# Patient Record
Sex: Female | Born: 1935 | Race: White | Hispanic: No | Smoking: Former smoker
Health system: Southern US, Community
[De-identification: ages and names within clinical notes are randomized; demographics above are authoritative.]

## PROBLEM LIST (undated history)

## (undated) DIAGNOSIS — I509 Heart failure, unspecified: Secondary | ICD-10-CM

## (undated) DIAGNOSIS — J449 Chronic obstructive pulmonary disease, unspecified: Secondary | ICD-10-CM

## (undated) DIAGNOSIS — C801 Malignant (primary) neoplasm, unspecified: Secondary | ICD-10-CM

## (undated) DIAGNOSIS — E119 Type 2 diabetes mellitus without complications: Secondary | ICD-10-CM

## (undated) DIAGNOSIS — I499 Cardiac arrhythmia, unspecified: Secondary | ICD-10-CM

## (undated) DIAGNOSIS — I10 Essential (primary) hypertension: Secondary | ICD-10-CM

## (undated) HISTORY — PX: APPENDECTOMY: SHX54

## (undated) HISTORY — PX: CHOLECYSTECTOMY: SHX55

## (undated) HISTORY — DX: Cardiac arrhythmia, unspecified: I49.9

## (undated) HISTORY — PX: ABDOMINAL SURGERY: SHX537

## (undated) HISTORY — DX: Heart failure, unspecified: I50.9

## (undated) HISTORY — PX: BREAST SURGERY: SHX581

## (undated) HISTORY — PX: BACK SURGERY: SHX140

---

## 2002-12-27 ENCOUNTER — Encounter: Payer: Self-pay | Admitting: Neurosurgery

## 2002-12-27 ENCOUNTER — Encounter: Admission: RE | Admit: 2002-12-27 | Discharge: 2002-12-27 | Payer: Self-pay | Admitting: Neurosurgery

## 2003-01-31 ENCOUNTER — Encounter: Payer: Self-pay | Admitting: Neurosurgery

## 2003-02-03 ENCOUNTER — Ambulatory Visit (HOSPITAL_COMMUNITY): Admission: RE | Admit: 2003-02-03 | Discharge: 2003-02-04 | Payer: Self-pay | Admitting: Neurosurgery

## 2003-02-28 ENCOUNTER — Encounter: Admission: RE | Admit: 2003-02-28 | Discharge: 2003-02-28 | Payer: Self-pay | Admitting: Neurosurgery

## 2003-07-23 ENCOUNTER — Other Ambulatory Visit: Payer: Self-pay

## 2004-01-31 ENCOUNTER — Ambulatory Visit: Payer: Self-pay | Admitting: Anesthesiology

## 2004-02-07 ENCOUNTER — Ambulatory Visit: Payer: Self-pay | Admitting: Anesthesiology

## 2004-02-15 ENCOUNTER — Ambulatory Visit: Payer: Self-pay | Admitting: Cardiology

## 2004-02-27 ENCOUNTER — Ambulatory Visit: Payer: Self-pay | Admitting: Anesthesiology

## 2004-03-20 ENCOUNTER — Ambulatory Visit: Payer: Self-pay

## 2004-03-23 ENCOUNTER — Ambulatory Visit: Payer: Self-pay | Admitting: Gastroenterology

## 2004-03-26 ENCOUNTER — Ambulatory Visit: Payer: Self-pay | Admitting: Internal Medicine

## 2004-03-28 ENCOUNTER — Ambulatory Visit: Payer: Self-pay | Admitting: Anesthesiology

## 2004-04-29 ENCOUNTER — Emergency Department: Payer: Self-pay | Admitting: Emergency Medicine

## 2004-06-04 ENCOUNTER — Ambulatory Visit: Payer: Self-pay | Admitting: Gerontology

## 2004-07-24 ENCOUNTER — Ambulatory Visit: Payer: Self-pay | Admitting: Urology

## 2004-09-20 ENCOUNTER — Ambulatory Visit: Payer: Self-pay | Admitting: Unknown Physician Specialty

## 2004-09-25 ENCOUNTER — Ambulatory Visit: Payer: Self-pay | Admitting: Ophthalmology

## 2004-11-23 ENCOUNTER — Other Ambulatory Visit: Payer: Self-pay

## 2004-12-03 ENCOUNTER — Inpatient Hospital Stay: Payer: Self-pay | Admitting: Unknown Physician Specialty

## 2004-12-07 ENCOUNTER — Encounter: Payer: Self-pay | Admitting: Internal Medicine

## 2005-04-03 ENCOUNTER — Ambulatory Visit: Payer: Self-pay | Admitting: Internal Medicine

## 2005-07-12 ENCOUNTER — Emergency Department: Payer: Self-pay | Admitting: Emergency Medicine

## 2005-07-31 ENCOUNTER — Other Ambulatory Visit: Payer: Self-pay

## 2005-07-31 ENCOUNTER — Inpatient Hospital Stay: Payer: Self-pay | Admitting: Internal Medicine

## 2005-08-20 ENCOUNTER — Ambulatory Visit: Payer: Self-pay | Admitting: Internal Medicine

## 2005-10-28 ENCOUNTER — Inpatient Hospital Stay: Payer: Self-pay | Admitting: Unknown Physician Specialty

## 2006-01-24 ENCOUNTER — Inpatient Hospital Stay: Payer: Self-pay | Admitting: Internal Medicine

## 2006-01-24 ENCOUNTER — Other Ambulatory Visit: Payer: Self-pay

## 2006-01-30 ENCOUNTER — Other Ambulatory Visit: Payer: Self-pay

## 2006-02-06 ENCOUNTER — Inpatient Hospital Stay: Payer: Self-pay | Admitting: Unknown Physician Specialty

## 2006-02-11 ENCOUNTER — Encounter: Payer: Self-pay | Admitting: Internal Medicine

## 2006-02-14 ENCOUNTER — Ambulatory Visit: Payer: Self-pay | Admitting: Internal Medicine

## 2006-03-08 ENCOUNTER — Encounter: Payer: Self-pay | Admitting: Internal Medicine

## 2006-04-08 ENCOUNTER — Encounter: Payer: Self-pay | Admitting: Internal Medicine

## 2006-05-21 ENCOUNTER — Ambulatory Visit: Payer: Self-pay | Admitting: Internal Medicine

## 2006-10-06 ENCOUNTER — Ambulatory Visit: Payer: Self-pay | Admitting: Unknown Physician Specialty

## 2007-05-15 ENCOUNTER — Other Ambulatory Visit: Payer: Self-pay

## 2007-05-15 ENCOUNTER — Emergency Department: Payer: Self-pay | Admitting: Emergency Medicine

## 2007-06-01 ENCOUNTER — Ambulatory Visit: Payer: Self-pay | Admitting: Internal Medicine

## 2007-06-28 ENCOUNTER — Emergency Department: Payer: Self-pay | Admitting: Emergency Medicine

## 2007-07-22 ENCOUNTER — Ambulatory Visit: Payer: Self-pay | Admitting: Internal Medicine

## 2007-07-23 ENCOUNTER — Emergency Department: Payer: Self-pay | Admitting: Unknown Physician Specialty

## 2007-07-23 ENCOUNTER — Other Ambulatory Visit: Payer: Self-pay

## 2007-08-25 ENCOUNTER — Emergency Department: Payer: Self-pay | Admitting: Emergency Medicine

## 2007-09-04 ENCOUNTER — Ambulatory Visit: Payer: Self-pay | Admitting: Urology

## 2007-09-23 ENCOUNTER — Ambulatory Visit: Payer: Self-pay | Admitting: Cardiology

## 2007-11-17 ENCOUNTER — Emergency Department: Payer: Self-pay | Admitting: Emergency Medicine

## 2007-11-17 ENCOUNTER — Other Ambulatory Visit: Payer: Self-pay

## 2008-05-08 ENCOUNTER — Emergency Department: Payer: Self-pay | Admitting: Emergency Medicine

## 2008-06-22 ENCOUNTER — Ambulatory Visit: Payer: Self-pay | Admitting: Unknown Physician Specialty

## 2008-06-28 ENCOUNTER — Inpatient Hospital Stay: Payer: Self-pay | Admitting: Unknown Physician Specialty

## 2008-07-01 ENCOUNTER — Encounter: Payer: Self-pay | Admitting: Internal Medicine

## 2008-07-07 ENCOUNTER — Encounter: Payer: Self-pay | Admitting: Internal Medicine

## 2008-09-07 ENCOUNTER — Ambulatory Visit: Payer: Self-pay | Admitting: Internal Medicine

## 2008-11-23 ENCOUNTER — Ambulatory Visit: Payer: Self-pay | Admitting: Orthopedic Surgery

## 2008-12-21 ENCOUNTER — Ambulatory Visit: Payer: Self-pay | Admitting: Pain Medicine

## 2009-01-04 ENCOUNTER — Ambulatory Visit: Payer: Self-pay | Admitting: Physician Assistant

## 2009-01-17 ENCOUNTER — Ambulatory Visit: Payer: Self-pay | Admitting: Pain Medicine

## 2009-04-22 ENCOUNTER — Inpatient Hospital Stay: Payer: Self-pay | Admitting: Internal Medicine

## 2009-05-21 ENCOUNTER — Emergency Department: Payer: Self-pay | Admitting: Emergency Medicine

## 2009-07-05 ENCOUNTER — Emergency Department: Payer: Self-pay | Admitting: Emergency Medicine

## 2009-09-12 ENCOUNTER — Ambulatory Visit: Payer: Self-pay | Admitting: Internal Medicine

## 2009-11-22 ENCOUNTER — Inpatient Hospital Stay: Payer: Self-pay | Admitting: Family Medicine

## 2010-02-23 ENCOUNTER — Observation Stay: Payer: Self-pay | Admitting: Rheumatology

## 2010-04-22 ENCOUNTER — Emergency Department: Payer: Self-pay | Admitting: Emergency Medicine

## 2010-07-17 ENCOUNTER — Ambulatory Visit: Payer: Self-pay | Admitting: Internal Medicine

## 2010-07-31 ENCOUNTER — Emergency Department: Payer: Self-pay | Admitting: Emergency Medicine

## 2010-09-15 ENCOUNTER — Inpatient Hospital Stay: Payer: Self-pay | Admitting: Internal Medicine

## 2011-01-11 ENCOUNTER — Ambulatory Visit: Payer: Self-pay | Admitting: Family Medicine

## 2011-02-17 ENCOUNTER — Emergency Department: Payer: Self-pay | Admitting: Emergency Medicine

## 2011-06-28 ENCOUNTER — Observation Stay: Payer: Self-pay | Admitting: Family Medicine

## 2011-06-28 LAB — CBC
HCT: 37.6 % (ref 35.0–47.0)
HGB: 12.2 g/dL (ref 12.0–16.0)
MCH: 29.1 pg (ref 26.0–34.0)
MCHC: 32.5 g/dL (ref 32.0–36.0)
MCV: 90 fL (ref 80–100)
Platelet: 184 10*3/uL (ref 150–440)
RBC: 4.18 10*6/uL (ref 3.80–5.20)
RDW: 16.8 % — ABNORMAL HIGH (ref 11.5–14.5)
WBC: 9.6 10*3/uL (ref 3.6–11.0)

## 2011-06-28 LAB — COMPREHENSIVE METABOLIC PANEL
Albumin: 3.3 g/dL — ABNORMAL LOW (ref 3.4–5.0)
Alkaline Phosphatase: 85 U/L (ref 50–136)
Anion Gap: 11 (ref 7–16)
BUN: 17 mg/dL (ref 7–18)
Bilirubin,Total: 0.8 mg/dL (ref 0.2–1.0)
Calcium, Total: 9.7 mg/dL (ref 8.5–10.1)
Chloride: 106 mmol/L (ref 98–107)
Co2: 24 mmol/L (ref 21–32)
Creatinine: 1.12 mg/dL (ref 0.60–1.30)
Glucose: 130 mg/dL — ABNORMAL HIGH (ref 65–99)
Osmolality: 285 (ref 275–301)
Potassium: 3.9 mmol/L (ref 3.5–5.1)
SGOT(AST): 38 U/L — ABNORMAL HIGH (ref 15–37)
SGPT (ALT): 23 U/L
Sodium: 141 mmol/L (ref 136–145)
Total Protein: 6.9 g/dL (ref 6.4–8.2)

## 2011-06-28 LAB — CK TOTAL AND CKMB (NOT AT ARMC)
CK, Total: 34 U/L (ref 21–215)
CK-MB: 0.6 ng/mL (ref 0.5–3.6)

## 2011-06-28 LAB — TROPONIN I
Troponin-I: <0.02 ng/mL
Troponin-I: <0.02 ng/mL

## 2011-06-29 LAB — LIPID PANEL
Cholesterol: 133 mg/dL (ref 0–200)
HDL Cholesterol: 44 mg/dL (ref 40–60)
Ldl Cholesterol, Calc: 63 mg/dL (ref 0–100)
Triglycerides: 130 mg/dL (ref 0–200)
VLDL Cholesterol, Calc: 26 mg/dL (ref 5–40)

## 2011-06-29 LAB — TROPONIN I: Troponin-I: <0.02 ng/mL

## 2011-06-29 LAB — TSH: Thyroid Stimulating Horm: 2 u[IU]/mL

## 2011-07-01 LAB — CBC WITH DIFFERENTIAL/PLATELET
Basophil #: 0 10*3/uL (ref 0.0–0.1)
Basophil %: 0.5 %
Eosinophil #: 0.2 10*3/uL (ref 0.0–0.7)
Eosinophil %: 2.2 %
HCT: 41.1 % (ref 35.0–47.0)
HGB: 13.3 g/dL (ref 12.0–16.0)
Lymphocyte #: 3.4 10*3/uL (ref 1.0–3.6)
Lymphocyte %: 33.1 %
MCH: 28.9 pg (ref 26.0–34.0)
MCHC: 32.3 g/dL (ref 32.0–36.0)
MCV: 89 fL (ref 80–100)
Monocyte #: 0.8 10*3/uL — ABNORMAL HIGH (ref 0.0–0.7)
Monocyte %: 7.9 %
Neutrophil #: 5.8 10*3/uL (ref 1.4–6.5)
Neutrophil %: 56.3 %
Platelet: 188 10*3/uL (ref 150–440)
RBC: 4.6 10*6/uL (ref 3.80–5.20)
RDW: 16.7 % — ABNORMAL HIGH (ref 11.5–14.5)
WBC: 10.3 10*3/uL (ref 3.6–11.0)

## 2011-07-01 LAB — BASIC METABOLIC PANEL
Anion Gap: 11 (ref 7–16)
BUN: 31 mg/dL — ABNORMAL HIGH (ref 7–18)
Calcium, Total: 9.5 mg/dL (ref 8.5–10.1)
Chloride: 100 mmol/L (ref 98–107)
Co2: 28 mmol/L (ref 21–32)
Creatinine: 1.32 mg/dL — ABNORMAL HIGH (ref 0.60–1.30)
EGFR (African American): 50 — ABNORMAL LOW
EGFR (Non-African Amer.): 41 — ABNORMAL LOW
Glucose: 140 mg/dL — ABNORMAL HIGH (ref 65–99)
Osmolality: 286 (ref 275–301)
Potassium: 3.4 mmol/L — ABNORMAL LOW (ref 3.5–5.1)
Sodium: 139 mmol/L (ref 136–145)

## 2011-07-31 ENCOUNTER — Ambulatory Visit: Payer: Self-pay | Admitting: Family Medicine

## 2012-02-04 ENCOUNTER — Ambulatory Visit: Payer: Self-pay | Admitting: Family Medicine

## 2012-03-20 ENCOUNTER — Ambulatory Visit: Payer: Self-pay | Admitting: Family Medicine

## 2012-05-06 ENCOUNTER — Emergency Department: Payer: Self-pay | Admitting: Emergency Medicine

## 2012-05-06 LAB — COMPREHENSIVE METABOLIC PANEL
Albumin: 3.4 g/dL (ref 3.4–5.0)
Alkaline Phosphatase: 116 U/L (ref 50–136)
Anion Gap: 7 (ref 7–16)
BUN: 11 mg/dL (ref 7–18)
Bilirubin,Total: 0.8 mg/dL (ref 0.2–1.0)
Calcium, Total: 9.1 mg/dL (ref 8.5–10.1)
Chloride: 106 mmol/L (ref 98–107)
Co2: 27 mmol/L (ref 21–32)
Creatinine: 1.06 mg/dL (ref 0.60–1.30)
EGFR (African American): 58 — ABNORMAL LOW
EGFR (Non-African Amer.): 50 — ABNORMAL LOW
Glucose: 130 mg/dL — ABNORMAL HIGH (ref 65–99)
Osmolality: 281 (ref 275–301)
Potassium: 3.7 mmol/L (ref 3.5–5.1)
SGOT(AST): 21 U/L (ref 15–37)
SGPT (ALT): 18 U/L (ref 12–78)
Sodium: 140 mmol/L (ref 136–145)
Total Protein: 6.9 g/dL (ref 6.4–8.2)

## 2012-05-06 LAB — CBC
HCT: 41 % (ref 35.0–47.0)
HGB: 13.4 g/dL (ref 12.0–16.0)
MCH: 29.1 pg (ref 26.0–34.0)
MCHC: 32.8 g/dL (ref 32.0–36.0)
MCV: 89 fL (ref 80–100)
Platelet: 290 10*3/uL (ref 150–440)
RBC: 4.62 10*6/uL (ref 3.80–5.20)
RDW: 17.3 % — ABNORMAL HIGH (ref 11.5–14.5)
WBC: 9.6 10*3/uL (ref 3.6–11.0)

## 2012-05-06 LAB — PRO B NATRIURETIC PEPTIDE: B-Type Natriuretic Peptide: 148 pg/mL (ref 0–450)

## 2012-05-06 LAB — TROPONIN I: Troponin-I: <0.02 ng/mL

## 2012-05-06 LAB — CK TOTAL AND CKMB (NOT AT ARMC)
CK, Total: 27 U/L (ref 21–215)
CK-MB: <0.5 ng/mL — ABNORMAL LOW (ref 0.5–3.6)

## 2012-05-12 ENCOUNTER — Encounter: Payer: Self-pay | Admitting: Family Medicine

## 2012-12-08 ENCOUNTER — Emergency Department: Payer: Self-pay | Admitting: Emergency Medicine

## 2012-12-08 LAB — COMPREHENSIVE METABOLIC PANEL
Albumin: 3.2 g/dL — ABNORMAL LOW (ref 3.4–5.0)
Alkaline Phosphatase: 125 U/L (ref 50–136)
Anion Gap: 7 (ref 7–16)
BUN: 12 mg/dL (ref 7–18)
Bilirubin,Total: 0.5 mg/dL (ref 0.2–1.0)
Calcium, Total: 9.6 mg/dL (ref 8.5–10.1)
Chloride: 105 mmol/L (ref 98–107)
Co2: 28 mmol/L (ref 21–32)
Creatinine: 0.97 mg/dL (ref 0.60–1.30)
EGFR (African American): >60
EGFR (Non-African Amer.): 55 — ABNORMAL LOW
Glucose: 74 mg/dL (ref 65–99)
Osmolality: 278 (ref 275–301)
Potassium: 3.6 mmol/L (ref 3.5–5.1)
SGOT(AST): 19 U/L (ref 15–37)
SGPT (ALT): 17 U/L (ref 12–78)
Sodium: 140 mmol/L (ref 136–145)
Total Protein: 6.9 g/dL (ref 6.4–8.2)

## 2012-12-08 LAB — CBC WITH DIFFERENTIAL/PLATELET
Basophil #: 0.1 10*3/uL (ref 0.0–0.1)
Basophil %: 0.7 %
Eosinophil #: 0.2 10*3/uL (ref 0.0–0.7)
Eosinophil %: 1.8 %
HCT: 37.2 % (ref 35.0–47.0)
HGB: 12.6 g/dL (ref 12.0–16.0)
Lymphocyte #: 2.7 10*3/uL (ref 1.0–3.6)
Lymphocyte %: 26.4 %
MCH: 28.8 pg (ref 26.0–34.0)
MCHC: 33.7 g/dL (ref 32.0–36.0)
MCV: 86 fL (ref 80–100)
Monocyte #: 0.7 x10 3/mm (ref 0.2–0.9)
Monocyte %: 6.9 %
Neutrophil #: 6.7 10*3/uL — ABNORMAL HIGH (ref 1.4–6.5)
Neutrophil %: 64.2 %
Platelet: 222 10*3/uL (ref 150–440)
RBC: 4.35 10*6/uL (ref 3.80–5.20)
RDW: 17.6 % — ABNORMAL HIGH (ref 11.5–14.5)
WBC: 10.4 10*3/uL (ref 3.6–11.0)

## 2012-12-08 LAB — URINALYSIS, COMPLETE
Bilirubin,UR: NEGATIVE
Blood: NEGATIVE
Glucose,UR: NEGATIVE mg/dL (ref 0–75)
Hyaline Cast: 5
Ketone: NEGATIVE
Leukocyte Esterase: NEGATIVE
Nitrite: NEGATIVE
Ph: 5 (ref 4.5–8.0)
Protein: NEGATIVE
RBC,UR: NONE SEEN /HPF (ref 0–5)
Specific Gravity: 1.017 (ref 1.003–1.030)
Squamous Epithelial: 2
WBC UR: 2 /HPF (ref 0–5)

## 2013-01-11 ENCOUNTER — Emergency Department: Payer: Self-pay | Admitting: Emergency Medicine

## 2013-01-11 LAB — CBC
HCT: 38.8 % (ref 35.0–47.0)
HGB: 12.7 g/dL (ref 12.0–16.0)
MCH: 28.4 pg (ref 26.0–34.0)
MCHC: 32.8 g/dL (ref 32.0–36.0)
MCV: 87 fL (ref 80–100)
Platelet: 191 10*3/uL (ref 150–440)
RBC: 4.47 10*6/uL (ref 3.80–5.20)
RDW: 17.8 % — ABNORMAL HIGH (ref 11.5–14.5)
WBC: 11.5 10*3/uL — ABNORMAL HIGH (ref 3.6–11.0)

## 2013-01-11 LAB — COMPREHENSIVE METABOLIC PANEL
Albumin: 3.5 g/dL (ref 3.4–5.0)
Alkaline Phosphatase: 106 U/L (ref 50–136)
Anion Gap: 6 — ABNORMAL LOW (ref 7–16)
BUN: 17 mg/dL (ref 7–18)
Bilirubin,Total: 0.7 mg/dL (ref 0.2–1.0)
Calcium, Total: 9.8 mg/dL (ref 8.5–10.1)
Chloride: 108 mmol/L — ABNORMAL HIGH (ref 98–107)
Co2: 26 mmol/L (ref 21–32)
Creatinine: 1.29 mg/dL (ref 0.60–1.30)
EGFR (African American): 45 — ABNORMAL LOW
EGFR (Non-African Amer.): 39 — ABNORMAL LOW
Glucose: 108 mg/dL — ABNORMAL HIGH (ref 65–99)
Osmolality: 281 (ref 275–301)
Potassium: 4.7 mmol/L (ref 3.5–5.1)
SGOT(AST): 28 U/L (ref 15–37)
SGPT (ALT): 18 U/L (ref 12–78)
Sodium: 140 mmol/L (ref 136–145)
Total Protein: 6.7 g/dL (ref 6.4–8.2)

## 2013-01-11 LAB — PROTIME-INR
INR: 1
Prothrombin Time: 13.7 secs (ref 11.5–14.7)

## 2013-01-20 ENCOUNTER — Other Ambulatory Visit (HOSPITAL_COMMUNITY): Payer: Self-pay | Admitting: Internal Medicine

## 2013-01-20 DIAGNOSIS — IMO0002 Reserved for concepts with insufficient information to code with codable children: Secondary | ICD-10-CM

## 2013-01-20 DIAGNOSIS — M549 Dorsalgia, unspecified: Secondary | ICD-10-CM

## 2013-01-27 ENCOUNTER — Ambulatory Visit (HOSPITAL_COMMUNITY)
Admission: RE | Admit: 2013-01-27 | Discharge: 2013-01-27 | Disposition: A | Discharge status: Home / Self Care | Payer: Medicare Other | Source: Ambulatory Visit | Attending: Internal Medicine | Admitting: Internal Medicine

## 2013-01-27 DIAGNOSIS — IMO0002 Reserved for concepts with insufficient information to code with codable children: Secondary | ICD-10-CM

## 2013-01-27 DIAGNOSIS — M549 Dorsalgia, unspecified: Secondary | ICD-10-CM

## 2013-02-04 ENCOUNTER — Ambulatory Visit: Payer: Self-pay | Admitting: Family Medicine

## 2013-02-15 ENCOUNTER — Other Ambulatory Visit (HOSPITAL_COMMUNITY): Payer: Self-pay | Admitting: Interventional Radiology

## 2013-02-15 ENCOUNTER — Other Ambulatory Visit: Payer: Self-pay | Admitting: Radiology

## 2013-02-15 DIAGNOSIS — S22000A Wedge compression fracture of unspecified thoracic vertebra, initial encounter for closed fracture: Secondary | ICD-10-CM

## 2013-02-16 ENCOUNTER — Ambulatory Visit (HOSPITAL_COMMUNITY)
Admission: RE | Admit: 2013-02-16 | Discharge: 2013-02-16 | Disposition: A | Discharge status: Home / Self Care | Payer: Medicare Other | Source: Ambulatory Visit | Attending: Interventional Radiology | Admitting: Interventional Radiology

## 2013-02-16 ENCOUNTER — Inpatient Hospital Stay (HOSPITAL_COMMUNITY)
Admission: RE | Admit: 2013-02-16 | Discharge: 2013-02-16 | Disposition: A | Discharge status: Home / Self Care | Payer: Medicare Other | Source: Ambulatory Visit | Attending: Interventional Radiology | Admitting: Interventional Radiology

## 2013-02-16 DIAGNOSIS — M8448XA Pathological fracture, other site, initial encounter for fracture: Secondary | ICD-10-CM | POA: Insufficient documentation

## 2013-02-16 DIAGNOSIS — S22000A Wedge compression fracture of unspecified thoracic vertebra, initial encounter for closed fracture: Secondary | ICD-10-CM

## 2013-02-16 DIAGNOSIS — M5124 Other intervertebral disc displacement, thoracic region: Secondary | ICD-10-CM | POA: Insufficient documentation

## 2013-02-16 DIAGNOSIS — M4804 Spinal stenosis, thoracic region: Secondary | ICD-10-CM | POA: Insufficient documentation

## 2013-10-21 ENCOUNTER — Ambulatory Visit: Payer: Self-pay | Admitting: Cardiology

## 2014-03-01 ENCOUNTER — Ambulatory Visit: Payer: Self-pay | Admitting: Family Medicine

## 2014-04-02 ENCOUNTER — Emergency Department: Payer: Self-pay | Admitting: Emergency Medicine

## 2014-04-02 LAB — BASIC METABOLIC PANEL
Anion Gap: 5 — ABNORMAL LOW (ref 7–16)
BUN: 13 mg/dL (ref 7–18)
CHLORIDE: 102 mmol/L (ref 98–107)
CO2: 30 mmol/L (ref 21–32)
Calcium, Total: 9.4 mg/dL (ref 8.5–10.1)
Creatinine: 1.09 mg/dL (ref 0.60–1.30)
EGFR (African American): >60
EGFR (Non-African Amer.): 51 — ABNORMAL LOW
GLUCOSE: 143 mg/dL — AB (ref 65–99)
Osmolality: 276 (ref 275–301)
Potassium: 3.8 mmol/L (ref 3.5–5.1)
SODIUM: 137 mmol/L (ref 136–145)

## 2014-04-02 LAB — CBC
HCT: 37.3 % (ref 35.0–47.0)
HGB: 11.9 g/dL — ABNORMAL LOW (ref 12.0–16.0)
MCH: 28.3 pg (ref 26.0–34.0)
MCHC: 31.9 g/dL — ABNORMAL LOW (ref 32.0–36.0)
MCV: 89 fL (ref 80–100)
Platelet: 233 10*3/uL (ref 150–440)
RBC: 4.2 10*6/uL (ref 3.80–5.20)
RDW: 17.4 % — ABNORMAL HIGH (ref 11.5–14.5)
WBC: 8.3 10*3/uL (ref 3.6–11.0)

## 2014-04-02 LAB — TROPONIN I: Troponin-I: <0.02 ng/mL

## 2014-07-19 DIAGNOSIS — E780 Pure hypercholesterolemia: Secondary | ICD-10-CM | POA: Diagnosis not present

## 2014-07-19 DIAGNOSIS — I10 Essential (primary) hypertension: Secondary | ICD-10-CM | POA: Diagnosis not present

## 2014-07-19 DIAGNOSIS — E119 Type 2 diabetes mellitus without complications: Secondary | ICD-10-CM | POA: Diagnosis not present

## 2014-07-31 NOTE — H&P (Signed)
PATIENT NAME:  Alexa Lowe, MANTON MR#:  [id number] DATE OF BIRTH:  [birth year]  DATE OF ADMISSION:  06/28/2011  PRIMARY CARE PHYSICIAN: Dion Body, MD   REFERRING PHYSICIAN:   Alger Simons, MD    CHIEF COMPLAINT: Chest pain today.   HISTORY OF PRESENT ILLNESS: The patient is an 81 year old Caucasian female with a history of hypertension, diabetes, coronary artery disease, CVA, chronic obstructive pulmonary disease, osteoarthritis, gastroesophageal reflux disease, gout, depression and anxiety, who presented to the ED with chest pain on the left side since 1:00 p.m. today. The patient is alert, awake, oriented, in no acute distress. She stated that she suddenly had chest pain on the left side when she was watching TV, and the pain is a sharp 6 out of 10 with radiation to the left shoulder and arm with arm numbness. She also has mild shortness of breath but denies any palpitations, orthopnea, or nocturnal dyspnea. She has diffuse bilateral leg swelling on and off and tenderness. She denies any fever or chills. No cough, sputum, or hemoptysis She feels a little bit of nausea but no abdominal pain, vomiting, or diarrhea, no melena. She said she had a cardiac catheterization about five years ago. There was some stenosis, about 40%. She had a stress test about 2 to 3 years ago in her primary care physician's office. The patient is living alone, usually uses a  wheelchair.   PAST MEDICAL HISTORY: As mentioned above.   PAST SURGICAL HISTORY:  1. Bilateral knee replacement.  2. Cholecystectomy.  3. Appendectomy.  4. Tonsillectomy.  5. Hysterectomy.  6. Back surgery x6.  7. Neck surgery   SOCIAL HISTORY: She denies any smoking, drinking, or illicit drugs.   FAMILY HISTORY: Breast cancer. Father had a myocardial infarction. Mother had renal and colon cancer and lung cancer. Sister had breast and ovary cancer.   ALLERGIES: ACE inhibitor, codeine, Dimetapp, erythromycin, morphine, Sinequan.     MEDICATIONS: Please refer to the medication reconciliation list. .   REVIEW OF SYSTEMS: CONSTITUTIONAL: The patient denies any fever or chills. No headache or dizziness. No weight loss. EYES: No double vision, blurred vision or glaucoma. ENT: No postnasal drip, epistaxis, no discharge, no dysphagia. RESPIRATORY: No cough, sputum, shortness of breath, or hematemesis. GASTROINTESTINAL: No abdominal pain, nausea, vomiting, or diarrhea. CARDIOVASCULAR: Positive for chest pain, but no orthopnea, nocturnal dyspnea or edema. GENITOURINARY: No dysuria or hematuria. SKIN: No rash or jaundice. HEMATOLOGY: No bleeding or easy bruising. NEUROLOGICAL: No syncope, loss of consciousness or seizure. PSYCHIATRIC: No depression, no anxiety.   PHYSICAL EXAMINATION:  VITALS: Temperature 96.7, blood pressure 143/68, pulse 72, respirations 18, oxygen saturation 94% on room air.   GENERAL: The patient is alert, awake, oriented, in no acute distress.   HEENT: Pupils are round, equal, and reactive to light and accommodation. Moist oral mucosa. Clear oropharynx.   NECK: Supple, no JVD or carotid bruits. No lymphadenopathy. No thyromegaly.   CARDIOVASCULAR: S1, S2, regular rate and rhythm. No murmurs or gallops.   PULMONARY: Bilateral air entry. No wheezing or rales. No use of muscles to breathe.   ABDOMEN: Soft, no distention or tenderness. No organomegaly. Bowel sounds are present.   EXTREMITIES: No edema, clubbing, or cyanosis. No calf tenderness. Strong bilateral pedal pulses.   NEUROLOGICAL: Alert and oriented x3. No focal deficit. Power five out of five. Sensation intact. Deep tendon reflexes 2+.   LABORATORY, DIAGNOSTIC AND RADIOLOGICAL DATA:  Venous duplex showed no deep venous thrombosis of bilateral legs.  CK 34,  CK-MB 0.6. Troponin less than 0.02. CBC normal. Glucose 130, BUN 17, creatinine 1.12. Electrolytes are normal.  Chest x-ray is unremarkable.  EKG shows sinus rhythm with occasional premature  ventricular contractions, left axis deviation.    IMPRESSION:  1. Chest pain.  2. Coronary artery disease.  3. Hypertension. 4. Diabetes.  5. Chronic obstructive pulmonary disease. 6. Gastroesophageal reflux disease.  7. History of cerebrovascular accident.  8. Osteoarthritis. 9. Gout.   PLAN OF TREATMENT:  1. The patient will be placed for observation. We will continue telemetry monitor, O2 by nasal cannula. We will follow up troponin level x2. We will check lipid panel, TSH.  2. We will get a stress test and a Cardiology consult from [facility].  3. We will give aspirin 325 mg and continue Lipitor and also Lopressor 25 mg p.o. b.i.d. No ACE inhibitor due to allergy. Continue Lasix and continue home medication.  4. GI and deep vein thrombosis prophylaxis.   I discussed the patient's situation and the plan of treatment with the patient. She voiced understanding and agreed to the current treatment plan.   TIME SPENT: About 60 minutes.  ____________________________ Demetrios Loll, MD qc:cbb D: 06/28/2011 19:24:17 ET T: 06/29/2011 08:05:16 ET JOB#: [id number] cc: Demetrios Loll, MD, <Dictator> Dion Body, MD Demetrios Loll MD ELECTRONICALLY SIGNED 06/29/2011 14:21

## 2014-07-31 NOTE — Discharge Summary (Signed)
PATIENT NAME:  Alexa Lowe, Alexa Lowe MR#:  [id number] DATE OF BIRTH:  Nov 11, 1930  DATE OF ADMISSION:  06/28/2011 DATE OF DISCHARGE:  07/01/2011  DISCHARGE DIAGNOSES:  1. Chest discomfort, resolved.  2. Adult onset diabetes.  3. Hypertension.  4. Hyperlipidemia.  5. Osteoarthritis.  6. Anxiety/depression. 7. Mild chronic obstructive pulmonary disease.  8. History of gout.   DISCHARGE MEDICATIONS:  1. Metformin 500 mg p.o. b.i.d.  2. Nabumetone 500 mg p.o. b.i.d.  3. Allopurinol 300 mg p.o. daily.  4. Pantoprazole 40 mg p.o. daily.  5. Lyrica 50 mg p.o. t.i.d.  6. Centrum Silver 1 tab daily.  7. Zantac 150 mg p.o. daily.  8. Albuterol 90-mcg inhaler, 2 puffs every four hours p.r.n. for wheezing.  9. Citalopram 20 mg 1-1/2 tabs p.o. daily.  10. Lipitor 40 mg p.o. at bedtime.  11. Losartan/HCTZ 50/12.5 mg p.o. daily.  12. Aspirin 325 mg p.o. daily.   CONSULTS: Dr. Clayborn Bigness, cardiology.  PROCEDURES: Patient underwent a Myoview. Results are still pending.   PERTINENT LABS DURING HOSPITAL STAY: Cardiac enzymes were negative times three. On the day of discharge sodium 139, potassium 3.4, BUN 31, creatinine 1.32, glucose 140, TSH 2, white blood cell count 10.3, hemoglobin 13.3, platelets 188, calcium 9.5, HDL 44, total cholesterol 133, triglycerides 130, LDL 63.   BRIEF HOSPITAL COURSE:  1. Chest discomfort. The patient initially came in with complaints of chest discomfort radiating to the left arm. EKG did not show any significant changes. Cardiac enzymes were negative times three. Cardiology was consulted and did not recommend invasive procedure per Dr. Clayborn Bigness. A Myoview was initially ordered and completed but the results are still pending upon discharge and will need to be followed up as an outpatient. The patient was symptom-free upon discharge. She does have multiple risk factors and may need cardiology followup as an outpatient if symptoms persist.  2. Hypertension remained stable during  her hospital stay. We will start back on her home regimen of losartan/HCTZ 50/12.5 mg p.o. daily. No need for beta blocker at this time.  If she has any abnormalities on the Myoview may consider adding the beta blocker.  3. Other chronic medical issues remained stable. Continue home regimen.   DISPOSITION: She is in stable condition and will be discharged to home.   FOLLOWUP: Follow up with Dr. Netty Starring in 1 to 2 weeks.   ____________________________ Dion Body, MD kl:bjt D: 07/01/2011 16:42:48 ET T: 07/02/2011 13:34:07 ET JOB#: [id number]  cc: Dion Body, MD, <Dictator> Dion Body MD ELECTRONICALLY SIGNED 07/03/2011 16:47

## 2014-07-31 NOTE — Consult Note (Signed)
Chief Complaint:   Subjective/Chief Complaint Pt states improved symoms but still has some sob/cp. weakness.   VITAL SIGNS/ANCILLARY NOTES: **Vital Signs.:   24-Mar-13 13:59   Temperature Temperature (F) 95.7   Celsius 35.3   Temperature Source axillary   Pulse Pulse 66   Pulse source per Dinamap   Respirations Respirations 20   Systolic BP Systolic BP 008   Diastolic BP (mmHg) Diastolic BP (mmHg) 81   Mean BP 95   BP Source Dinamap   Pulse Ox % Pulse Ox % 95   Pulse Ox Activity Level  At rest   Oxygen Delivery Room Air/ 21 %  *Intake and Output.:   Daily 24-Mar-13 07:00   Grand Totals Intake:  120 Output:      Net:  120 24 Hr.:  120   Oral Intake      In:  120   Length of Stay Totals Intake:  720 Output:      Net:  720   Brief Assessment:   Cardiac Regular  -- LE edema  -- JVD    Respiratory normal resp effort  clear BS  rhonchi    Gastrointestinal Normal    Gastrointestinal details normal Soft  Nontender  Nondistended  No masses palpable   Blood Glucose:  24-Mar-13 07:23    POCT Blood Glucose 102    11:29    POCT Blood Glucose 184    16:37    POCT Blood Glucose 147    20:48    POCT Blood Glucose 254   Radiology Results: XRay:    22-Mar-13 14:56, Chest PA and Lateral   Chest PA and Lateral    REASON FOR EXAM:    Chest Pain  COMMENTS:       PROCEDURE: DXR - DXR CHEST PA (OR AP) AND LATERAL  - Jun 28 2011  2:56PM     RESULT: Comparison is made to the study of September 14, 2010.    The lungs are adequately inflated and clear. The cardiac silhouette is   normal in size. The pulmonary vascularity is not engorged. The   mediastinum is normal in width. There is mild tortuosity of the   descending thoracic aorta. The visualized portions of the thoracic spine   exhibit no compression fracture.    IMPRESSION:  I do not see evidence of acute cardiopulmonary abnormality.      Verified By: DAVID A. Martinique, M.D., MD  [place]:    22-Mar-13 17:13, [place] Color Flow Doppler  Low Extrem Bilat   [place] Color Flow Doppler Low Extrem Bilat    REASON FOR EXAM:    bilat pain swelling  COMMENTS:       PROCEDURE: [place]  - US DOPPLER LOW EXTR BILATERAL  - Jun 28 2011  5:13PM     RESULT: The right and left femoral and popliteal veins are normally   compressible. The waveform patterns are normal and the color flow images   are normal. The response to the augmentation and Valsalva maneuvers is   normal.    IMPRESSION:  I see no evidence of thrombus within the right or left   femoral or popliteal veins.          Verified By: DAVID A. Martinique, M.D., MD   Assessment/Plan:  Invasive Device Daily Assessment of Necessity:   Does the patient currently have any of the following indwelling devices? none   Assessment/Plan:   Assessment IMP CP Angina SOB HTN CAD DM COPD CVA Gout .  Plan PLAN Tele ROMI F/U ekgs ASA Agree with ECHO DM control Bp control Myoview in am I do not rec cath Statin therapy Increase activity Inhalers for sob and copd   Electronic Signatures: Lujean Amel D (MD)  (Signed 25-Mar-13 11:04)  Authored: Chief Complaint, VITAL SIGNS/ANCILLARY NOTES, Brief Assessment, Lab Results, Radiology Results, Assessment/Plan   Last Updated: 25-Mar-13 11:04 by Yolonda Kida (MD)

## 2014-07-31 NOTE — Consult Note (Signed)
PATIENT NAME:  Alexa Lowe, Alexa Lowe MR#:  [id number] DATE OF BIRTH:  [birth year]  DATE OF CONSULTATION:  06/29/2011  REFERRING PHYSICIAN:  Dr. Berdie Ogren Finnell/[facility]  CONSULTING PHYSICIAN:  Jenaye Rickert D. Shakai Dolley, MD  INDICATION: Chest pain.   HISTORY OF PRESENT ILLNESS: Alexa Lowe is a 81 year old with history of hypertension, diabetes, coronary disease, cerebrovascular accident, chronic obstructive pulmonary disease, osteoarthritis, reflux, gout, depression, and anxiety who presented to the Emergency Room with left-sided chest pain that awoke her from sleep. She had chest pain on the left side while watching TV, 6 out of 10, left shoulder numbness. She had mild shortness of breath. Denied palpitations or orthopnea. She had diffuse bilateral leg swelling on and off. Denies any fever or chills. No cough or hemoptysis. She had a cardiac catheterization about five years ago. There was some stenosis of about 40% but she was treated medically. She had a stress test about 2 to 3 years ago and preliminary results were okay. She lives alone. Occasionally uses a wheelchair. With the chest pain she came to the Emergency Room and was admitted.  REVIEW OF SYSTEMS: No blackout spells or syncope. No nausea or vomiting. No fever, chills, or sweats. Denies weight loss, weight gain. No hemoptysis, hematemesis. Denies bright red blood per rectum. No vision change or hearing change. Denies sputum production or cough.   PAST SURGICAL HISTORY:  1. Bilateral knee surgery. 2. Cholecystectomy. 3. Appendectomy.  4. Tonsillectomy.  5. Hysterectomy.  6. Back surgery x6.   PAST MEDICAL HISTORY:  1. Chest pain. 2. Angina. 3. Hypertension. 4. Cerebrovascular accident. 5. Chronic obstructive pulmonary disease. 6. Osteoarthritis. 7. Coronary disease.  8. Diabetes.   FAMILY HISTORY: Breast cancer, myocardial infarction, renal and colon cancer, breast cancer, ovarian cancer.   SOCIAL HISTORY: No smoking or  alcohol consumption. Lives alone.   ALLERGIES: ACE inhibitors, codeine, Dimetapp, erythromycin, morphine, Sinequan.   MEDICATIONS: The patient did not provide a list.   PHYSICAL EXAMINATION:   VITAL SIGNS: Blood pressure 143/68, pulse 72, respiratory rate 18, afebrile.   HEENT: Normocephalic, atraumatic. Pupils equal and reactive to light.   NECK: Supple. No significant JVD, bruits, or adenopathy.   LUNGS: Clear to auscultation and percussion. No significant wheeze, rhonchi, or rales.   HEART: Regular rate and rhythm.   ABDOMEN: Positive bowel sounds. No rebound, guarding, or tenderness.   EXTREMITIES: No cyanosis, clubbing, or edema.   NEUROLOGIC: Intact.   SKIN: Normal.   LABORATORY, DIAGNOSTIC, AND RADIOLOGICAL DATA: Venous Doppler's of the legs were negative. CK 34. MB 0.6. Troponin less than 0.02. CBC normal. Glucose 130, BUN 17, creatinine 1.12. Electrolytes were normal. Chest x-ray negative. EKG normal sinus rhythm, occasional PVCs, left deviation, otherwise normal.   ASSESSMENT:  1. Chest pain. 2. Coronary disease. 3. Hypertension. 4. Diabetes. 5. Chronic obstructive pulmonary disease. 6. Gastroesophageal reflux disease. 7. Cerebrovascular accident. 8. Osteoarthritis. 9. Gout.   PLAN:  1. Agree with admit.  2. Rule out for myocardial infarction.  3. Place on telemetry.  4. Follow-up EKGs.  5. Follow-up cardiac enzymes.  6. Consider functional study.  7. Continue aspirin.  8. Continue statin therapy.  9. Continue blood pressure control.  10. Continue diabetes management.  11. No ACE inhibitor therapy because of allergy. 12. Lasix as needed.  13. Continue reflux medications.  14. Symptoms are fairly atypical and she has had mild to moderate coronary artery disease in the past. Hopefully further work-up will show that things are no different and we can  treat her medically and hopefully have her discharged shortly.   ____________________________ Loran Senters.  Clayborn Bigness, MD ddc:drc D: 07/01/2011 16:30:41 ET T: 07/01/2011 17:19:02 ET JOB#: [id number]  cc: Merry Pond D. Clayborn Bigness, MD, <Dictator> Yolonda Kida MD ELECTRONICALLY SIGNED 07/17/2011 11:41

## 2014-08-05 DIAGNOSIS — E119 Type 2 diabetes mellitus without complications: Secondary | ICD-10-CM | POA: Diagnosis not present

## 2014-08-05 DIAGNOSIS — M545 Low back pain: Secondary | ICD-10-CM | POA: Diagnosis not present

## 2014-08-05 DIAGNOSIS — I10 Essential (primary) hypertension: Secondary | ICD-10-CM | POA: Diagnosis not present

## 2014-08-05 DIAGNOSIS — E780 Pure hypercholesterolemia: Secondary | ICD-10-CM | POA: Diagnosis not present

## 2014-09-09 DIAGNOSIS — C50112 Malignant neoplasm of central portion of left female breast: Secondary | ICD-10-CM | POA: Diagnosis not present

## 2014-09-09 DIAGNOSIS — Z4432 Encounter for fitting and adjustment of external left breast prosthesis: Secondary | ICD-10-CM | POA: Diagnosis not present

## 2014-10-28 ENCOUNTER — Emergency Department
Admission: EM | Admit: 2014-10-28 | Discharge: 2014-10-29 | Disposition: A | Discharge status: Home / Self Care | Payer: Commercial Managed Care - HMO | Attending: Emergency Medicine | Admitting: Emergency Medicine

## 2014-10-28 ENCOUNTER — Emergency Department: Payer: Commercial Managed Care - HMO

## 2014-10-28 ENCOUNTER — Encounter: Payer: Self-pay | Admitting: Emergency Medicine

## 2014-10-28 DIAGNOSIS — R51 Headache: Secondary | ICD-10-CM | POA: Diagnosis not present

## 2014-10-28 DIAGNOSIS — E119 Type 2 diabetes mellitus without complications: Secondary | ICD-10-CM | POA: Insufficient documentation

## 2014-10-28 DIAGNOSIS — Z87891 Personal history of nicotine dependence: Secondary | ICD-10-CM | POA: Insufficient documentation

## 2014-10-28 DIAGNOSIS — I10 Essential (primary) hypertension: Secondary | ICD-10-CM | POA: Insufficient documentation

## 2014-10-28 DIAGNOSIS — Z79899 Other long term (current) drug therapy: Secondary | ICD-10-CM | POA: Diagnosis not present

## 2014-10-28 DIAGNOSIS — G311 Senile degeneration of brain, not elsewhere classified: Secondary | ICD-10-CM | POA: Diagnosis not present

## 2014-10-28 DIAGNOSIS — Z7982 Long term (current) use of aspirin: Secondary | ICD-10-CM | POA: Diagnosis not present

## 2014-10-28 DIAGNOSIS — M47812 Spondylosis without myelopathy or radiculopathy, cervical region: Secondary | ICD-10-CM | POA: Diagnosis not present

## 2014-10-28 DIAGNOSIS — R519 Headache, unspecified: Secondary | ICD-10-CM

## 2014-10-28 HISTORY — DX: Type 2 diabetes mellitus without complications: E11.9

## 2014-10-28 HISTORY — DX: Malignant (primary) neoplasm, unspecified: C80.1

## 2014-10-28 HISTORY — DX: Chronic obstructive pulmonary disease, unspecified: J44.9

## 2014-10-28 HISTORY — DX: Essential (primary) hypertension: I10

## 2014-10-28 LAB — BASIC METABOLIC PANEL
Anion gap: 8 (ref 5–15)
BUN: 19 mg/dL (ref 6–20)
CALCIUM: 9 mg/dL (ref 8.9–10.3)
CHLORIDE: 103 mmol/L (ref 101–111)
CO2: 28 mmol/L (ref 22–32)
CREATININE: 1.06 mg/dL — AB (ref 0.44–1.00)
GFR calc Af Amer: 56 mL/min — ABNORMAL LOW (ref >60)
GFR calc non Af Amer: 48 mL/min — ABNORMAL LOW (ref >60)
Glucose, Bld: 103 mg/dL — ABNORMAL HIGH (ref 65–99)
Potassium: 4.1 mmol/L (ref 3.5–5.1)
SODIUM: 139 mmol/L (ref 135–145)

## 2014-10-28 LAB — CBC WITH DIFFERENTIAL/PLATELET
BASOS PCT: 1 %
Basophils Absolute: 0.1 10*3/uL (ref 0–0.1)
EOS ABS: 0.2 10*3/uL (ref 0–0.7)
Eosinophils Relative: 2 %
HEMATOCRIT: 35 % (ref 35.0–47.0)
Hemoglobin: 11.1 g/dL — ABNORMAL LOW (ref 12.0–16.0)
LYMPHS PCT: 34 %
Lymphs Abs: 2.7 10*3/uL (ref 1.0–3.6)
MCH: 26 pg (ref 26.0–34.0)
MCHC: 31.8 g/dL — ABNORMAL LOW (ref 32.0–36.0)
MCV: 81.8 fL (ref 80.0–100.0)
MONOS PCT: 7 %
Monocytes Absolute: 0.6 10*3/uL (ref 0.2–0.9)
NEUTROS ABS: 4.6 10*3/uL (ref 1.4–6.5)
NEUTROS PCT: 56 %
Platelets: 213 10*3/uL (ref 150–440)
RBC: 4.28 MIL/uL (ref 3.80–5.20)
RDW: 18.3 % — ABNORMAL HIGH (ref 11.5–14.5)
WBC: 8.2 10*3/uL (ref 3.6–11.0)

## 2014-10-28 LAB — SEDIMENTATION RATE: Sed Rate: 41 mm/hr — ABNORMAL HIGH (ref 0–30)

## 2014-10-28 MED ORDER — MAGNESIUM SULFATE 2 GM/50ML IV SOLN: 2.0000 g | Freq: Once | INTRAVENOUS | Status: DC

## 2014-10-28 NOTE — ED Notes (Signed)
Phone given for loved one called

## 2014-10-28 NOTE — ED Provider Notes (Signed)
[facility] Emergency Department Provider Note  ____________________________________________  Time seen: Approximately 9:47 PM  I have reviewed the triage vital signs and the nursing notes.   HISTORY  Chief Complaint Headache    HPI Alexa Lowe is a 81 y.o. female who has been having occasional shooting headaches over the right scalp which last anywhere from a few seconds to 10 minutes when described as a burning sharp pain. There is no pain in her eye. She's had previous headaches which are somewhat similar but never lasted this long. She denies any change in vision or eye pain. The pain is located about the area of her right ear where she had fallen and hit a wheelchair 6 weeks ago without loss of consciousness or obvious injury except for some swelling which is slowly going down, and shoots up over the right scalp.  She does have occasional nausea when the pain is severe. Presently she has no pain, but states it will sometimes, and just flareup for a few minutes and then goes away.  No fever. No pain with eating or chewing with the jaw. No fevers no neck pain or stiffness except for her chronic arthritis like pain since having a previous spine surgery.  No numbness or tingling. No weakness in her arms or legs. No difficulty with walking or moving. No trouble with speech and no slurring, and no weakness in her face or drooping.    Past Medical History  Diagnosis Date  . COPD (chronic obstructive pulmonary disease)   . Cancer     Right breast  . Diabetes mellitus without complication   . Hypertension     There are no active problems to display for this patient.   Past Surgical History  Procedure Laterality Date  . Abdominal surgery    . Appendectomy    . Breast surgery    . Cholecystectomy    . Back surgery      Current Outpatient Rx  Name  Route  Sig  Dispense  Refill  . allopurinol (ZYLOPRIM) 300 MG tablet   Oral   Take 300 mg by mouth  daily.         Marland Kitchen aspirin EC 325 MG tablet   Oral   Take 325 mg by mouth daily.         . benzonatate (TESSALON) 100 MG capsule   Oral   Take 100 mg by mouth 3 (three) times daily as needed for cough.         . citalopram (CELEXA) 20 MG tablet   Oral   Take 20 mg by mouth daily.         . hydrOXYzine (ATARAX/VISTARIL) 25 MG tablet   Oral   Take 25 mg by mouth 3 (three) times daily as needed for anxiety.         Marland Kitchen losartan-hydrochlorothiazide (HYZAAR) 50-12.5 MG per tablet   Oral   Take 0.5 tablets by mouth daily.         . meclizine (ANTIVERT) 25 MG tablet   Oral   Take 25 mg by mouth 3 (three) times daily as needed for dizziness.         . metFORMIN (GLUCOPHAGE) 500 MG tablet   Oral   Take 500 mg by mouth 2 (two) times daily.          . pantoprazole (PROTONIX) 40 MG tablet   Oral   Take 40 mg by mouth daily.         Marland Kitchen  pregabalin (LYRICA) 50 MG capsule   Oral   Take 50 mg by mouth 2 (two) times daily.         . ranitidine (ZANTAC) 150 MG tablet   Oral   Take 150 mg by mouth daily.         . traMADol (ULTRAM) 50 MG tablet   Oral   Take 100 mg by mouth at bedtime. Pt also uses every four hours if needed for pain.           Allergies Morphine and related and Codeine  History reviewed. No pertinent family history.  Social History History  Substance Use Topics  . Smoking status: Former Research scientist (life sciences)  . Smokeless tobacco: Not on file  . Alcohol Use: Not on file    Review of Systems Constitutional: No fever/chills Eyes: No visual changes, though she did feel like she has some fuzzy vision after falling 6 weeks ago. No eye pain. Uses glasses and seeing normally now. ENT: No sore throat. Cardiovascular: Denies chest pain. Respiratory: Denies shortness of breath. Gastrointestinal: No abdominal pain.  No vomiting.  No diarrhea.  No constipation. Genitourinary: Negative for dysuria. Musculoskeletal: Negative for back pain. Skin: Negative for  rash. Neurological: Negative for  focal weakness or numbness.  10-point ROS otherwise negative.  ____________________________________________   PHYSICAL EXAM:  VITAL SIGNS: ED Triage Vitals  Enc Vitals Group     BP 10/28/14 1856 124/59 mmHg     Pulse Rate 10/28/14 1856 68     Resp 10/28/14 1856 20     Temp 10/28/14 1856 97.9 F (36.6 C)     Temp Source 10/28/14 1856 Oral     SpO2 10/28/14 1856 98 %     Weight 10/28/14 1856 220 lb (99.791 kg)     Height 10/28/14 1856 $RemoveBefor'5\' 3"'[id number]$  (1.6 m)     Head Cir --      Peak Flow --      Pain Score 10/28/14 1859 6     Pain Loc --      Pain Edu? --      Excl. in [place]? --     Constitutional: Alert and oriented. Well appearing and in no acute distress. Eyes: Conjunctivae are normal. PERRL. EOMI. Head: Atraumatic except for some very mild swelling over the right temporal scalp just anterior to the right ear. There is some focal tenderness in the area of the origin of the right temporal artery, though there is also some very slight edema over this region with the patient states she fell and hit her head. She has normal temporal artery pulse to palpation. Nose: No congestion/rhinnorhea. Patient reads fingers normally with glasses on with each eye. She has a previous cataract repair on the right. There is no conjunctival injection or erythema. There is no tenderness to palpation across the eyeball. The patient denies any pain in her eyeball. Normal ocular exam with exception of postsurgical changes.  Mouth/Throat: Mucous membranes are moist.  Oropharynx non-erythematous. Neck: No stridor.  No cervical tenderness. Does have some mild tenderness at the base of the right occiput which radiates pain up. No meningismus. Cardiovascular: Normal rate, regular rhythm. Grossly normal heart sounds.  Good peripheral circulation. Respiratory: Normal respiratory effort.  No retractions. Lungs CTAB. Gastrointestinal: Soft and nontender. No distention. No abdominal  bruits. No CVA tenderness. Musculoskeletal: No lower extremity tenderness nor edema.  No joint effusions. Neurologic:  Normal speech and language. No gross focal neurologic deficits are appreciated. No gait instability. Normal cranial  nerve exam. Skin:  Skin is warm, dry and intact. No rash noted. Psychiatric: Mood and affect are normal. Speech and behavior are normal.  ____________________________________________   LABS (all labs ordered are listed, but only abnormal results are displayed)  Labs Reviewed  CBC WITH DIFFERENTIAL/PLATELET - Abnormal; Notable for the following:    Hemoglobin 11.1 (*)    MCHC 31.8 (*)    RDW 18.3 (*)    All other components within normal limits  BASIC METABOLIC PANEL - Abnormal; Notable for the following:    Glucose, Bld 103 (*)    Creatinine, Ser 1.06 (*)    GFR calc non Af Amer 48 (*)    GFR calc Af Amer 56 (*)    All other components within normal limits  SEDIMENTATION RATE   ____________________________________________  EKG   ____________________________________________  RADIOLOGY  CT Head Wo Contrast (Final result) Result time: 10/28/14 19:29:48   Final result by Rad Results In Interface (10/28/14 19:29:48)   Narrative:   CLINICAL DATA: Headache for 6 weeks since a fall, worsening. Acute worsening of headache 10/27/2014 with nausea. Initial encounter.  EXAM: CT HEAD WITHOUT CONTRAST  CT CERVICAL SPINE WITHOUT CONTRAST  TECHNIQUE: Multidetector CT imaging of the head and cervical spine was performed following the standard protocol without intravenous contrast. Multiplanar CT image reconstructions of the cervical spine were also generated.  COMPARISON: CT cervical spine 09/15/2010. Head CT scan 12/08/2012.  FINDINGS: CT HEAD FINDINGS  There is some cortical atrophy and chronic microvascular ischemic change. No evidence of acute abnormality including infarct, hemorrhage, mass lesion, mass effect, midline shift or  abnormal extra-axial fluid collection is identified. No hydrocephalus or pneumocephalus. The calvarium is intact. Imaged paranasal sinuses and mastoid air cells are clear.  CT CERVICAL SPINE FINDINGS  The patient is status post C5-7 anterior discectomy and fusion. The levels are solidly fused and hardware is intact. There is no fracture or malalignment. Scattered facet degenerative change is noted. Degenerative changes also seen at the C1-2 articulation. The lung apices are clear.  IMPRESSION: No acute abnormality head or cervical spine.  Mild appearing atrophy and chronic microvascular ischemic change.  Status post C5-7 fusion without evidence of complication.   Electronically Signed By: Inge Rise M.D. On: 10/28/2014 19:29          CT Cervical Spine Wo Contrast (Final result) Result time: 10/28/14 19:29:48   Final result by Rad Results In Interface (10/28/14 19:29:48)   Narrative:   CLINICAL DATA: Headache for 6 weeks since a fall, worsening. Acute worsening of headache 10/27/2014 with nausea. Initial encounter.  EXAM: CT HEAD WITHOUT CONTRAST  CT CERVICAL SPINE WITHOUT CONTRAST  TECHNIQUE: Multidetector CT imaging of the head and cervical spine was performed following the standard protocol without intravenous contrast. Multiplanar CT image reconstructions of the cervical spine were also generated.  COMPARISON: CT cervical spine 09/15/2010. Head CT scan 12/08/2012.  FINDINGS: CT HEAD FINDINGS  There is some cortical atrophy and chronic microvascular ischemic change. No evidence of acute abnormality including infarct, hemorrhage, mass lesion, mass effect, midline shift or abnormal extra-axial fluid collection is identified. No hydrocephalus or pneumocephalus. The calvarium is intact. Imaged paranasal sinuses and mastoid air cells are clear.  CT CERVICAL SPINE FINDINGS  The patient is status post C5-7 anterior discectomy and fusion.  The levels are solidly fused and hardware is intact. There is no fracture or malalignment. Scattered facet degenerative change is noted. Degenerative changes also seen at the C1-2 articulation. The lung apices are clear.  IMPRESSION: No acute abnormality head or cervical spine.  Mild appearing atrophy and chronic microvascular ischemic change.  Status post C5-7 fusion without evidence of complication.      ____________________________________________   PROCEDURES  Procedure(s) performed: None  Critical Care performed: No  ____________________________________________   INITIAL IMPRESSION / ASSESSMENT AND PLAN / ED COURSE  Pertinent labs & imaging results that were available during my care of the patient were reviewed by me and considered in my medical decision making (see chart for details).  Patient presents with intermittent sharp headaches radiating up from the region of the right ear where she fell about 6 weeks ago. She does not have any fever. She has no symptomatology to suggest giant cell arteritis except for some mild tenderness which is more likely due to previous fall as to my assessment. Alternative considerations would be something like a trigeminal type of pain, however she doesn't very well fit the description but given the trauma to the area is possible.  I discussed with Dr. Irish Elders, should the patient have an elevated ESR greater than 40 he recommends treating with steroid burst (initiate at 38m Qday) follow-up with neurology and rheumatology for potential though still somewhat unlikely giant cell arteritis. Dr. ZIrish Eldersand I were unable to find any other acute emergent condition which would precipitate this headache at this time. I agree and think this is a reasonable plan.  There is no evidence of stroke, aneurysm, meningitis, tumor. No risk factors to precipitate something such as sinus thrombosis. No neurologic  abnormalities.  ----------------------------------------- 10:52 PM on 10/28/2014 -----------------------------------------  Patient offered magnesium infusion for headache, but refusing pain medicine at present. She is already on prescription tramadol for pain at home which she has been using. In addition she is already on Lyrica.  ----------------------------------------- 10:55 PM on 10/28/2014 -----------------------------------------  Discussed with Dr. SDineen Kidplan of care is to follow up on ESR. ESR elevated at 40 or greater would recommend treating and referring for possible giant cell arteritis, otherwise recommend discharge to follow up with neurology to continue her home pain medications including tramadol and Lyrica which she is already on.  I also discussed this plan with the patient prior to my departure and she agrees to follow-up as recommended. Return precautions advised. ____________________________________________   FINAL CLINICAL IMPRESSION(S) / ED DIAGNOSES  Final diagnoses:  Headache, unspecified headache type      MDelman Kitten MD 10/28/14 2256

## 2014-10-28 NOTE — ED Notes (Signed)
Pt reports she doesn't want the IV or meds

## 2014-10-28 NOTE — Discharge Instructions (Signed)
General Headache Without Cause  Please follow-up with neurology as soon as possible.  You have been seen in the Emergency Department (ED) for a headache.    As we have discussed, please follow up with your primary care doctor as soon as possible regarding todays Emergency Department (ED) visit and your headache symptoms.    Call your doctor or return to the ED if you have a worsening headache, sudden and severe headache, confusion, slurred speech, facial droop, weakness or numbness in any arm or leg, extreme fatigue, vision problems, or other symptoms that concern you.   A headache is pain or discomfort felt around the head or neck area. The specific cause of a headache may not be found. There are many causes and types of headaches. A few common ones are:  Tension headaches.  Migraine headaches.  Cluster headaches.  Chronic daily headaches. HOME CARE INSTRUCTIONS   Keep all follow-up appointments with your caregiver or any specialist referral.  Only take over-the-counter or prescription medicines for pain or discomfort as directed by your caregiver.  Lie down in a dark, quiet room when you have a headache.  Keep a headache journal to find out what may trigger your migraine headaches. For example, write down:  What you eat and drink.  How much sleep you get.  Any change to your diet or medicines.  Try massage or other relaxation techniques.  Put ice packs or heat on the head and neck. Use these 3 to 4 times per day for 15 to 20 minutes each time, or as needed.  Limit stress.  Sit up straight, and do not tense your muscles.  Quit smoking if you smoke.  Limit alcohol use.  Decrease the amount of caffeine you drink, or stop drinking caffeine.  Eat and sleep on a regular schedule.  Get 7 to 9 hours of sleep, or as recommended by your caregiver.  Keep lights dim if bright lights bother you and make your headaches worse. SEEK MEDICAL CARE IF:   You have problems  with the medicines you were prescribed.  Your medicines are not working.  You have a change from the usual headache.  You have nausea or vomiting. SEEK IMMEDIATE MEDICAL CARE IF:   Your headache becomes severe.  You have a fever.  You have a stiff neck.  You have loss of vision.  You have muscular weakness or loss of muscle control.  You start losing your balance or have trouble walking.  You feel faint or pass out.  You have severe symptoms that are different from your first symptoms. MAKE SURE YOU:   Understand these instructions.  Will watch your condition.  Will get help right away if you are not doing well or get worse. Document Released: 03/25/2005 Document Revised: 06/17/2011 Document Reviewed: 04/10/2011 [facility] Patient Information 2015 [place], [place]. This information is not intended to replace advice given to you by your health care provider. Make sure you discuss any questions you have with your health care provider.

## 2014-10-28 NOTE — ED Notes (Signed)
Patient presents to the ED with a headache x 6 weeks.  Patient reports falling prior to the headaches becoming worse.  Patient reports last night she had a very sharp pain to the right side of her head and felt nauseous afterward.  Patient states she had headaches before she fell but this seems worse.  Patient states headache is worse when she moves her head to the right.  Patient is alert and oriented x 4.  Patient denies dizziness.  Reports increased lethargy.

## 2014-10-29 MED ORDER — PREDNISONE 20 MG PO TABS: 40.0000 mg | ORAL_TABLET | Freq: Every day | ORAL | Status: DC

## 2014-10-29 NOTE — ED Notes (Signed)
Patient with no complaints at this time. Respirations even and unlabored. Skin warm/dry. Discharge instructions reviewed with patient at this time. Patient given opportunity to voice concerns/ask questions. Patient discharged at this time and left Emergency Department, via wheelchair.   

## 2014-10-29 NOTE — ED Provider Notes (Signed)
-----------------------------------------   12:32 AM on 10/29/2014 -----------------------------------------  The patient's ESR has returned at 41. We will refer the patient to [facility] rheumatology for further workup and evaluation. We will also place the patient on a 5 day steroid burst. I discussed results with the patient, and she is agreeable to this plan. Patient is asking to be discharged home at this time.  Harvest Dark, MD 10/29/14 [phone]

## 2015-02-20 DIAGNOSIS — I10 Essential (primary) hypertension: Secondary | ICD-10-CM | POA: Diagnosis not present

## 2015-02-20 DIAGNOSIS — M545 Low back pain: Secondary | ICD-10-CM | POA: Diagnosis not present

## 2015-02-20 DIAGNOSIS — G8929 Other chronic pain: Secondary | ICD-10-CM | POA: Diagnosis not present

## 2015-02-20 DIAGNOSIS — E119 Type 2 diabetes mellitus without complications: Secondary | ICD-10-CM | POA: Diagnosis not present

## 2015-02-20 DIAGNOSIS — E7800 Pure hypercholesterolemia, unspecified: Secondary | ICD-10-CM | POA: Diagnosis not present

## 2015-02-24 DIAGNOSIS — E7800 Pure hypercholesterolemia, unspecified: Secondary | ICD-10-CM | POA: Diagnosis not present

## 2015-02-24 DIAGNOSIS — E119 Type 2 diabetes mellitus without complications: Secondary | ICD-10-CM | POA: Diagnosis not present

## 2015-02-24 DIAGNOSIS — Z0000 Encounter for general adult medical examination without abnormal findings: Secondary | ICD-10-CM | POA: Diagnosis not present

## 2015-02-24 DIAGNOSIS — I10 Essential (primary) hypertension: Secondary | ICD-10-CM | POA: Diagnosis not present

## 2015-02-24 DIAGNOSIS — M109 Gout, unspecified: Secondary | ICD-10-CM | POA: Diagnosis not present

## 2015-07-04 DIAGNOSIS — J40 Bronchitis, not specified as acute or chronic: Secondary | ICD-10-CM | POA: Diagnosis not present

## 2015-07-04 DIAGNOSIS — J208 Acute bronchitis due to other specified organisms: Secondary | ICD-10-CM | POA: Diagnosis not present

## 2015-07-04 DIAGNOSIS — B9689 Other specified bacterial agents as the cause of diseases classified elsewhere: Secondary | ICD-10-CM | POA: Diagnosis not present

## 2015-08-21 DIAGNOSIS — E119 Type 2 diabetes mellitus without complications: Secondary | ICD-10-CM | POA: Diagnosis not present

## 2015-08-21 DIAGNOSIS — E7800 Pure hypercholesterolemia, unspecified: Secondary | ICD-10-CM | POA: Diagnosis not present

## 2015-08-21 DIAGNOSIS — M109 Gout, unspecified: Secondary | ICD-10-CM | POA: Diagnosis not present

## 2015-08-21 DIAGNOSIS — I10 Essential (primary) hypertension: Secondary | ICD-10-CM | POA: Diagnosis not present

## 2015-08-24 DIAGNOSIS — M109 Gout, unspecified: Secondary | ICD-10-CM | POA: Diagnosis not present

## 2015-08-24 DIAGNOSIS — E7800 Pure hypercholesterolemia, unspecified: Secondary | ICD-10-CM | POA: Diagnosis not present

## 2015-08-24 DIAGNOSIS — E119 Type 2 diabetes mellitus without complications: Secondary | ICD-10-CM | POA: Diagnosis not present

## 2015-08-24 DIAGNOSIS — M150 Primary generalized (osteo)arthritis: Secondary | ICD-10-CM | POA: Diagnosis not present

## 2015-08-24 DIAGNOSIS — I10 Essential (primary) hypertension: Secondary | ICD-10-CM | POA: Diagnosis not present

## 2015-10-23 DIAGNOSIS — F329 Major depressive disorder, single episode, unspecified: Secondary | ICD-10-CM | POA: Diagnosis not present

## 2015-10-23 DIAGNOSIS — R1033 Periumbilical pain: Secondary | ICD-10-CM | POA: Diagnosis not present

## 2015-10-23 DIAGNOSIS — F419 Anxiety disorder, unspecified: Secondary | ICD-10-CM | POA: Diagnosis not present

## 2016-01-30 DIAGNOSIS — E119 Type 2 diabetes mellitus without complications: Secondary | ICD-10-CM | POA: Diagnosis not present

## 2016-02-19 DIAGNOSIS — M109 Gout, unspecified: Secondary | ICD-10-CM | POA: Diagnosis not present

## 2016-02-19 DIAGNOSIS — I10 Essential (primary) hypertension: Secondary | ICD-10-CM | POA: Diagnosis not present

## 2016-02-19 DIAGNOSIS — E119 Type 2 diabetes mellitus without complications: Secondary | ICD-10-CM | POA: Diagnosis not present

## 2016-02-19 DIAGNOSIS — E7800 Pure hypercholesterolemia, unspecified: Secondary | ICD-10-CM | POA: Diagnosis not present

## 2016-02-26 DIAGNOSIS — I10 Essential (primary) hypertension: Secondary | ICD-10-CM | POA: Diagnosis not present

## 2016-02-26 DIAGNOSIS — E7800 Pure hypercholesterolemia, unspecified: Secondary | ICD-10-CM | POA: Diagnosis not present

## 2016-02-26 DIAGNOSIS — E119 Type 2 diabetes mellitus without complications: Secondary | ICD-10-CM | POA: Diagnosis not present

## 2016-02-26 DIAGNOSIS — Z23 Encounter for immunization: Secondary | ICD-10-CM | POA: Diagnosis not present

## 2016-02-26 DIAGNOSIS — G250 Essential tremor: Secondary | ICD-10-CM | POA: Diagnosis not present

## 2016-02-26 DIAGNOSIS — J449 Chronic obstructive pulmonary disease, unspecified: Secondary | ICD-10-CM | POA: Diagnosis not present

## 2016-02-26 DIAGNOSIS — Z6841 Body Mass Index (BMI) 40.0 and over, adult: Secondary | ICD-10-CM | POA: Diagnosis not present

## 2016-03-08 DIAGNOSIS — E1142 Type 2 diabetes mellitus with diabetic polyneuropathy: Secondary | ICD-10-CM | POA: Diagnosis not present

## 2016-03-08 DIAGNOSIS — Z6841 Body Mass Index (BMI) 40.0 and over, adult: Secondary | ICD-10-CM | POA: Diagnosis not present

## 2016-03-08 DIAGNOSIS — G250 Essential tremor: Secondary | ICD-10-CM | POA: Diagnosis not present

## 2016-04-09 DIAGNOSIS — M542 Cervicalgia: Secondary | ICD-10-CM | POA: Diagnosis not present

## 2016-04-09 DIAGNOSIS — M25511 Pain in right shoulder: Secondary | ICD-10-CM | POA: Diagnosis not present

## 2016-04-09 DIAGNOSIS — M19011 Primary osteoarthritis, right shoulder: Secondary | ICD-10-CM | POA: Diagnosis not present

## 2016-04-09 DIAGNOSIS — M47812 Spondylosis without myelopathy or radiculopathy, cervical region: Secondary | ICD-10-CM | POA: Diagnosis not present

## 2016-08-20 DIAGNOSIS — E7800 Pure hypercholesterolemia, unspecified: Secondary | ICD-10-CM | POA: Diagnosis not present

## 2016-08-20 DIAGNOSIS — E119 Type 2 diabetes mellitus without complications: Secondary | ICD-10-CM | POA: Diagnosis not present

## 2016-08-20 DIAGNOSIS — I10 Essential (primary) hypertension: Secondary | ICD-10-CM | POA: Diagnosis not present

## 2016-08-27 DIAGNOSIS — E119 Type 2 diabetes mellitus without complications: Secondary | ICD-10-CM | POA: Diagnosis not present

## 2016-08-27 DIAGNOSIS — E7800 Pure hypercholesterolemia, unspecified: Secondary | ICD-10-CM | POA: Diagnosis not present

## 2016-08-27 DIAGNOSIS — I10 Essential (primary) hypertension: Secondary | ICD-10-CM | POA: Diagnosis not present

## 2016-10-14 DIAGNOSIS — H2513 Age-related nuclear cataract, bilateral: Secondary | ICD-10-CM | POA: Diagnosis not present

## 2017-02-06 ENCOUNTER — Emergency Department: Payer: Medicare HMO

## 2017-02-06 ENCOUNTER — Encounter: Payer: Self-pay | Admitting: Emergency Medicine

## 2017-02-06 ENCOUNTER — Emergency Department
Admission: EM | Admit: 2017-02-06 | Discharge: 2017-02-06 | Disposition: A | Discharge status: Home / Self Care | Payer: Medicare HMO | Attending: Emergency Medicine | Admitting: Emergency Medicine

## 2017-02-06 DIAGNOSIS — Z9049 Acquired absence of other specified parts of digestive tract: Secondary | ICD-10-CM | POA: Insufficient documentation

## 2017-02-06 DIAGNOSIS — Z853 Personal history of malignant neoplasm of breast: Secondary | ICD-10-CM | POA: Diagnosis not present

## 2017-02-06 DIAGNOSIS — R05 Cough: Secondary | ICD-10-CM | POA: Diagnosis not present

## 2017-02-06 DIAGNOSIS — Z7982 Long term (current) use of aspirin: Secondary | ICD-10-CM | POA: Insufficient documentation

## 2017-02-06 DIAGNOSIS — Z87891 Personal history of nicotine dependence: Secondary | ICD-10-CM | POA: Insufficient documentation

## 2017-02-06 DIAGNOSIS — I10 Essential (primary) hypertension: Secondary | ICD-10-CM | POA: Diagnosis not present

## 2017-02-06 DIAGNOSIS — Z79899 Other long term (current) drug therapy: Secondary | ICD-10-CM | POA: Insufficient documentation

## 2017-02-06 DIAGNOSIS — E119 Type 2 diabetes mellitus without complications: Secondary | ICD-10-CM | POA: Insufficient documentation

## 2017-02-06 DIAGNOSIS — R0789 Other chest pain: Secondary | ICD-10-CM | POA: Diagnosis not present

## 2017-02-06 DIAGNOSIS — R079 Chest pain, unspecified: Secondary | ICD-10-CM | POA: Diagnosis not present

## 2017-02-06 DIAGNOSIS — J449 Chronic obstructive pulmonary disease, unspecified: Secondary | ICD-10-CM | POA: Diagnosis not present

## 2017-02-06 DIAGNOSIS — Z7984 Long term (current) use of oral hypoglycemic drugs: Secondary | ICD-10-CM | POA: Diagnosis not present

## 2017-02-06 LAB — CBC
HEMATOCRIT: 31.6 % — AB (ref 35.0–47.0)
HEMOGLOBIN: 10 g/dL — AB (ref 12.0–16.0)
MCH: 25.9 pg — AB (ref 26.0–34.0)
MCHC: 31.6 g/dL — AB (ref 32.0–36.0)
MCV: 82 fL (ref 80.0–100.0)
Platelets: 211 10*3/uL (ref 150–440)
RBC: 3.86 MIL/uL (ref 3.80–5.20)
RDW: 18.9 % — AB (ref 11.5–14.5)
WBC: 7.4 10*3/uL (ref 3.6–11.0)

## 2017-02-06 LAB — BASIC METABOLIC PANEL
ANION GAP: 5 (ref 5–15)
BUN: 23 mg/dL — AB (ref 6–20)
CO2: 25 mmol/L (ref 22–32)
Calcium: 8.8 mg/dL — ABNORMAL LOW (ref 8.9–10.3)
Chloride: 107 mmol/L (ref 101–111)
Creatinine, Ser: 1.05 mg/dL — ABNORMAL HIGH (ref 0.44–1.00)
GFR calc Af Amer: 55 mL/min — ABNORMAL LOW (ref >60)
GFR calc non Af Amer: 47 mL/min — ABNORMAL LOW (ref >60)
GLUCOSE: 206 mg/dL — AB (ref 65–99)
POTASSIUM: 3.8 mmol/L (ref 3.5–5.1)
Sodium: 137 mmol/L (ref 135–145)

## 2017-02-06 LAB — TROPONIN I: Troponin I: <0.03 ng/mL (ref <0.03)

## 2017-02-06 NOTE — ED Triage Notes (Signed)
Pt to ED via EMS from [place] living with c/o CP x3wks. Per EMS pt took Asprin this am. PT VS stable, NAD noted . NSR

## 2017-02-06 NOTE — ED Provider Notes (Signed)
[facility] Emergency Department Provider Note  Time seen: 4:24 PM  I have reviewed the triage vital signs and the nursing notes.   HISTORY  Chief Complaint Chest Pain    HPI Alexa Lowe is a 83 y.o. female with a past medical history of COPD, diabetes, hypertension presents to the emergency department for chest pain.  According to the patient over the past 3 weeks she has had a fairly constant central chest discomfort which she describes as a pressure feeling.  Over the past 2-3 days she is also been experiencing a dull pain in her right arm.  Patient denies any shortness of breath but states occasional nausea but denies any diaphoresis.  Patient states that chest pain is somewhat worse when she is sitting upright and somewhat relieved if she lies down in bed.  Denies any leg pain or swelling.  Past Medical History:  Diagnosis Date  . Cancer [facility])    Right breast  . COPD (chronic obstructive pulmonary disease) ([place])   . Diabetes mellitus without complication ([place])   . Hypertension     There are no active problems to display for this patient.   Past Surgical History:  Procedure Laterality Date  . ABDOMINAL SURGERY    . APPENDECTOMY    . BACK SURGERY    . BREAST SURGERY    . CHOLECYSTECTOMY      Prior to Admission medications   Medication Sig Start Date End Date Taking? Authorizing Provider  allopurinol (ZYLOPRIM) 300 MG tablet Take 300 mg by mouth daily.    [provider]  aspirin EC 325 MG tablet Take 325 mg by mouth daily.    [provider]  benzonatate (TESSALON) 100 MG capsule Take 100 mg by mouth 3 (three) times daily as needed for cough.    [provider]  citalopram (CELEXA) 20 MG tablet Take 20 mg by mouth daily.    [provider]  hydrOXYzine (ATARAX/VISTARIL) 25 MG tablet Take 25 mg by mouth 3 (three) times daily as needed for anxiety.    [provider]  losartan-hydrochlorothiazide  (HYZAAR) 50-12.5 MG per tablet Take 0.5 tablets by mouth daily.    [provider]  meclizine (ANTIVERT) 25 MG tablet Take 25 mg by mouth 3 (three) times daily as needed for dizziness.    [provider]  metFORMIN (GLUCOPHAGE) 500 MG tablet Take 500 mg by mouth 2 (two) times daily.     [provider]  pantoprazole (PROTONIX) 40 MG tablet Take 40 mg by mouth daily.    [provider]  predniSONE (DELTASONE) 20 MG tablet Take 2 tablets (40 mg total) by mouth daily. 10/29/14   Harvest Dark, MD  pregabalin (LYRICA) 50 MG capsule Take 50 mg by mouth 2 (two) times daily.    [provider]  ranitidine (ZANTAC) 150 MG tablet Take 150 mg by mouth daily.    [provider]  traMADol (ULTRAM) 50 MG tablet Take 100 mg by mouth at bedtime. Pt also uses every four hours if needed for pain.    [provider]    Allergies  Allergen Reactions  . Morphine And Related Other (See Comments)    Reaction:  Hallucinations   . Codeine Nausea And Vomiting    No family history on file.  Social History Social History  Substance Use Topics  . Smoking status: Former Research scientist (life sciences)  . Smokeless tobacco: Never Used  . Alcohol use No    Review  of Systems Constitutional: Negative for fever Cardiovascular: Positive for chest pain times 3 weeks Respiratory: Negative for shortness of breath. Gastrointestinal: Negative for abdominal pain Musculoskeletal: Negative for leg pain or swelling. Neurological: Negative for headache All other ROS negative  ____________________________________________   PHYSICAL EXAM:  VITAL SIGNS: ED Triage Vitals  Enc Vitals Group     BP 02/06/17 1604 130/69     Pulse Rate 02/06/17 1604 69     Resp 02/06/17 1604 16     Temp 02/06/17 1608 97.8 F (36.6 C)     Temp Source 02/06/17 1608 Oral     SpO2 02/06/17 1604 97 %     Weight 02/06/17 1605 220 lb (99.8 kg)     Height 02/06/17 1605 5\' 4"  (1.626 m)     Head  Circumference --      Peak Flow --      Pain Score 02/06/17 1604 8     Pain Loc --      Pain Edu? --      Excl. in [place]? --     Constitutional: Alert and oriented. Well appearing and in no distress. Eyes: Normal exam ENT   Head: Normocephalic and atraumatic   Mouth/Throat: Mucous membranes are moist. Cardiovascular: Normal rate, regular rhythm. No murmur Respiratory: Normal respiratory effort without tachypnea nor retractions. Breath sounds are clear.  Moderate central chest tenderness to palpation Gastrointestinal: Soft and nontender. No distention.  Musculoskeletal: Nontender with normal range of motion in all extremities. No lower extremity tenderness or edema. Neurologic:  Normal speech and language. No gross focal neurologic deficits.  Equal grip strength bilaterally.  5/5 motor. Skin:  Skin is warm, dry and intact.  Psychiatric: Mood and affect are normal.   ____________________________________________    EKG  EKG reviewed and interpreted by myself shows normal sinus rhythm at 66 bpm with a narrow QRS, left axis deviation, largely normal intervals with nonspecific ST changes but no ST elevation.  ____________________________________________    RADIOLOGY  Chest x-ray negative  ____________________________________________   INITIAL IMPRESSION / ASSESSMENT AND PLAN / ED COURSE  Pertinent labs & imaging results that were available during my care of the patient were reviewed by me and considered in my medical decision making (see chart for details).  Patient presents to the emergency department for chest pain.  Differential would include ACS, musculoskeletal pain, chest wall pain, pleurisy, costochondritis.  On exam the patient does have moderate chest wall tenderness to palpation.  Patient is EKG shows nonspecific ST changes.  We will obtain labs including troponin and a chest x-ray to further evaluate.  Patient is agreeable to this plan of care.  Overall the patient  appears well, no distress, lying in bed comfortably answering all questions appropriately and joking at times.  I have reviewed the patient's records including her telephone encounter today discussing with Dr. Netty Starring.   Chest x-ray is negative.  Patient's labs are largely at her baseline.  Troponin is negative.  I discussed with the patient given her age and comorbidities of the option to be admitted to the hospital versus going home.  Patient strongly wishes to go home, states she is feeling well currently.  We will discharge the patient home with cardiology follow-up.  Patient follows up with Dr. Saralyn Pilar ____________________________________________   FINAL CLINICAL IMPRESSION(S) / ED DIAGNOSES  Chest pain    Harvest Dark, MD 02/06/17 1939

## 2017-02-06 NOTE — Discharge Instructions (Signed)
You have been seen in the emergency department today for chest pain. Your workup has shown normal results. As we discussed please follow-up with your primary care physician in the next 1-2 days for recheck. Return to the emergency department for any further chest pain, trouble breathing, or any other symptom personally concerning to yourself. °

## 2017-02-06 NOTE — ED Notes (Signed)
Pt in NAD at time of d/c, pt Vs stable, pt verbalizes d/c teaching and follow up. Pt wheeled to lobby with son

## 2017-02-09 ENCOUNTER — Emergency Department: Payer: Medicare HMO

## 2017-02-09 ENCOUNTER — Encounter: Payer: Self-pay | Admitting: Emergency Medicine

## 2017-02-09 ENCOUNTER — Emergency Department
Admission: EM | Admit: 2017-02-09 | Discharge: 2017-02-09 | Disposition: A | Discharge status: Home / Self Care | Payer: Medicare HMO | Attending: Emergency Medicine | Admitting: Emergency Medicine

## 2017-02-09 DIAGNOSIS — R609 Edema, unspecified: Secondary | ICD-10-CM

## 2017-02-09 DIAGNOSIS — Z87891 Personal history of nicotine dependence: Secondary | ICD-10-CM | POA: Insufficient documentation

## 2017-02-09 DIAGNOSIS — Z79899 Other long term (current) drug therapy: Secondary | ICD-10-CM | POA: Insufficient documentation

## 2017-02-09 DIAGNOSIS — E119 Type 2 diabetes mellitus without complications: Secondary | ICD-10-CM | POA: Insufficient documentation

## 2017-02-09 DIAGNOSIS — M7532 Calcific tendinitis of left shoulder: Secondary | ICD-10-CM | POA: Insufficient documentation

## 2017-02-09 DIAGNOSIS — I10 Essential (primary) hypertension: Secondary | ICD-10-CM | POA: Diagnosis not present

## 2017-02-09 DIAGNOSIS — Z853 Personal history of malignant neoplasm of breast: Secondary | ICD-10-CM | POA: Insufficient documentation

## 2017-02-09 DIAGNOSIS — R52 Pain, unspecified: Secondary | ICD-10-CM

## 2017-02-09 DIAGNOSIS — J449 Chronic obstructive pulmonary disease, unspecified: Secondary | ICD-10-CM | POA: Diagnosis not present

## 2017-02-09 DIAGNOSIS — M79622 Pain in left upper arm: Secondary | ICD-10-CM

## 2017-02-09 DIAGNOSIS — R600 Localized edema: Secondary | ICD-10-CM | POA: Diagnosis not present

## 2017-02-09 DIAGNOSIS — Z7982 Long term (current) use of aspirin: Secondary | ICD-10-CM | POA: Insufficient documentation

## 2017-02-09 DIAGNOSIS — M7989 Other specified soft tissue disorders: Secondary | ICD-10-CM | POA: Diagnosis not present

## 2017-02-09 DIAGNOSIS — M19019 Primary osteoarthritis, unspecified shoulder: Secondary | ICD-10-CM | POA: Diagnosis not present

## 2017-02-09 LAB — COMPREHENSIVE METABOLIC PANEL
ALBUMIN: 3.4 g/dL — AB (ref 3.5–5.0)
ALK PHOS: 100 U/L (ref 38–126)
ALT: 11 U/L — ABNORMAL LOW (ref 14–54)
ANION GAP: 7 (ref 5–15)
AST: 23 U/L (ref 15–41)
BILIRUBIN TOTAL: 0.6 mg/dL (ref 0.3–1.2)
BUN: 18 mg/dL (ref 6–20)
CALCIUM: 9 mg/dL (ref 8.9–10.3)
CO2: 25 mmol/L (ref 22–32)
Chloride: 105 mmol/L (ref 101–111)
Creatinine, Ser: 1.14 mg/dL — ABNORMAL HIGH (ref 0.44–1.00)
GFR calc non Af Amer: 43 mL/min — ABNORMAL LOW (ref >60)
GFR, EST AFRICAN AMERICAN: 50 mL/min — AB (ref >60)
GLUCOSE: 82 mg/dL (ref 65–99)
POTASSIUM: 4.6 mmol/L (ref 3.5–5.1)
Sodium: 137 mmol/L (ref 135–145)
TOTAL PROTEIN: 6.4 g/dL — AB (ref 6.5–8.1)

## 2017-02-09 LAB — CBC WITH DIFFERENTIAL/PLATELET
BASOS PCT: 0 %
Basophils Absolute: 0 10*3/uL (ref 0–0.1)
EOS ABS: 0.2 10*3/uL (ref 0–0.7)
Eosinophils Relative: 2 %
HEMATOCRIT: 33.7 % — AB (ref 35.0–47.0)
HEMOGLOBIN: 10.3 g/dL — AB (ref 12.0–16.0)
LYMPHS ABS: 2 10*3/uL (ref 1.0–3.6)
Lymphocytes Relative: 18 %
MCH: 25.2 pg — AB (ref 26.0–34.0)
MCHC: 30.5 g/dL — AB (ref 32.0–36.0)
MCV: 82.8 fL (ref 80.0–100.0)
MONO ABS: 0.8 10*3/uL (ref 0.2–0.9)
MONOS PCT: 7 %
NEUTROS ABS: 8.3 10*3/uL — AB (ref 1.4–6.5)
Neutrophils Relative %: 73 %
Platelets: 229 10*3/uL (ref 150–440)
RBC: 4.08 MIL/uL (ref 3.80–5.20)
RDW: 18.9 % — AB (ref 11.5–14.5)
WBC: 11.4 10*3/uL — ABNORMAL HIGH (ref 3.6–11.0)

## 2017-02-09 LAB — TROPONIN I: Troponin I: <0.03 ng/mL (ref <0.03)

## 2017-02-09 MED ORDER — FENTANYL CITRATE (PF) 100 MCG/2ML IJ SOLN
25.0000 ug | Freq: Once | INTRAMUSCULAR | Status: AC
  Administered 2017-02-09: 25 ug via INTRAVENOUS
  Filled 2017-02-09: qty 2

## 2017-02-09 MED ORDER — DEXAMETHASONE SODIUM PHOSPHATE 4 MG/ML IJ SOLN
4.0000 mg | Freq: Once | INTRAMUSCULAR | Status: AC
  Administered 2017-02-09: 4 mg via INTRAVENOUS
  Filled 2017-02-09: qty 1

## 2017-02-09 MED ORDER — HYDROCODONE-ACETAMINOPHEN 5-325 MG PO TABS
1.0000 | ORAL_TABLET | Freq: Once | ORAL | Status: AC
  Administered 2017-02-09: 1 via ORAL
  Filled 2017-02-09: qty 1

## 2017-02-09 MED ORDER — HYDROCODONE-ACETAMINOPHEN 5-325 MG PO TABS: ORAL_TABLET | ORAL | 0 refills | Status: DC

## 2017-02-09 MED ORDER — PREDNISONE 10 MG PO TABS: ORAL_TABLET | ORAL | 0 refills | Status: DC

## 2017-02-09 NOTE — ED Notes (Signed)
Took message from daughter Holley Raring for pt to call.

## 2017-02-09 NOTE — ED Notes (Signed)
Patient transported to Ultrasound 

## 2017-02-09 NOTE — ED Provider Notes (Signed)
[facility] Emergency Department Provider Note  ___________________________________________   First MD Initiated Contact with Patient 02/09/17 1325     (approximate)  I have reviewed the triage vital signs and the nursing notes.   HISTORY  Chief Complaint Arm Pain   HPI Alexa Lowe is a 83 y.o. female comes today with complaint of left upper arm pain. Patient states that she was seen in the ED on Thursday and since having her blood pressure taken in her left arm she has been unable to use her arm without a lot of pain. She states that she had breast surgery on the left side in 1994 but has not experienced any difficulties in her arm. She denies any chest pain or shortness of breath. She states the pain is worse today than yesterday. Patient was able to drive herself to the ED but has called family members to come pick her up. Currently she rates her pain as 3 out of 10.   Past Medical History:  Diagnosis Date  . Cancer [facility])    Right breast  . COPD (chronic obstructive pulmonary disease) ([place])   . Diabetes mellitus without complication ([place])   . Hypertension     There are no active problems to display for this patient.   Past Surgical History:  Procedure Laterality Date  . ABDOMINAL SURGERY    . APPENDECTOMY    . BACK SURGERY    . BREAST SURGERY    . CHOLECYSTECTOMY      Prior to Admission medications   Medication Sig Start Date End Date Taking? Authorizing Provider  allopurinol (ZYLOPRIM) 300 MG tablet Take 300 mg by mouth daily.    [provider]  aspirin EC 325 MG tablet Take 325 mg by mouth daily.    [provider]  atorvastatin (LIPITOR) 40 MG tablet Take 40 mg by mouth daily.    [provider]  celecoxib (CELEBREX) 200 MG capsule Take 1 capsule by mouth 2 (two) times daily. 12/05/16   [provider]  citalopram (CELEXA) 20 MG tablet Take 30 mg by mouth daily.     [provider]    gabapentin (NEURONTIN) 100 MG capsule Take 200 mg by mouth 4 (four) times daily.     [provider]  HYDROcodone-acetaminophen (NORCO/VICODIN) 5-325 MG tablet Take 1 tablet every 6-8 hours prn pain 02/09/17   Johnn Hai, PA-C  losartan-hydrochlorothiazide (HYZAAR) 50-12.5 MG per tablet Take 0.5 tablets by mouth daily.    [provider]  metFORMIN (GLUCOPHAGE) 500 MG tablet Take 500 mg by mouth 2 (two) times daily.     [provider]  pantoprazole (PROTONIX) 40 MG tablet Take 40 mg by mouth daily.    [provider]  predniSONE (DELTASONE) 10 MG tablet Take 2 tablets once a day for 3 days starting Monday 02/09/17   Johnn Hai, PA-C  ranitidine (ZANTAC) 150 MG tablet Take 150 mg by mouth daily.    [provider]    Allergies Morphine and related and Codeine  No family history on file.  Social History Social History   Tobacco Use  . Smoking status: Former Research scientist (life sciences)  . Smokeless tobacco: Never Used  Substance Use Topics  . Alcohol use: No  . Drug use: No    Review of Systems Constitutional: No fever/chills Cardiovascular: Denies chest pain. Respiratory: Denies shortness of breath. Gastrointestinal: No abdominal pain.  No nausea, no vomiting.   Musculoskeletal: positive for left upper  extremity pain Skin: negative for rash, ecchymosis or abrasions. Neurological: Negative for headaches, focal weakness or numbness. ____________________________________________   PHYSICAL EXAM:  VITAL SIGNS: ED Triage Vitals  Enc Vitals Group     BP 02/09/17 1259 127/74     Pulse Rate 02/09/17 1259 80     Resp 02/09/17 1259 16     Temp 02/09/17 1259 (!) 97.5 F (36.4 C)     Temp Source 02/09/17 1259 Oral     SpO2 02/09/17 1259 95 %     Weight 02/09/17 1256 220 lb (99.8 kg)     Height 02/09/17 1256 5\' 4"  (1.626 m)     Head Circumference --      Peak Flow --      Pain Score 02/09/17 1256 3     Pain Loc --      Pain Edu? --       Excl. in [place]? --    Constitutional: Alert and oriented. Well appearing and in no acute distress. Eyes: Conjunctivae are normal.  Head: Atraumatic. Neck: No stridor.   Cardiovascular: Normal rate, regular rhythm. Grossly normal heart sounds.  Good peripheral circulation. Respiratory: Normal respiratory effort.  No retractions. Lungs CTAB. Gastrointestinal: Soft and nontender. No distention. Musculoskeletal: examination of the left upper arm there is some point tenderness SOFT tissue mid humerus medial aspect without erythema or ecchymosis. There is no warmth appreciated. There is no appreciated soft tissue swelling. Pulses present and skin is intact. Pain is increased with range of motion. Neurologic:  Normal speech and language. No gross focal neurologic deficits are appreciated.  Skin:  Skin is warm, dry and intact. No ecchymosis or abrasions noted. No erythema present. Psychiatric: Mood and affect are normal. Speech and behavior are normal.  ____________________________________________   LABS (all labs ordered are listed, but only abnormal results are displayed)  Labs Reviewed  CBC WITH DIFFERENTIAL/PLATELET - Abnormal; Notable for the following components:      Result Value   WBC 11.4 (*)    Hemoglobin 10.3 (*)    HCT 33.7 (*)    MCH 25.2 (*)    MCHC 30.5 (*)    RDW 18.9 (*)    Neutro Abs 8.3 (*)    All other components within normal limits  COMPREHENSIVE METABOLIC PANEL - Abnormal; Notable for the following components:   Creatinine, Ser 1.14 (*)    Total Protein 6.4 (*)    Albumin 3.4 (*)    ALT 11 (*)    GFR calc non Af Amer 43 (*)    GFR calc Af Amer 50 (*)    All other components within normal limits  TROPONIN I   ____________________________________________  EKG Sinus bradycardia with sinus arrhythmia with ventricular rate of 58. No STEMI per Dr. Karma Greaser.   ____________________________________________  RADIOLOGY  US Venous Img Upper Uni Left  Result Date:  02/09/2017 CLINICAL DATA:  Left upper extremity pain and edema for 3 days. EXAM: LEFT UPPER EXTREMITY VENOUS DOPPLER ULTRASOUND TECHNIQUE: Gray-scale sonography with graded compression, as well as color Doppler and duplex ultrasound were performed to evaluate the upper extremity deep venous system from the level of the subclavian vein and including the jugular, axillary, basilic, radial, ulnar and upper cephalic vein. Spectral Doppler was utilized to evaluate flow at rest and with distal augmentation maneuvers. COMPARISON:  None. FINDINGS: Contralateral Subclavian Vein: Respiratory phasicity is normal and symmetric with the symptomatic side. No evidence of thrombus. Normal compressibility. Internal Jugular Vein: No evidence of thrombus. Normal  compressibility, respiratory phasicity and response to augmentation. Subclavian Vein: No evidence of thrombus. Normal compressibility, respiratory phasicity and response to augmentation. Axillary Vein: No evidence of thrombus. Normal compressibility, respiratory phasicity and response to augmentation. Cephalic Vein: No evidence of thrombus. Normal compressibility, respiratory phasicity and response to augmentation. Basilic Vein: No evidence of thrombus. Normal compressibility, respiratory phasicity and response to augmentation. Brachial Veins: No evidence of thrombus. Normal compressibility, respiratory phasicity and response to augmentation. Radial Veins: No evidence of thrombus. Normal compressibility, respiratory phasicity and response to augmentation. Ulnar Veins: No evidence of thrombus. Normal compressibility, respiratory phasicity and response to augmentation. Venous Reflux:  None visualized. Other Findings:  None visualized. IMPRESSION: No evidence of DVT within the left upper extremity. Electronically Signed   By: Lajean Manes M.D.   On: 02/09/2017 16:16   Dg Humerus Left  Result Date: 02/09/2017 CLINICAL DATA:  Painful left arm EXAM: LEFT HUMERUS - 2+ VIEW  COMPARISON:  None. FINDINGS: Moderate AC joint degenerative change. Mild glenohumeral degenerative change. Calcific tendinitis at the humeral head. No fracture or dislocation. No periostitis. IMPRESSION: 1. No acute osseous abnormality 2. Calcific tendinitis 3. AC joint degenerative change Electronically Signed   By: Donavan Foil M.D.   On: 02/09/2017 18:59    ____________________________________________   PROCEDURES  Procedure(s) performed: None  Procedures  Critical Care performed: No  ____________________________________________   INITIAL IMPRESSION / ASSESSMENT AND PLAN / ED COURSE ----------------------------------------- 6:21 PM on 02/09/2017 ----------------------------------------- Patient had relief of her left arm pain with fentanyl 25 mcg.  Patient is requesting that her left arm be x-rayed. ----------------------------------------- 7:21 PM on 02/09/2017 ----------------------------------------- X-rays were discussed with patient showing AC joint degenerative changes  as well as a calcific tendinitis. Patient no longer takes prednisone as indicated on her medical record. She states that her sugars are under control and that even while taking prednisone blood sugars never reached above 200. Prior to discharge patient was given  fentanyl 25 mcg and Decadron 4 mg IV.  Patient was discharged with a prescription for Norco as she tolerated it well in the emergency department one every 6-8 hours as needed for pain. She is also given a prescription for prednisone 20 mg once a day for the next 3 days. Patient is taking prednisone in the past without any difficulties. Patient also checks her glucose at home.   ____________________________________________   FINAL CLINICAL IMPRESSION(S) / ED DIAGNOSES  Final diagnoses:  Swelling  Pain  Left upper arm pain  Calcific tendonitis of left shoulder      NEW MEDICATIONS STARTED DURING THIS VISIT:  This SmartLink is deprecated.  Use AVSMEDLIST instead to display the medication list for a patient.   Note:  This document was prepared using Dragon voice recognition software and may include unintentional dictation errors.    Johnn Hai, PA-C 02/09/17 1936    Lisa Roca, MD 02/12/17 1120

## 2017-02-09 NOTE — ED Notes (Signed)
Per [facility], pt is on a med hold until family arrives.

## 2017-02-09 NOTE — ED Provider Notes (Signed)
ED ECG REPORT I, Dahlton Hinde, the attending physician, personally viewed and interpreted this ECG.  Date: 02/09/2017 EKG Time: 16:46 Rate: 58 Rhythm: borderline sinus bradycardia QRS Axis: LAD Intervals: normal (low voltage QRS) ST/T Wave abnormalities: Non-specific ST segment / T-wave changes, but no evidence of acute ischemia. Narrative Interpretation: no evidence of acute ischemia     Hinda Kehr, MD 02/09/17 1656

## 2017-02-09 NOTE — ED Triage Notes (Signed)
Pt to ED via POV, pt states that she was seen in the ED on Thursday night and they took her blood pressure in her left arm. Pt states that since then she has been unable to use her arm because it is so painful. Pt states that she had breast surgery on that side in 1994. Patient states that the pain is mostly in the upper arm and is worse when she moves her arm.

## 2017-02-09 NOTE — ED Notes (Signed)
Took message from [place] for pt to call,

## 2017-02-09 NOTE — Discharge Instructions (Signed)
Call and make an appointment with your primary care provider for this week as follow-up. You may use heat or ice to your shoulder as needed for discomfort. Take pain medication 1 tablet every 6-8 hours as needed for pain. Also begin taking prednisone 20 mg once a day for the next 3 days. Start this medication on Monday.

## 2017-02-09 NOTE — ED Notes (Signed)
Pt given phone to use 

## 2017-02-12 DIAGNOSIS — M7582 Other shoulder lesions, left shoulder: Secondary | ICD-10-CM | POA: Diagnosis not present

## 2017-02-12 DIAGNOSIS — Z87898 Personal history of other specified conditions: Secondary | ICD-10-CM | POA: Diagnosis not present

## 2017-02-19 DIAGNOSIS — M25512 Pain in left shoulder: Secondary | ICD-10-CM | POA: Diagnosis not present

## 2017-02-19 DIAGNOSIS — G8929 Other chronic pain: Secondary | ICD-10-CM | POA: Diagnosis not present

## 2017-02-19 DIAGNOSIS — E7800 Pure hypercholesterolemia, unspecified: Secondary | ICD-10-CM | POA: Diagnosis not present

## 2017-02-19 DIAGNOSIS — I10 Essential (primary) hypertension: Secondary | ICD-10-CM | POA: Diagnosis not present

## 2017-02-19 DIAGNOSIS — E119 Type 2 diabetes mellitus without complications: Secondary | ICD-10-CM | POA: Diagnosis not present

## 2017-02-24 DIAGNOSIS — I10 Essential (primary) hypertension: Secondary | ICD-10-CM | POA: Diagnosis not present

## 2017-02-24 DIAGNOSIS — J449 Chronic obstructive pulmonary disease, unspecified: Secondary | ICD-10-CM | POA: Diagnosis not present

## 2017-02-24 DIAGNOSIS — R0602 Shortness of breath: Secondary | ICD-10-CM | POA: Diagnosis not present

## 2017-02-24 DIAGNOSIS — E119 Type 2 diabetes mellitus without complications: Secondary | ICD-10-CM | POA: Diagnosis not present

## 2017-02-24 DIAGNOSIS — Z9889 Other specified postprocedural states: Secondary | ICD-10-CM | POA: Diagnosis not present

## 2017-02-24 DIAGNOSIS — R079 Chest pain, unspecified: Secondary | ICD-10-CM | POA: Diagnosis not present

## 2017-02-26 DIAGNOSIS — G8929 Other chronic pain: Secondary | ICD-10-CM | POA: Diagnosis not present

## 2017-02-26 DIAGNOSIS — I10 Essential (primary) hypertension: Secondary | ICD-10-CM | POA: Diagnosis not present

## 2017-02-26 DIAGNOSIS — M25512 Pain in left shoulder: Secondary | ICD-10-CM | POA: Diagnosis not present

## 2017-02-26 DIAGNOSIS — E119 Type 2 diabetes mellitus without complications: Secondary | ICD-10-CM | POA: Diagnosis not present

## 2017-02-26 DIAGNOSIS — Z23 Encounter for immunization: Secondary | ICD-10-CM | POA: Diagnosis not present

## 2017-02-26 DIAGNOSIS — Z0000 Encounter for general adult medical examination without abnormal findings: Secondary | ICD-10-CM | POA: Diagnosis not present

## 2017-02-26 DIAGNOSIS — E7800 Pure hypercholesterolemia, unspecified: Secondary | ICD-10-CM | POA: Diagnosis not present

## 2017-06-17 DIAGNOSIS — Z8739 Personal history of other diseases of the musculoskeletal system and connective tissue: Secondary | ICD-10-CM | POA: Diagnosis not present

## 2017-06-17 DIAGNOSIS — I10 Essential (primary) hypertension: Secondary | ICD-10-CM | POA: Diagnosis not present

## 2017-06-17 DIAGNOSIS — E119 Type 2 diabetes mellitus without complications: Secondary | ICD-10-CM | POA: Diagnosis not present

## 2017-06-17 DIAGNOSIS — R5383 Other fatigue: Secondary | ICD-10-CM | POA: Diagnosis not present

## 2017-06-17 DIAGNOSIS — E7800 Pure hypercholesterolemia, unspecified: Secondary | ICD-10-CM | POA: Diagnosis not present

## 2017-06-17 DIAGNOSIS — Z205 Contact with and (suspected) exposure to viral hepatitis: Secondary | ICD-10-CM | POA: Diagnosis not present

## 2017-06-17 DIAGNOSIS — Z0000 Encounter for general adult medical examination without abnormal findings: Secondary | ICD-10-CM | POA: Diagnosis not present

## 2017-06-26 DIAGNOSIS — N183 Chronic kidney disease, stage 3 (moderate): Secondary | ICD-10-CM | POA: Diagnosis not present

## 2017-06-26 DIAGNOSIS — K219 Gastro-esophageal reflux disease without esophagitis: Secondary | ICD-10-CM | POA: Diagnosis not present

## 2017-06-26 DIAGNOSIS — E119 Type 2 diabetes mellitus without complications: Secondary | ICD-10-CM | POA: Diagnosis not present

## 2017-06-26 DIAGNOSIS — E7800 Pure hypercholesterolemia, unspecified: Secondary | ICD-10-CM | POA: Diagnosis not present

## 2017-06-26 DIAGNOSIS — I10 Essential (primary) hypertension: Secondary | ICD-10-CM | POA: Diagnosis not present

## 2017-06-26 DIAGNOSIS — M25511 Pain in right shoulder: Secondary | ICD-10-CM | POA: Diagnosis not present

## 2017-06-26 DIAGNOSIS — M109 Gout, unspecified: Secondary | ICD-10-CM | POA: Diagnosis not present

## 2017-06-26 DIAGNOSIS — D638 Anemia in other chronic diseases classified elsewhere: Secondary | ICD-10-CM | POA: Diagnosis not present

## 2017-06-30 DIAGNOSIS — M25511 Pain in right shoulder: Secondary | ICD-10-CM | POA: Diagnosis not present

## 2017-07-18 ENCOUNTER — Emergency Department
Admission: EM | Admit: 2017-07-18 | Discharge: 2017-07-18 | Disposition: A | Discharge status: Home / Self Care | Payer: Medicare HMO | Attending: Emergency Medicine | Admitting: Emergency Medicine

## 2017-07-18 ENCOUNTER — Encounter: Payer: Self-pay | Admitting: Emergency Medicine

## 2017-07-18 ENCOUNTER — Emergency Department: Payer: Medicare HMO

## 2017-07-18 DIAGNOSIS — Z7984 Long term (current) use of oral hypoglycemic drugs: Secondary | ICD-10-CM | POA: Diagnosis not present

## 2017-07-18 DIAGNOSIS — K219 Gastro-esophageal reflux disease without esophagitis: Secondary | ICD-10-CM | POA: Insufficient documentation

## 2017-07-18 DIAGNOSIS — R110 Nausea: Secondary | ICD-10-CM | POA: Diagnosis not present

## 2017-07-18 DIAGNOSIS — I10 Essential (primary) hypertension: Secondary | ICD-10-CM | POA: Diagnosis not present

## 2017-07-18 DIAGNOSIS — J449 Chronic obstructive pulmonary disease, unspecified: Secondary | ICD-10-CM | POA: Insufficient documentation

## 2017-07-18 DIAGNOSIS — E119 Type 2 diabetes mellitus without complications: Secondary | ICD-10-CM | POA: Insufficient documentation

## 2017-07-18 DIAGNOSIS — R0789 Other chest pain: Secondary | ICD-10-CM | POA: Diagnosis not present

## 2017-07-18 DIAGNOSIS — R079 Chest pain, unspecified: Secondary | ICD-10-CM | POA: Diagnosis not present

## 2017-07-18 DIAGNOSIS — Z87891 Personal history of nicotine dependence: Secondary | ICD-10-CM | POA: Diagnosis not present

## 2017-07-18 LAB — CBC
HEMATOCRIT: 30.2 % — AB (ref 35.0–47.0)
Hemoglobin: 9.4 g/dL — ABNORMAL LOW (ref 12.0–16.0)
MCH: 25.1 pg — AB (ref 26.0–34.0)
MCHC: 31.2 g/dL — ABNORMAL LOW (ref 32.0–36.0)
MCV: 80.6 fL (ref 80.0–100.0)
Platelets: 231 10*3/uL (ref 150–440)
RBC: 3.75 MIL/uL — AB (ref 3.80–5.20)
RDW: 21.4 % — ABNORMAL HIGH (ref 11.5–14.5)
WBC: 8.8 10*3/uL (ref 3.6–11.0)

## 2017-07-18 LAB — BASIC METABOLIC PANEL
Anion gap: 5 (ref 5–15)
BUN: 22 mg/dL — ABNORMAL HIGH (ref 6–20)
CO2: 25 mmol/L (ref 22–32)
Calcium: 9.2 mg/dL (ref 8.9–10.3)
Chloride: 107 mmol/L (ref 101–111)
Creatinine, Ser: 1.02 mg/dL — ABNORMAL HIGH (ref 0.44–1.00)
GFR calc non Af Amer: 49 mL/min — ABNORMAL LOW (ref >60)
GFR, EST AFRICAN AMERICAN: 57 mL/min — AB (ref >60)
GLUCOSE: 118 mg/dL — AB (ref 65–99)
POTASSIUM: 4.5 mmol/L (ref 3.5–5.1)
Sodium: 137 mmol/L (ref 135–145)

## 2017-07-18 LAB — TROPONIN I: Troponin I: <0.03 ng/mL (ref <0.03)

## 2017-07-18 MED ORDER — GI COCKTAIL ~~LOC~~
30.0000 mL | Freq: Once | ORAL | Status: AC
  Administered 2017-07-18: 30 mL via ORAL
  Filled 2017-07-18: qty 30

## 2017-07-18 MED ORDER — ALUM & MAG HYDROXIDE-SIMETH 200-200-20 MG/5ML PO SUSP: 30.0000 mL | ORAL | 0 refills | Status: DC | PRN

## 2017-07-18 NOTE — ED Triage Notes (Signed)
Patient presents to the ED via [facility] for chest pain x 3 weeks, worse the past two days.  Patient took 1 sl nitro prior to arrival and EMS gave 1 nitro spray as well as 4mg  of IV zofran for nausea.  Per EMS, EKG showed Afib and patient does not have history of Afib.  Patient has a DNR.  Patient reports "clutching" chest pain.  No obvious distress at this time.

## 2017-07-18 NOTE — ED Notes (Signed)
Dr. Paduchowski at bedside.  

## 2017-07-18 NOTE — Discharge Instructions (Addendum)
As we discussed please use your prescribed Maalox after each meal as needed for any discomfort, and approximately 1 hour before going to sleep.  Please use multiple pillows to maintain a somewhat upright position while sleeping to help with the reflux disease.  Return to the emergency department for any worsening chest pain, trouble breathing, nausea, or any other symptom personally concerning to yourself.

## 2017-07-18 NOTE — ED Provider Notes (Signed)
[facility] Emergency Department Provider Note  Time seen: 2:34 PM  I have reviewed the triage vital signs and the nursing notes.   HISTORY  Chief Complaint Pleurisy    HPI Alexa Lowe is a 84 y.o. female with a past medical history of COPD, diabetes, hypertension, presents to the emergency department for chest discomfort.  According to the patient over the past 2-3 weeks she has been experiencing near daily chest discomfort which she describes as a burning sensation in the top of her abdomen that goes into her chest.  Patient states a significant history of gastric reflux in the past which this feels similar.  States she wakes up often with a sour/bitter taste in her mouth.  States the pain is always worse in the morning when she first gets up and slowly resolves throughout the day.  Is taking her gastric reflux medication without relief.  Denies any shortness of breath, cough, diaphoresis.  Does state nausea but denies any vomiting.  Largely negative review of systems otherwise.   Past Medical History:  Diagnosis Date  . Cancer [facility])    Right breast  . COPD (chronic obstructive pulmonary disease) ([place])   . Diabetes mellitus without complication ([place])   . Hypertension     There are no active problems to display for this patient.   Past Surgical History:  Procedure Laterality Date  . ABDOMINAL SURGERY    . APPENDECTOMY    . BACK SURGERY    . BREAST SURGERY    . CHOLECYSTECTOMY      Prior to Admission medications   Medication Sig Start Date End Date Taking? Authorizing Provider  allopurinol (ZYLOPRIM) 300 MG tablet Take 300 mg by mouth daily.   Yes [provider]  aspirin EC 325 MG tablet Take 325 mg by mouth daily.   Yes [provider]  atorvastatin (LIPITOR) 40 MG tablet Take 40 mg by mouth daily.   Yes [provider]  celecoxib (CELEBREX) 200 MG capsule Take 1 capsule by mouth 2 (two) times daily. 12/05/16  Yes  [provider]  citalopram (CELEXA) 20 MG tablet Take 30 mg by mouth daily.    Yes [provider]  gabapentin (NEURONTIN) 100 MG capsule Take 200 mg by mouth 4 (four) times daily.    Yes [provider]  losartan-hydrochlorothiazide (HYZAAR) 50-12.5 MG per tablet Take 0.5 tablets by mouth daily.   Yes [provider]  metFORMIN (GLUCOPHAGE) 500 MG tablet Take 500 mg by mouth 2 (two) times daily.    Yes [provider]  pantoprazole (PROTONIX) 40 MG tablet Take 40 mg by mouth daily.   Yes [provider]  ranitidine (ZANTAC) 150 MG tablet Take 150 mg by mouth daily.   Yes [provider]  HYDROcodone-acetaminophen (NORCO/VICODIN) 5-325 MG tablet Take 1 tablet every 6-8 hours prn pain Patient not taking: Reported on 07/18/2017 02/09/17   Johnn Hai, PA-C  predniSONE (DELTASONE) 10 MG tablet Take 2 tablets once a day for 3 days starting Monday Patient not taking: Reported on 07/18/2017 02/09/17   Johnn Hai, PA-C    Allergies  Allergen Reactions  . Morphine And Related Other (See Comments)    Reaction:  Hallucinations   . Codeine Nausea And Vomiting    No family history on file.  Social History Social History   Tobacco Use  . Smoking status: Former Research scientist (life sciences)  . Smokeless tobacco: Never Used  Substance Use Topics  . Alcohol  use: No  . Drug use: No    Review of Systems Constitutional: Negative for fever. Eyes: Negative for visual complaints ENT: Negative for recent illness/congestion Cardiovascular: Positive for central chest burning Respiratory: Negative for shortness of breath. Gastrointestinal: Moderate epigastric burning.  Positive for nausea.  Negative for vomiting or diarrhea Genitourinary: Negative for urinary compaints Musculoskeletal: Negative for musculoskeletal complaints Skin: Negative for skin complaints  Neurological: Negative for headache All other ROS  negative  ____________________________________________   PHYSICAL EXAM:  VITAL SIGNS: ED Triage Vitals  Enc Vitals Group     BP 07/18/17 1309 (!) 137/52     Pulse Rate 07/18/17 1309 (!) 57     Resp 07/18/17 1309 16     Temp 07/18/17 1309 98.5 F (36.9 C)     Temp Source 07/18/17 1309 Oral     SpO2 07/18/17 1309 97 %     Weight 07/18/17 1304 220 lb (99.8 kg)     Height 07/18/17 1304 5\' 3"  (1.6 m)     Head Circumference --      Peak Flow --      Pain Score 07/18/17 1304 6     Pain Loc --      Pain Edu? --      Excl. in [place]? --    Constitutional: Alert and oriented. Well appearing and in no distress. Eyes: Normal exam ENT   Head: Normocephalic and atraumatic   Mouth/Throat: Mucous membranes are moist. Cardiovascular: Normal rate, regular rhythm.  Respiratory: Normal respiratory effort without tachypnea nor retractions. Breath sounds are clear  Gastrointestinal: Soft and nontender. No distention.   Musculoskeletal: Nontender with normal range of motion in all extremities. No lower extremity tenderness  Neurologic:  Normal speech and language. No gross focal neurologic deficits Skin:  Skin is warm, dry and intact.  Psychiatric: Mood and affect are normal.   ____________________________________________    EKG  EKG reviewed and interpreted by myself shows sinus bradycardia 59 bpm with a narrow QRS, left axis deviation, largely normal intervals with no concerning ST changes.  ____________________________________________    RADIOLOGY  Chest x-ray normal  ____________________________________________   INITIAL IMPRESSION / ASSESSMENT AND PLAN / ED COURSE  Pertinent labs & imaging results that were available during my care of the patient were reviewed by me and considered in my medical decision making (see chart for details).  Patient presents to the emergency department for chest discomfort which she describes as a burning sensation from her epigastrium up into  her throat.  Differential would include gastric reflux, ACS, pancreatitis, gastric/peptic ulcer disease or gastritis.  We will treat with a GI cocktail monitor for symptom improvement.  Patient's labs have resulted largely within normal limits/baseline for the patient.  Negative troponin.  Chest x-ray is normal.  EKG is reassuring. H/H stable.   Patient states improvement after GI cocktail.  Labs are normal reassuring.  Daughter is here.  I have updated the daughter and the patient the findings.  We will discharge home with Maalox to be used as needed, especially just before bedtime.  Patient is already on Protonix.  ____________________________________________   FINAL CLINICAL IMPRESSION(S) / ED DIAGNOSES  Chest discomfort Gastric reflux   Harvest Dark, MD 07/18/17 [phone]

## 2017-07-18 NOTE — ED Notes (Signed)
Pt states for 3 weeks she has had central CP that sometimes goes into L arm. States nausea as well. Pt states "they tell me I've had mild heart attacks and I've had angina for years." pt alert, oriented, no distress noted at this time.

## 2017-07-18 NOTE — ED Notes (Signed)
Pt given phone to call family.

## 2017-07-23 DIAGNOSIS — J209 Acute bronchitis, unspecified: Secondary | ICD-10-CM | POA: Diagnosis not present

## 2017-07-24 DIAGNOSIS — R197 Diarrhea, unspecified: Secondary | ICD-10-CM | POA: Diagnosis not present

## 2017-07-24 DIAGNOSIS — K921 Melena: Secondary | ICD-10-CM | POA: Diagnosis not present

## 2017-07-24 DIAGNOSIS — D638 Anemia in other chronic diseases classified elsewhere: Secondary | ICD-10-CM | POA: Diagnosis not present

## 2017-07-24 DIAGNOSIS — R42 Dizziness and giddiness: Secondary | ICD-10-CM | POA: Diagnosis not present

## 2017-08-01 DIAGNOSIS — R197 Diarrhea, unspecified: Secondary | ICD-10-CM | POA: Diagnosis not present

## 2017-08-01 DIAGNOSIS — K921 Melena: Secondary | ICD-10-CM | POA: Diagnosis not present

## 2017-08-01 DIAGNOSIS — D638 Anemia in other chronic diseases classified elsewhere: Secondary | ICD-10-CM | POA: Diagnosis not present

## 2017-08-01 DIAGNOSIS — Z1211 Encounter for screening for malignant neoplasm of colon: Secondary | ICD-10-CM | POA: Diagnosis not present

## 2017-08-15 DIAGNOSIS — K219 Gastro-esophageal reflux disease without esophagitis: Secondary | ICD-10-CM | POA: Diagnosis not present

## 2017-08-15 DIAGNOSIS — G250 Essential tremor: Secondary | ICD-10-CM | POA: Diagnosis not present

## 2017-09-04 DIAGNOSIS — Z4432 Encounter for fitting and adjustment of external left breast prosthesis: Secondary | ICD-10-CM | POA: Diagnosis not present

## 2017-09-04 DIAGNOSIS — C50112 Malignant neoplasm of central portion of left female breast: Secondary | ICD-10-CM | POA: Diagnosis not present

## 2017-10-06 DIAGNOSIS — Z9889 Other specified postprocedural states: Secondary | ICD-10-CM | POA: Diagnosis not present

## 2017-10-06 DIAGNOSIS — J449 Chronic obstructive pulmonary disease, unspecified: Secondary | ICD-10-CM | POA: Diagnosis not present

## 2017-10-06 DIAGNOSIS — I10 Essential (primary) hypertension: Secondary | ICD-10-CM | POA: Diagnosis not present

## 2017-10-06 DIAGNOSIS — N183 Chronic kidney disease, stage 3 (moderate): Secondary | ICD-10-CM | POA: Diagnosis not present

## 2017-10-06 DIAGNOSIS — R079 Chest pain, unspecified: Secondary | ICD-10-CM | POA: Diagnosis not present

## 2017-10-06 DIAGNOSIS — E119 Type 2 diabetes mellitus without complications: Secondary | ICD-10-CM | POA: Diagnosis not present

## 2017-10-06 DIAGNOSIS — R0602 Shortness of breath: Secondary | ICD-10-CM | POA: Diagnosis not present

## 2017-10-20 DIAGNOSIS — E119 Type 2 diabetes mellitus without complications: Secondary | ICD-10-CM | POA: Diagnosis not present

## 2017-10-20 DIAGNOSIS — D638 Anemia in other chronic diseases classified elsewhere: Secondary | ICD-10-CM | POA: Diagnosis not present

## 2017-10-20 DIAGNOSIS — E7800 Pure hypercholesterolemia, unspecified: Secondary | ICD-10-CM | POA: Diagnosis not present

## 2017-10-27 DIAGNOSIS — E119 Type 2 diabetes mellitus without complications: Secondary | ICD-10-CM | POA: Diagnosis not present

## 2017-10-27 DIAGNOSIS — Z23 Encounter for immunization: Secondary | ICD-10-CM | POA: Diagnosis not present

## 2017-10-27 DIAGNOSIS — N183 Chronic kidney disease, stage 3 (moderate): Secondary | ICD-10-CM | POA: Diagnosis not present

## 2017-10-27 DIAGNOSIS — M150 Primary generalized (osteo)arthritis: Secondary | ICD-10-CM | POA: Diagnosis not present

## 2017-10-27 DIAGNOSIS — I10 Essential (primary) hypertension: Secondary | ICD-10-CM | POA: Diagnosis not present

## 2017-10-27 DIAGNOSIS — E7800 Pure hypercholesterolemia, unspecified: Secondary | ICD-10-CM | POA: Diagnosis not present

## 2017-11-03 DIAGNOSIS — M19011 Primary osteoarthritis, right shoulder: Secondary | ICD-10-CM | POA: Diagnosis not present

## 2017-11-03 DIAGNOSIS — M150 Primary generalized (osteo)arthritis: Secondary | ICD-10-CM | POA: Diagnosis not present

## 2017-11-04 DIAGNOSIS — Z9889 Other specified postprocedural states: Secondary | ICD-10-CM | POA: Diagnosis not present

## 2017-11-04 DIAGNOSIS — R0602 Shortness of breath: Secondary | ICD-10-CM | POA: Diagnosis not present

## 2017-11-04 DIAGNOSIS — R0789 Other chest pain: Secondary | ICD-10-CM | POA: Diagnosis not present

## 2017-11-10 DIAGNOSIS — I10 Essential (primary) hypertension: Secondary | ICD-10-CM | POA: Diagnosis not present

## 2017-11-10 DIAGNOSIS — R079 Chest pain, unspecified: Secondary | ICD-10-CM | POA: Diagnosis not present

## 2017-11-10 DIAGNOSIS — E119 Type 2 diabetes mellitus without complications: Secondary | ICD-10-CM | POA: Diagnosis not present

## 2017-11-10 DIAGNOSIS — Z9889 Other specified postprocedural states: Secondary | ICD-10-CM | POA: Diagnosis not present

## 2017-11-10 DIAGNOSIS — R0602 Shortness of breath: Secondary | ICD-10-CM | POA: Diagnosis not present

## 2017-11-10 DIAGNOSIS — E7800 Pure hypercholesterolemia, unspecified: Secondary | ICD-10-CM | POA: Diagnosis not present

## 2017-11-24 ENCOUNTER — Other Ambulatory Visit: Payer: Self-pay | Admitting: Gastroenterology

## 2017-11-24 DIAGNOSIS — R110 Nausea: Secondary | ICD-10-CM

## 2017-11-24 DIAGNOSIS — R1013 Epigastric pain: Secondary | ICD-10-CM | POA: Diagnosis not present

## 2017-12-10 ENCOUNTER — Ambulatory Visit
Admission: RE | Admit: 2017-12-10 | Discharge: 2017-12-10 | Disposition: A | Discharge status: Home / Self Care | Payer: Medicare HMO | Source: Ambulatory Visit | Attending: Gastroenterology | Admitting: Gastroenterology

## 2017-12-10 DIAGNOSIS — R1013 Epigastric pain: Secondary | ICD-10-CM | POA: Insufficient documentation

## 2017-12-10 DIAGNOSIS — R110 Nausea: Secondary | ICD-10-CM | POA: Diagnosis not present

## 2017-12-10 LAB — POCT I-STAT CREATININE: CREATININE: 1.3 mg/dL — AB (ref 0.44–1.00)

## 2017-12-10 MED ORDER — IOPAMIDOL (ISOVUE-300) INJECTION 61%
100.0000 mL | Freq: Once | INTRAVENOUS | Status: AC | PRN
  Administered 2017-12-10: 80 mL via INTRAVENOUS

## 2017-12-29 DIAGNOSIS — G250 Essential tremor: Secondary | ICD-10-CM | POA: Diagnosis not present

## 2017-12-30 DIAGNOSIS — G8929 Other chronic pain: Secondary | ICD-10-CM | POA: Diagnosis not present

## 2017-12-30 DIAGNOSIS — M545 Low back pain: Secondary | ICD-10-CM | POA: Diagnosis not present

## 2017-12-30 DIAGNOSIS — M150 Primary generalized (osteo)arthritis: Secondary | ICD-10-CM | POA: Diagnosis not present

## 2018-01-07 DIAGNOSIS — R1013 Epigastric pain: Secondary | ICD-10-CM | POA: Diagnosis not present

## 2018-01-07 DIAGNOSIS — K219 Gastro-esophageal reflux disease without esophagitis: Secondary | ICD-10-CM | POA: Diagnosis not present

## 2018-02-16 ENCOUNTER — Encounter: Payer: Self-pay | Admitting: Physical Therapy

## 2018-02-16 ENCOUNTER — Ambulatory Visit: Payer: Medicare HMO | Attending: Family Medicine | Admitting: Physical Therapy

## 2018-02-16 ENCOUNTER — Other Ambulatory Visit: Payer: Self-pay

## 2018-02-16 DIAGNOSIS — M545 Low back pain: Secondary | ICD-10-CM | POA: Diagnosis not present

## 2018-02-16 DIAGNOSIS — R262 Difficulty in walking, not elsewhere classified: Secondary | ICD-10-CM | POA: Insufficient documentation

## 2018-02-16 DIAGNOSIS — G8929 Other chronic pain: Secondary | ICD-10-CM | POA: Insufficient documentation

## 2018-02-16 NOTE — Therapy (Addendum)
[place] MAIN [facility] SERVICES [address] [place], [place], [address] Phone: [phone]   Fax:  [phone]  Physical Therapy Wheelchair Evaluation  Patient Details  Name: Alexa Lowe MRN: [id number] Date of Birth: 1930-08-11 No data recorded  Encounter Date: 02/16/2018  PT End of Session - 02/16/18 1521    Visit Number  1    Number of Visits  1    Date for PT Re-Evaluation  02/16/18    PT Start Time  6237    PT Stop Time  1645    PT Time Calculation (min)  90 min    Activity Tolerance  Patient tolerated treatment well    Behavior During Therapy  [facility] for tasks assessed/performed       Past Medical History:  Diagnosis Date  . Cancer [facility])    Right breast  . COPD (chronic obstructive pulmonary disease) ([place])   . Diabetes mellitus without complication ([place])   . Hypertension     Past Surgical History:  Procedure Laterality Date  . ABDOMINAL SURGERY    . APPENDECTOMY    . BACK SURGERY    . BREAST SURGERY    . CHOLECYSTECTOMY      There were no vitals filed for this visit.     PATIENT INFORMATION: This Evaluation form will serve as the LMN for the following suppliers:  Supplier: [employer] [facility] [employer] Contact Person: Felton Clinton Phone: [phone]   Reason for Referral: Obtain a new powerchair  Patient/caregiver Goals: Obtain a new powerchair  Patient was seen for face-to-face evaluation for replacement power wheelchair. Further paperwork was completed and sent to vendor.  Patient appears to qualify for power mobility device at this time per objective findings.   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 Patient does have COPD but is not on home oxygen. She does use an inhaler. She also has intermittent swelling in BLE. Patient has neuropathy secondary to Diabetes with numbness from feet to knees.       HOME ENVIRONMENT: _1 House  _2 Condo/town home  _3 Apartment  _4 Assisted Living    _5 Lives Alone _6  Lives with Others                                                    Hours with caregiver: Friends bringing in foot; family discussing getting her caregiver assistance  _7 Home is accessible to patient            Stairs  _8 Yes _9  No     Ramp _10 Yes _11 No Comments:  No stairs to get into the house or inside the apartment   COMMUNITY ADL: TRANSPORTATION: _12 Car    _13 Van    <YWVPXTGGYIRSWNIO>_2<\/VOJJKKXFGHWEXHBZ>_16 Public Transportation    _15 Adapted w/c Lift   _16 Ambulance   _17 Other:       _18 Sits in wheelchair during transport  Employment/School:  N/A  Specific requirements pertaining to mobility                                                     Other:                                       FUNCTIONAL/SENSORY PROCESSING SKILLS:  Handedness:   _0 Right     _1 Left    _2 NA  Comments:                                 Functional Processing Skills for Wheeled Mobility _3 Processing Skills are adequate for safe wheelchair operation  Areas of concern than may interfere with safe operation of wheelchair Description of problem   _4  Attention to environment     _5 Judgment     _6  Hearing  _7  Vision or visual processing    _8 Motor Planning  _9  Fluctuations in Behavior                                                   VERBAL COMMUNICATION: _10 WFL receptive _11  WFL expressive _12 Understandable  _13 Difficult to understand  _14 non-communicative _15  Uses an augmented communication device    CURRENT SEATING / MOBILITY: Current Mobility  Base:   _16 None  _17 Dependent  _18 Manual  _19 Scooter  _20 Power   Type of Control: Joystick                      Manufacturer: [employer]                        Size:                         Age: 15 years                           Current Condition of Mobility Base:   Has normal wear and tear for 5 years with multiple scrapes and scuffs.                                                                                                                  Current Wheelchair components: Joystick control twists off the armrest with difficulty using joystick, will not always charge, trouble turning on/off  Describe posture in present seating system: N/A                                                                           SENSATION and SKIN ISSUES: Sensation _0 Intact _1 Impaired _2 Absent   Level of sensation:     Limited light toe from knee to feet; intact deep pressure                        Pressure Relief: Able to perform effective pressure relief :   _3 Yes  _4  No Method:                                                                         Able to lean side to side; can stand intermittently but has trouble with dizziness when standing      If not, Why?:                                                                          Skin Issues/Skin Integrity Current Skin Issues   _5 Yes _6 No  _7 Intact _8  Red area _9  Open Area  _10 Scar Tissue _11 At risk from prolonged sitting  Where                              History of Skin Issues   _12 Yes _13 No  Where                                         When                                               Hx of skin flap surgeries _14 Yes _15 No  Where                  When                                                  Limited sitting tolerance _16 Yes _17 No Hours spent sitting in wheelchair daily: 4-5 hours  Complaint of Pain:  Please describe: Low back pain so must stand and readjust secondary to pain                                                                                                             Swelling/Edema:  BLE swelling intermittent, relieved with elevation                                                                                                                                               ADL STATUS (in reference to wheelchair use):  Indep Assist Unable Indep with Equip Not assessed Comments  Dressing                  X                                  Difficulty hooking bra with R shoulder OA                     Eating     X                                                                                                                         Toileting     X  Bathing      X                                                                                                                                Grooming/ Hygiene      X                                                                                                                        Meal Prep                           X                                                                                               IADLS    X                                                                                                              Bowel Management: _0 Continent  _1 Incontinent  _2 Accidents Comments:                                                  Bladder Management: _3 Continent  _4 Incontinent  _5 Accidents Comments: Wear a pad sometimes at night  WHEELCHAIR SKILLS: Manual w/c Propulsion: _0 UE or LE strength and endurance sufficient to participate in ADLs using manual wheelchair Arm :  _1 left _2 right  _3 Both                                    Foot:   _4 left _5 right  _6 Both  Distance:   Operate Scooter: _7  Strength, hand grip, balance and transfer appropriate for use _8 Living environment is accessible for use of scooter  Operate Power w/c:  _9  Std. Joystick   _10  Alternative Controls Indep _11  Assist _12  Dependent/ Unable _13  N/A _14  _15 Safe          _16  Functional      Distance:                Bed confined without wheelchair _17  Yes _18  No   STRENGTH/RANGE OF MOTION:  Range of Motion Strength  Shoulder    Limited secondary to pain, R > L limitations                                               Weakness greater in R > L; 2-/5 in R, 2/5 in L                                                          Elbow   WFL                                                3/5 grossly BUE                                                             Wrist/Hand   WFL                                                                L 20 lbs, R 5 lbs                                                                      Hip   Decreased hip flexion B  2-/5 on L, 2+/5 on R                                                             Knee   Can reach full ext but painful                                                            Cannot tolerate any resistance on R, 3/5 on L                                                               Ankle Some DF and PF present but not full range   R cannot tolerate any resistance, L tolerate mild resistance                                                                    MOBILITY/BALANCE:  _0  Patient is totally dependent for mobility                                                                                               Balance Transfers Ambulation  Sitting Balance: Standing Balance: _1  Independent _2  Independent/Modified Independent  _3  WFL     _4  WFL _5  Supervision _6  Supervision  _7  Uses UE for balance  _8  Supervision _9  Min  Assist _10  Ambulates with Assist                           _11  Min Assist _12  Min assist _13  Mod Assist _14  Ambulates with Device:  _15  RW   _16  StW   _17  Cane   _18  Rollator               _19  Mod Assist _20  Mod assist _21  Max assist   _22  Max Assist _23  Max assist _24  Dependent _25  Indep. Short Distance Only  _26  Unable _27  Unable _28  Lift / Sling Required Distance (in feet)           35 feet (10 meter walk 0.23 m/s; homebound, high fall risk, limited home ambulator)                   _29  Sliding board _30  Unable to Ambulate: (Explain:  Cardio Status:  _31 Intact  [  x] Impaired   _0  NA                              Respiratory Status:  _1 Intact   _2 Impaired   _3 NA                                     Orthotics/Prosthetics: N/A                                                                        Comments (Address manual vs power w/c vs scooter): Lacks UE strength and mobility for manual chair; due to chronic LBP and trunk control, not appropriate for scooter. With recent falls, not safe for scooter transfers.  A power wheelchair would be necessary for safe functional mobility in her home.                                                           Anterior / Posterior Obliquity Rotation-Pelvis  PELVIS    _4 Neutral  _5  Posterior  _6  Anterior     _7 WFL  _8 Right Elevated  _9 Left Elevated   _10 WFL  _11 Right Anterior _12   Left Anterior    _13  Fixed _14  Partly Flexible _15  Flexible  _16  Other  _17  Fixed  _18  Partly Flexible  _19  Flexible _20  Other  _21  Fixed  _22  Partly Flexible  _23  Flexible _24  Other  TRUNK _25 WFL _26 Thoracic Kyphosis _27 Lumbar Lordosis   _28  WFL _29 Convex Right _30 Convex Left   _31 c-curve _32 s-curve _33 multiple  _34  Neutral _35  Left-anterior _36  Right-anterior    _37  Fixed _38  Flexible _39  Partly Flexible       Other  _40  Fixed _41  Flexible _42  Partly Flexible _43  Other  _44  Fixed           _45  Flexible _46  Partly Flexible _47  Other   Position Windswept   HIPS  _48   Neutral _49  Abduct _50  ADduct _51  Neutral _52  Right _53  Left       _54  Fixed  _55  Partly Flexible             _56  Dislocated _57  Flexible _58  Subluxed    _59  Fixed _60  Partly Flexible  _61  Flexible _62  Other              Foot Positioning Knee Positioning   Knees and  Feet  _63  WFL _64 Left _65 Right _66  [facility] _67 Left _68 Right   KNEES ROM concerns: ROM concerns:   & Dorsi-Flexed                    _69 Lt _70 Rt                                  FEET Plantar Flexed                  _71 Lt _72 Rt     Inversion                    _73   Lt _0 Rt     Eversion                    _1 Lt _2 Rt    HEAD _3  Functional _4  Good Head Control   & _5  Flexed         _6  Extended _7  Adequate Head Control   NECK _8  Rotated  Lt  _9  Lat Flexed Lt _10  Rotated  Rt _11  Lat Flexed Rt _12  Limited Head Control    _13  Cervical Hyperextension _14  Absent  Head Control    SHOULDERS ELBOWS WRIST& HAND         Left     Right    Left     Right  U/E _15 Functional  Left            _16 Functional  Right                                 _17 Fisting             _18 Fisting     _19 elevated Left _20 depressed  Left _21 elevated Right _22 depressed  Right      _23 protracted Left _24 retracted Left _25 protracted Right _26 retracted Right _27 subluxed  Left              _28 subluxed  Right         Goals for Wheelchair Mobility  _29  Independence with mobility in the home with motor related ADLs (MRADLs)  _30  Independence with MRADLs in the community _31  Provide dependent mobility  _32  Provide recline     _33 Provide tilt   Goals for Seating system _34  Optimize pressure distribution _35  Provide support needed to facilitate function or safety _36  Provide corrective forces to assist with maintaining or improving posture _37  Accommodate client's posture: current seated postures and positions are not flexible or will not tolerate corrective forces _38  Client to be independent with relieving pressure in the wheelchair _39 Enhance physiological function such as breathing,  swallowing, digestion  Simulation ideas/Equipment trials: Did not try manual chair due to not able and not safe for scooter attempt                                                                                              State why other equipment was unsuccessful:                                                                                 MOBILITY BASE RECOMMENDATIONS and JUSTIFICATION: MOBILITY COMPONENT JUSTIFICATION  Manufacturer: Building services engineer:  [employer]  Size: Width           Seat Depth             _40 provide transport from point A  to B _0 promote Indep mobility  _1 is not a safe, functional ambulator _2 walker or cane inadequate _3 non-standard width/depth necessary to accommodate anatomical measurement _4                             _5 Manual Mobility Base _6 non-functional ambulator    _7 Scooter/POV  _8 can safely operate  _9 can safely transfer   _10 has adequate trunk stability  _11 cannot functionally propel manual w/c  _12 Power Mobility Base  _13 non-functional ambulatory  _14 cannot functionally propel manual wheelchair  _15  cannot functionally and safely operate scooter/POV _16 can safely operate and willing to  _17 Stroller Base _18 infant/child  _19 unable to propel manual wheelchair _20 allows for growth _21 non-functional ambulator _22 non-functional UE _23 Indep mobility is not a goal at this time  _24 Tilt  _25 Forward                   _26 Backward                  _27 Powered tilt              _28 Manual tilt  _29 change position against gravitational force on head and shoulders  _30 change position for pressure relief/cannot weight shift _31 transfers  _32 management of tone _33 rest periods _34 control edema _35 facilitate postural control  _36                                       _37 Recline  _38 Power recline on power base _39 Manual recline on manual base  _40 accommodate femur to back angle  _41 bring to full recline for ADL care  _42 change position for pressure relief/cannot weight shift  _43 rest periods _44 repositioning for transfers or clothing/diaper /catheter changes _45 head positioning  _46 Lighter weight required _47 self- propulsion  _48 lifting _49                                                 _50 Heavy Duty required _51 user weight greater than 250# _52 extreme tone/ over active movement _53 broken frame on previous chair _54                                     _55  Back  _56  Angle Adjustable _57  Custom molded                           _58 postural control _59 control of tone/spasticity _60 accommodation of range of motion _61 UE functional control _62 accommodation for seating system _63                                          _64 provide lateral trunk support _65 accommodate deformity _66 provide posterior trunk support _67 provide lumbar/sacral support _68 support trunk in midline _69 Pressure relief over spinal processes  _70  Seat Cushion                       _71 impaired sensation  _72 decubitus ulcers present _73 history of pressure ulceration _74 prevent pelvic extension _75 low maintenance  _76 stabilize pelvis  _77 accommodate obliquity _78 accommodate multiple deformity _79 neutralize lower extremity position _80 increase pressure distribution _81                                           _82   Pelvic/thigh support  _0  Lateral thigh guide _1  Distal medial pad  _2  Distal lateral pad _3  pelvis in neutral _4 accommodate pelvis _5  position upper legs _6  alignment _7  accommodate ROM _8  decrease adduction _9 accommodate tone _10 removable for transfers _11 decrease abduction  _12  Lateral trunk Supports _13  Lt     _14  Rt _15 decrease lateral trunk leaning _16 control tone _17 contour for increased contact _18 safety  _19 accommodate asymmetry _20                                                 _21  Mounting hardware  _22 lateral trunk supports  _23 back   _24 seat _25 headrest      _26  thigh support _27 fixed   _28 swing away _29 attach seat platform/cushion to w/c frame _30 attach back cushion to w/c frame _31 mount postural  supports _32 mount headrest  _33 swing medial thigh support away _34 swing lateral supports away for transfers  _35                                                     Armrests  _36 fixed _37 adjustable height _38 removable   _39 swing away  _40 flip back   _41 reclining _42 full length pads _43 desk    _44 pads tubular  _45 provide support with elbow at 90   _46 provide support for w/c tray _47 change of height/angles for variable activities _48 remove for transfers _49 allow to come closer to table top _50 remove for access to tables _51                                               Hangers/ Leg rests  _52 60 _53 70 _54 90 _55 elevating _56 heavy duty  _57 articulating _58 fixed _59 lift off _60 swing away     _61 power _62 provide LE support  _63 accommodate to hamstring tightness _64 elevate legs during recline   _65 provide change in position for Legs _66 Maintain placement of feet on footplate _67 durability _68 enable transfers _69 decrease edema _70 Accommodate lower leg length _71                                         Foot support Footplate    <ZOXWRUEAVWUJWJXB>_1<\/YNWGNFAOZHYQMVHQ>_46 Lt  _73  Rt  _74  Center mount _75 flip up                            _76 depth/angle adjustable _77 Amputee adapter    _78  Lt     _79  Rt _80 provide foot support _81 accommodate to ankle ROM _82 transfers _83 Provide support for residual extremity _84  allow foot to go under wheelchair base _85  decrease tone  _86                                                 _87  Ankle strap/heel loops _88 support foot on foot support _89 decrease extraneous movement _90 provide input to heel  _91 protect foot  Tires: _92 pneumatic  _93 flat free inserts  _94 solid  _95 decrease maintenance  _96 prevent frequent flats _97 increase shock absorbency _98 decrease pain from  road shock _0 decrease spasms from road shock _1                                              _2  Headrest  _3 provide posterior head support _4 provide posterior neck support _5 provide lateral head support _6 provide anterior head support _7 support during tilt and  recline _8 improve feeding   _9 improve respiration _10 placement of switches _11 safety  _12 accommodate ROM  _13 accommodate tone _14 improve visual orientation  _15  Anterior chest strap _16  Vest _17  Shoulder retractors  _18 decrease forward movement of shoulder _19 accommodation of TLSO _20 decrease forward movement of trunk _21 decrease shoulder elevation _22 added abdominal support _23 alignment _24 assistance with shoulder control  _25                                               Pelvic Positioner _26 Belt _27 SubASIS bar _28 Dual Pull _29 stabilize tone _30 decrease falling out of chair/ **will not Decrease potential for sliding due to pelvic tilting _31 prevent excessive rotation _32 pad for protection over boney prominence _33 prominence comfort _34 special pull angle to control rotation _35                                                  Upper ExtremitySupport  _36 L   _37  R _38 Arm trough   _39 hand support _40  tray       _41 full tray _42 swivel mount _43 decrease edema      _44 decrease subluxation   _45 control tone   _46 placement for AAC/Computer/EADL _47 decrease gravitational pull on shoulders _48 provide midline positioning _49 provide support to increase UE function _50 provide hand support in natural position _51 provide work surface   POWER WHEELCHAIR CONTROLS  _52 Proportional  _53 Non-Proportional Type                                      _54 Left  _55 Right _56 provides access for controlling wheelchair   _57 lacks motor control to operate proportional drive control <ZRAQTMAUQJFHLKTG>_2<\/BWLSLHTDSKAJGOTL>_57 unable to understand proportional controls  Actuator Control Module  _59 Single  _60 Multiple   _61 Allow the client to operate the power seat function(s) through the joystick control   _62 Safety Reset Switches _63 Used to change modes and stop the wheelchair when driving in latch mode    _64 [employer]   _65 programming for accurate control _66 progressive Disease/changing condition _67 non-proportional drive control needed _68 Needed in order to operate power seat  functions through joystick control   _69 Display box _70 Allows user to see in which mode and drive the wheelchair is set  _71 necessary for alternate controls    _72 Digital interface electronics _73 Allows w/c to operate when using alternative drive controls  <WIOMBTDHRCBULAGT>_3<\/MIWOEHOZYYQMGNOI>_37 ASL Head Array _75 Allows client to operate wheelchair  through switches placed in tri-panel headrest  _76 Sip and puff with tubing kit _77 needed to operate sip and puff drive controls  <CWUGQBVQXIHWTUUE>_2<\/CMKLKJZPHXTAVWPV>_94 Upgraded tracking electronics _79 increase safety when driving <IAXKPVVZSMOLMBEM>_7<\/JQGBEEFEOFHQRFXJ>_88 correct tracking when on uneven surfaces  _81 [facility] for switches or joystick _82 Attaches switches to w/c  _83 Swing away for access or transfers _84 midline for optimal placement _85 provides for consistent access  _86 Attendant controlled joystick plus mount _87 safety _88 long distance driving <TGPQDIYMEBRAXENM>_0<\/HWKGSUPJSRPRXYVO>_59 operation of seat functions _90 compliance with transportation  regulations _0                                             Rear wheel placement/Axle adjustability _1 None _2 semi adjustable _3 fully adjustable  _4 improved UE access to wheels _5 improved stability _6 changing angle in space for improvement of postural stability _7 1-arm drive access <ZOXWRUEAVWUJWJXB>_1<\/YNWGNFAOZHYQMVHQ>_4 amputee pad placement _9                                Wheel rims/ hand rims  _10 metal   _11 plastic coated _12 oblique projections           _13 vertical projections _14 Provide ability to propel manual wheelchair  _15  Increase self-propulsion with hand weakness/decreased grasp  Push handles _16 extended   _17 angle adjustable              _18 standard _19 caregiver access _20 caregiver assist _21 allows "hooking" to enable increased ability to perform ADLs or maintain balance  One armed device   _22 Lt   _23 Rt _24 enable propulsion of manual wheelchair with one arm   _25                                            Brake/wheel lock extension _26  Lt   _27  Rt _28 increase indep in applying wheel locks   _29 Side guards _30 prevent clothing getting caught in wheel or becoming soiled _31  prevent skin  tears/abrasions  Battery: U1 AGM Battery                                            _32 to power wheelchair                                                         Other:                                                                                                                        The above equipment has a life- long use expectancy. Growth and changes in medical and/or functional conditions would be the exceptions. This is to certify that the therapist has no financial relationship with durable medical provider or manufacturer. The therapist will not receive remuneration of any kind for the equipment recommended in this evaluation.   Patient has mobility limitation that significantly impairs safe, timely participation in one or more mobility related ADL's. (bathing, toileting, feeding, dressing, grooming, moving from room to room)  _33  Yes _34  No  Will mobility device sufficiently  improve ability to participate and/or be aided in participation of MRADL's?      _0  Yes _1  No  Can limitation be compensated for with use of a cane or walker?                                    _2  Yes _3  No  Does patient or caregiver demonstrate ability/potential ability & willingness to safely use the mobility device?    _4  Yes _5  No  Does patient's home environment support use of recommended mobility device?            _6  Yes _7  No  Does patient have sufficient upper extremity function necessary to functionally propel a manual wheelchair?     _8  Yes _9  No  Does patient have sufficient strength and trunk stability to safely operate a POV (scooter)?                                  _10  Yes _11  No  Does patient need additional features/benefits provided by a power wheelchair for MRADL's in the home?        _12  Yes _13  No  Does the patient demonstrate the ability to safely use a power wheelchair?                   _14  Yes _15  No     Physician's Name Printed:                                                         Physician's Signature:  Date:     This is to certify that I, the above signed therapist have the following affiliations: _16  This DME provider _17  Manufacturer of recommended equipment _18  Patient's long term care facility _19  None of the above  Therapist Name/Signature:                                            Date:        Objective measurements completed on examination: See above findings.                   PT Long Term Goals - 02/16/18 1624      PT LONG TERM GOAL #1   Title  Patient able to understand recommendations for power wheelchair.     Time  1    Period  Days    Status  Achieved    Target Date  02/16/18             Plan - 02/16/18 1622    Clinical Impression Statement  Patient presents for wheelchair evaluation. She seems to qualify for a new powerchair.     History and Personal Factors relevant to plan of care:  (+) family nearby (-) lives alone, chronicity of problem, fall history    Clinical Presentation  Evolving    Clinical Presentation due to:  recent fall history, chronicity of problem, comorbidities    Clinical Decision Making  Moderate    Rehab Potential  Good    Clinical  Impairments Affecting Rehab Potential  (+) family nearby (-) lives alone, chronicity of problem, fall history    PT Frequency  One time visit    Consulted and Agree with Plan of Care  Patient       Patient will benefit from skilled therapeutic intervention in order to improve the following deficits and impairments:  Abnormal gait, Decreased activity tolerance, Decreased mobility, Pain, Postural dysfunction  Visit Diagnosis: Chronic low back pain, unspecified back pain laterality, unspecified whether sciatica present  Difficulty in walking, not elsewhere classified     Problem List There are no active problems to display for this patient.  Harriet Masson, SPT This entire session was performed under direct supervision and direction of a licensed  therapist/therapist assistant . I have personally read, edited and approve of the note as written.  Trotter,Margaret PT, DPT 02/17/2018, 8:33 AM  [place] [facility] [address] [place], [place], [address] Phone: [phone]   Fax:  [phone]  Name: Alexa Lowe MRN: [id number] Date of Birth: 12/08/1930

## 2018-02-19 DIAGNOSIS — Z9889 Other specified postprocedural states: Secondary | ICD-10-CM | POA: Diagnosis not present

## 2018-02-19 DIAGNOSIS — J449 Chronic obstructive pulmonary disease, unspecified: Secondary | ICD-10-CM | POA: Diagnosis not present

## 2018-02-19 DIAGNOSIS — E119 Type 2 diabetes mellitus without complications: Secondary | ICD-10-CM | POA: Diagnosis not present

## 2018-02-19 DIAGNOSIS — N183 Chronic kidney disease, stage 3 (moderate): Secondary | ICD-10-CM | POA: Diagnosis not present

## 2018-02-19 DIAGNOSIS — E7800 Pure hypercholesterolemia, unspecified: Secondary | ICD-10-CM | POA: Diagnosis not present

## 2018-02-19 DIAGNOSIS — R0602 Shortness of breath: Secondary | ICD-10-CM | POA: Diagnosis not present

## 2018-02-19 DIAGNOSIS — I10 Essential (primary) hypertension: Secondary | ICD-10-CM | POA: Diagnosis not present

## 2018-03-03 DIAGNOSIS — E7800 Pure hypercholesterolemia, unspecified: Secondary | ICD-10-CM | POA: Diagnosis not present

## 2018-03-03 DIAGNOSIS — E119 Type 2 diabetes mellitus without complications: Secondary | ICD-10-CM | POA: Diagnosis not present

## 2018-03-09 DIAGNOSIS — E7800 Pure hypercholesterolemia, unspecified: Secondary | ICD-10-CM | POA: Diagnosis not present

## 2018-03-09 DIAGNOSIS — Z6841 Body Mass Index (BMI) 40.0 and over, adult: Secondary | ICD-10-CM | POA: Diagnosis not present

## 2018-03-09 DIAGNOSIS — E1169 Type 2 diabetes mellitus with other specified complication: Secondary | ICD-10-CM | POA: Diagnosis not present

## 2018-03-09 DIAGNOSIS — E785 Hyperlipidemia, unspecified: Secondary | ICD-10-CM | POA: Diagnosis not present

## 2018-03-09 DIAGNOSIS — N183 Chronic kidney disease, stage 3 (moderate): Secondary | ICD-10-CM | POA: Diagnosis not present

## 2018-03-09 DIAGNOSIS — M109 Gout, unspecified: Secondary | ICD-10-CM | POA: Diagnosis not present

## 2018-03-09 DIAGNOSIS — I10 Essential (primary) hypertension: Secondary | ICD-10-CM | POA: Diagnosis not present

## 2018-04-21 DIAGNOSIS — M150 Primary generalized (osteo)arthritis: Secondary | ICD-10-CM | POA: Diagnosis not present

## 2018-04-21 DIAGNOSIS — M545 Low back pain: Secondary | ICD-10-CM | POA: Diagnosis not present

## 2018-05-19 ENCOUNTER — Other Ambulatory Visit: Payer: Self-pay

## 2018-05-19 ENCOUNTER — Encounter: Payer: Self-pay | Admitting: Emergency Medicine

## 2018-05-19 ENCOUNTER — Inpatient Hospital Stay
Admission: EM | Admit: 2018-05-19 | Discharge: 2018-05-21 | DRG: 384 | Disposition: A | Discharge status: Home / Self Care | Payer: Medicare HMO | Attending: Internal Medicine | Admitting: Internal Medicine

## 2018-05-19 DIAGNOSIS — Z79899 Other long term (current) drug therapy: Secondary | ICD-10-CM | POA: Diagnosis not present

## 2018-05-19 DIAGNOSIS — K219 Gastro-esophageal reflux disease without esophagitis: Secondary | ICD-10-CM | POA: Diagnosis not present

## 2018-05-19 DIAGNOSIS — Z885 Allergy status to narcotic agent status: Secondary | ICD-10-CM

## 2018-05-19 DIAGNOSIS — Z853 Personal history of malignant neoplasm of breast: Secondary | ICD-10-CM

## 2018-05-19 DIAGNOSIS — Z66 Do not resuscitate: Secondary | ICD-10-CM | POA: Diagnosis present

## 2018-05-19 DIAGNOSIS — K3189 Other diseases of stomach and duodenum: Secondary | ICD-10-CM | POA: Diagnosis not present

## 2018-05-19 DIAGNOSIS — I152 Hypertension secondary to endocrine disorders: Secondary | ICD-10-CM | POA: Diagnosis present

## 2018-05-19 DIAGNOSIS — D649 Anemia, unspecified: Secondary | ICD-10-CM | POA: Diagnosis not present

## 2018-05-19 DIAGNOSIS — J449 Chronic obstructive pulmonary disease, unspecified: Secondary | ICD-10-CM | POA: Diagnosis not present

## 2018-05-19 DIAGNOSIS — Z7982 Long term (current) use of aspirin: Secondary | ICD-10-CM

## 2018-05-19 DIAGNOSIS — E1159 Type 2 diabetes mellitus with other circulatory complications: Secondary | ICD-10-CM | POA: Diagnosis present

## 2018-05-19 DIAGNOSIS — E119 Type 2 diabetes mellitus without complications: Secondary | ICD-10-CM

## 2018-05-19 DIAGNOSIS — Z7984 Long term (current) use of oral hypoglycemic drugs: Secondary | ICD-10-CM | POA: Diagnosis not present

## 2018-05-19 DIAGNOSIS — K259 Gastric ulcer, unspecified as acute or chronic, without hemorrhage or perforation: Principal | ICD-10-CM | POA: Diagnosis present

## 2018-05-19 DIAGNOSIS — R1013 Epigastric pain: Secondary | ICD-10-CM | POA: Diagnosis not present

## 2018-05-19 DIAGNOSIS — K921 Melena: Secondary | ICD-10-CM | POA: Diagnosis not present

## 2018-05-19 DIAGNOSIS — R079 Chest pain, unspecified: Secondary | ICD-10-CM | POA: Diagnosis not present

## 2018-05-19 DIAGNOSIS — Z87891 Personal history of nicotine dependence: Secondary | ICD-10-CM | POA: Diagnosis not present

## 2018-05-19 DIAGNOSIS — I10 Essential (primary) hypertension: Secondary | ICD-10-CM | POA: Diagnosis present

## 2018-05-19 LAB — CBC WITH DIFFERENTIAL/PLATELET
ABS IMMATURE GRANULOCYTES: 0.04 10*3/uL (ref 0.00–0.07)
BASOS ABS: 0 10*3/uL (ref 0.0–0.1)
BASOS PCT: 0 %
EOS ABS: 0.1 10*3/uL (ref 0.0–0.5)
Eosinophils Relative: 1 %
HEMATOCRIT: 25.1 % — AB (ref 36.0–46.0)
Hemoglobin: 6.7 g/dL — ABNORMAL LOW (ref 12.0–15.0)
IMMATURE GRANULOCYTES: 0 %
LYMPHS ABS: 1.7 10*3/uL (ref 0.7–4.0)
Lymphocytes Relative: 19 %
MCH: 19.7 pg — ABNORMAL LOW (ref 26.0–34.0)
MCHC: 26.7 g/dL — ABNORMAL LOW (ref 30.0–36.0)
MCV: 73.8 fL — ABNORMAL LOW (ref 80.0–100.0)
Monocytes Absolute: 0.8 10*3/uL (ref 0.1–1.0)
Monocytes Relative: 9 %
NEUTROS ABS: 6.4 10*3/uL (ref 1.7–7.7)
NEUTROS PCT: 71 %
NRBC: 0 % (ref 0.0–0.2)
PLATELETS: 265 10*3/uL (ref 150–400)
RBC: 3.4 MIL/uL — ABNORMAL LOW (ref 3.87–5.11)
RDW: 19.9 % — AB (ref 11.5–15.5)
WBC: 9 10*3/uL (ref 4.0–10.5)

## 2018-05-19 LAB — COMPREHENSIVE METABOLIC PANEL
ALK PHOS: 61 U/L (ref 38–126)
ALT: 9 U/L (ref 0–44)
ANION GAP: 8 (ref 5–15)
AST: 21 U/L (ref 15–41)
Albumin: 3.8 g/dL (ref 3.5–5.0)
BUN: 13 mg/dL (ref 8–23)
CALCIUM: 9 mg/dL (ref 8.9–10.3)
CO2: 25 mmol/L (ref 22–32)
Chloride: 106 mmol/L (ref 98–111)
Creatinine, Ser: 1.07 mg/dL — ABNORMAL HIGH (ref 0.44–1.00)
GFR calc non Af Amer: 47 mL/min — ABNORMAL LOW (ref >60)
GFR, EST AFRICAN AMERICAN: 55 mL/min — AB (ref >60)
Glucose, Bld: 111 mg/dL — ABNORMAL HIGH (ref 70–99)
POTASSIUM: 3.5 mmol/L (ref 3.5–5.1)
SODIUM: 139 mmol/L (ref 135–145)
Total Bilirubin: 1.2 mg/dL (ref 0.3–1.2)
Total Protein: 6.4 g/dL — ABNORMAL LOW (ref 6.5–8.1)

## 2018-05-19 LAB — URINALYSIS, COMPLETE (UACMP) WITH MICROSCOPIC
Bilirubin Urine: NEGATIVE
Glucose, UA: NEGATIVE mg/dL
Hgb urine dipstick: NEGATIVE
Ketones, ur: NEGATIVE mg/dL
Nitrite: NEGATIVE
PROTEIN: 30 mg/dL — AB
Specific Gravity, Urine: 1.025 (ref 1.005–1.030)
pH: 5 (ref 5.0–8.0)

## 2018-05-19 LAB — ABO/RH: ABO/RH(D): O POS

## 2018-05-19 LAB — PROTIME-INR
INR: 1.18
Prothrombin Time: 14.9 s (ref 11.4–15.2)

## 2018-05-19 LAB — PREPARE RBC (CROSSMATCH)

## 2018-05-19 LAB — TROPONIN I: Troponin I: <0.03 ng/mL (ref <0.03)

## 2018-05-19 MED ORDER — SODIUM CHLORIDE 0.9 % IV SOLN
80.0000 mg | Freq: Once | INTRAVENOUS | Status: AC
  Administered 2018-05-19: 80 mg via INTRAVENOUS
  Filled 2018-05-19: qty 80

## 2018-05-19 MED ORDER — SODIUM CHLORIDE 0.9 % IV SOLN
8.0000 mg/h | INTRAVENOUS | Status: DC
  Administered 2018-05-19 – 2018-05-21 (×5): 8 mg/h via INTRAVENOUS
  Filled 2018-05-19 (×4): qty 80

## 2018-05-19 MED ORDER — SODIUM CHLORIDE 0.9% IV SOLUTION
Freq: Once | INTRAVENOUS | Status: AC
  Administered 2018-05-19: 22:00:00 via INTRAVENOUS
  Filled 2018-05-19: qty 250

## 2018-05-19 NOTE — ED Provider Notes (Signed)
[facility] Emergency Department Provider Note ____________________________________________   First MD Initiated Contact with Patient 05/19/18 1940     (approximate)  I have reviewed the triage vital signs and the nursing notes.   HISTORY  Chief Complaint No chief complaint on file.    HPI [name] is a 85 y.o. female with PMH as noted below who presents with anemia, referred by her gastroenterologist Dr. Gustavo Lah.  The patient reports that she has been feeling weak for at least a few weeks, gradual onset, and worsening, she also reports epigastric and lower chest pain which has been chronic for several months.  The patient is scheduled for an EGD next month.  She had blood drawn today at the GI doctors office and was instructed to come to the emergency department because of a low hemoglobin.  The patient does report dark stools.  Past Medical History:  Diagnosis Date  . Cancer [facility])    Right breast  . COPD (chronic obstructive pulmonary disease) ([place])   . Diabetes mellitus without complication ([place])   . Hypertension     Patient Active Problem List   Diagnosis Date Noted  . Symptomatic anemia 05/19/2018  . HTN (hypertension) 05/19/2018  . Diabetes ([place]) 05/19/2018  . COPD (chronic obstructive pulmonary disease) ([place]) 05/19/2018    Past Surgical History:  Procedure Laterality Date  . ABDOMINAL SURGERY    . APPENDECTOMY    . BACK SURGERY    . BREAST SURGERY    . CHOLECYSTECTOMY      Prior to Admission medications   Medication Sig Start Date End Date Taking? Authorizing Provider  albuterol (PROVENTIL HFA;VENTOLIN HFA) 108 (90 Base) MCG/ACT inhaler Inhale 2 puffs into the lungs every 4 (four) hours as needed for wheezing or shortness of breath. 02/26/17  Yes [provider]  allopurinol (ZYLOPRIM) 300 MG tablet Take 300 mg by mouth daily.   Yes [provider]  aspirin EC 325 MG tablet Take 325 mg by mouth daily.   Yes  [provider]  atorvastatin (LIPITOR) 40 MG tablet Take 40 mg by mouth every evening.    Yes [provider]  celecoxib (CELEBREX) 200 MG capsule Take 200 mg by mouth 2 (two) times daily.    Yes [provider]  citalopram (CELEXA) 20 MG tablet Take 30 mg by mouth daily.    Yes [provider]  clotrimazole-betamethasone (LOTRISONE) cream Apply 1 application topically 2 (two) times daily. 05/06/18 05/20/18 Yes [provider]  gabapentin (NEURONTIN) 100 MG capsule Take 100 mg by mouth at bedtime as needed for tremors. 04/13/18  Yes [provider]  gabapentin (NEURONTIN) 300 MG capsule Take 300 mg by mouth 3 (three) times daily.    Yes [provider]  losartan (COZAAR) 50 MG tablet Take 50 mg by mouth daily. 03/09/18  Yes [provider]  metFORMIN (GLUCOPHAGE) 500 MG tablet Take 500 mg by mouth 2 (two) times daily.    Yes [provider]  pantoprazole (PROTONIX) 40 MG tablet Take 40 mg by mouth 2 (two) times daily.    Yes [provider]  alum & mag hydroxide-simeth (MAALOX REGULAR STRENGTH) 200-200-20 MG/5ML suspension Take 30 mLs by mouth every 4 (four) hours as needed for indigestion or heartburn. 07/18/17   Harvest Dark, MD  HYDROcodone-acetaminophen (NORCO/VICODIN) 5-325 MG tablet Take 1 tablet every 6-8 hours prn pain Patient not taking: Reported on 07/18/2017 02/09/17   Johnn Hai, PA-C  predniSONE (DELTASONE)  10 MG tablet Take 2 tablets once a day for 3 days starting Monday Patient not taking: Reported on 07/18/2017 02/09/17   Johnn Hai, PA-C    Allergies Morphine and related and Codeine  History reviewed. No pertinent family history.  Social History Social History   Tobacco Use  . Smoking status: Former Research scientist (life sciences)  . Smokeless tobacco: Never Used  Substance Use Topics  . Alcohol use: No  . Drug use: No    Review of Systems  Constitutional: No fever.  Positive for  weakness. Eyes: No redness. ENT: No sore throat. Cardiovascular: Denies chest pain. Respiratory: Denies shortness of breath. Gastrointestinal: Positive for epigastric pain.  Genitourinary: Positive for urinary frequency. Musculoskeletal: Negative for back pain. Skin: Negative for rash. Neurological: Negative for headache.   ____________________________________________   PHYSICAL EXAM:  VITAL SIGNS: ED Triage Vitals  Enc Vitals Group     BP 05/19/18 1908 (!) 166/55     Pulse Rate 05/19/18 1908 68     Resp 05/19/18 1908 14     Temp 05/19/18 1908 97.9 F (36.6 C)     Temp Source 05/19/18 1908 Oral     SpO2 05/19/18 1908 95 %     Weight 05/19/18 1902 212 lb (96.2 kg)     Height 05/19/18 1902 5\' 2"  (1.575 m)     Head Circumference --      Peak Flow --      Pain Score 05/19/18 1902 6     Pain Loc --      Pain Edu? --      Excl. in [place]? --     Constitutional: Alert and oriented. Well appearing for age and in no acute distress. Eyes: Conjunctivae are normal.  Head: Atraumatic. Nose: No congestion/rhinnorhea. Mouth/Throat: Mucous membranes are slightly dry.   Neck: Normal range of motion.  Cardiovascular: Normal rate, regular rhythm. Grossly normal heart sounds.  Good peripheral circulation. Respiratory: Normal respiratory effort.  No retractions. Lungs CTAB. Gastrointestinal: Soft with mild epigastric tenderness.  No distention.  Genitourinary: No flank tenderness. Musculoskeletal: Extremities warm and well perfused.  Neurologic:  Normal speech and language. No gross focal neurologic deficits are appreciated.  Skin:  Skin is warm and dry. No rash noted. Psychiatric: Mood and affect are normal. Speech and behavior are normal.  ____________________________________________   LABS (all labs ordered are listed, but only abnormal results are displayed)  Labs Reviewed  COMPREHENSIVE METABOLIC PANEL - Abnormal; Notable for the following components:      Result Value    Glucose, Bld 111 (*)    Creatinine, Ser 1.07 (*)    Total Protein 6.4 (*)    GFR calc non Af Amer 47 (*)    GFR calc Af Amer 55 (*)    All other components within normal limits  CBC WITH DIFFERENTIAL/PLATELET - Abnormal; Notable for the following components:   RBC 3.40 (*)    Hemoglobin 6.7 (*)    HCT 25.1 (*)    MCV 73.8 (*)    MCH 19.7 (*)    MCHC 26.7 (*)    RDW 19.9 (*)    All other components within normal limits  PROTIME-INR  TROPONIN I  URINALYSIS, COMPLETE (UACMP) WITH MICROSCOPIC  TYPE AND SCREEN  PREPARE RBC (CROSSMATCH)  ABO/RH   ____________________________________________  EKG  ED ECG REPORT I, Arta Silence, the attending physician, personally viewed and interpreted this ECG.  Date: 05/19/2018 EKG Time: 1909 Rate: 75 Rhythm: normal sinus rhythm QRS Axis: Left axis  Intervals: Prolonged QTc ST/T Wave abnormalities: normal Narrative Interpretation: no evidence of acute ischemia  ____________________________________________  RADIOLOGY    ____________________________________________   PROCEDURES  Procedure(s) performed: No  Procedures  Critical Care performed: Yes  CRITICAL CARE Performed by: Arta Silence   Total critical care time: 30 minutes  Critical care time was exclusive of separately billable procedures and treating other patients.  Critical care was necessary to treat or prevent imminent or life-threatening deterioration.  Critical care was time spent personally by me on the following activities: development of treatment plan with patient and/or surrogate as well as nursing, discussions with consultants, evaluation of patient's response to treatment, examination of patient, obtaining history from patient or surrogate, ordering and performing treatments and interventions, ordering and review of laboratory studies, ordering and review of radiographic studies, pulse oximetry and re-evaluation of patient's  condition. ____________________________________________   INITIAL IMPRESSION / ASSESSMENT AND PLAN / ED COURSE  Pertinent labs & imaging results that were available during my care of the patient were reviewed by me and considered in my medical decision making (see chart for details).  85 year old with PMH as noted above presents with generalized weakness and was referred and to the ED because of low hemoglobin on a CBC checked by her gastroenterologist today.  I reviewed the past medical records in [place].  The patient was evaluated by Dr. Gustavo Lah today.  She has been worked up for epigastric pain and is scheduled for an EGD next month.  On exam the patient is pale but otherwise relatively well-appearing and her vital signs are normal except for hypertension.  Her abdomen is soft with some epigastric discomfort but no focal tenderness.  The remainder of the exam is as described above.  CBC here confirms hemoglobin of 6.7.  The patient will require transfusion.  I suspect most likely GI source.  I will place the patient on a Protonix drip, consult gastroenterology, order blood, and admit the patient.  ----------------------------------------- 9:26 PM on 05/19/2018 -----------------------------------------  I consulted Dr. Bonna Gains from gastroenterology.  She agreed with the management so far and advised that the GI service would likely perform endoscopy tomorrow.  She asked that we keep the patient n.p.o. after midnight.  At this time the patient is hemodynamically stable.  I advised her on the results of the work-up and the plan of care.  I signed the patient out to the hospitalist Dr. Jannifer Franklin at approximately 9:25 PM. ____________________________________________   FINAL CLINICAL IMPRESSION(S) / ED DIAGNOSES  Final diagnoses:  Anemia, unspecified type  Epigastric pain      NEW MEDICATIONS STARTED DURING THIS VISIT:  New Prescriptions   No medications on file     Note:  This  document was prepared using Dragon voice recognition software and may include unintentional dictation errors.    Arta Silence, MD 05/19/18 2127

## 2018-05-19 NOTE — ED Notes (Signed)
Transfusion started at 2150

## 2018-05-19 NOTE — ED Triage Notes (Signed)
Pt presents to ED via [facility] from home c/o low hemoglobin. Pt states she is not sure exactly what hgb level was but her PCP told her to come to ED immediately. C/o mid-sternal chest pain and SOB.

## 2018-05-19 NOTE — H&P (Signed)
[place] [facility] [place] NAME: Alexa Lowe    MR#:  [id number]  DATE OF BIRTH:  1930-12-08  DATE OF ADMISSION:  05/19/2018  PRIMARY CARE PHYSICIAN: Alexa Body, MD   REQUESTING/REFERRING PHYSICIAN: Cherylann Banas, MD  CHIEF COMPLAINT:  Abnormal labs  HISTORY OF PRESENT ILLNESS:  Alexa Lowe  is a 85 y.o. female who presents with chief complaint as above.  Patient presents to the ED for evaluation after being found to have hemoglobin of 6.7 during routine lab work at outpatient GI clinic today.  She has been having's progressive fatigue with some increasing shortness of breath and was being worked up for possibility of a GI bleed.  She was sent to the ED for an patient evaluation given her significant anemia.  3 units PRBC transfusion were ordered in the ED and hospitalist were called for admission  PAST MEDICAL HISTORY:   Past Medical History:  Diagnosis Date  . Cancer [facility])    Right breast  . COPD (chronic obstructive pulmonary disease) ([place])   . Diabetes mellitus without complication ([place])   . Hypertension      PAST SURGICAL HISTORY:   Past Surgical History:  Procedure Laterality Date  . ABDOMINAL SURGERY    . APPENDECTOMY    . BACK SURGERY    . BREAST SURGERY    . CHOLECYSTECTOMY       SOCIAL HISTORY:   Social History   Tobacco Use  . Smoking status: Former Research scientist (life sciences)  . Smokeless tobacco: Never Used  Substance Use Topics  . Alcohol use: No     FAMILY HISTORY:    Family history reviewed and is non-contributory DRUG ALLERGIES:   Allergies  Allergen Reactions  . Morphine And Related Other (See Comments)    Reaction:  Hallucinations   . Codeine Nausea And Vomiting    MEDICATIONS AT HOME:   Prior to Admission medications   Medication Sig Start Date End Date Taking? Authorizing Provider  albuterol (PROVENTIL HFA;VENTOLIN HFA) 108 (90 Base) MCG/ACT inhaler Inhale 2 puffs into the lungs every 4 (four)  hours as needed for wheezing or shortness of breath. 02/26/17  Yes [provider]  allopurinol (ZYLOPRIM) 300 MG tablet Take 300 mg by mouth daily.   Yes [provider]  aspirin EC 325 MG tablet Take 325 mg by mouth daily.   Yes [provider]  atorvastatin (LIPITOR) 40 MG tablet Take 40 mg by mouth every evening.    Yes [provider]  celecoxib (CELEBREX) 200 MG capsule Take 200 mg by mouth 2 (two) times daily.    Yes [provider]  citalopram (CELEXA) 20 MG tablet Take 30 mg by mouth daily.    Yes [provider]  clotrimazole-betamethasone (LOTRISONE) cream Apply 1 application topically 2 (two) times daily. 05/06/18 05/20/18 Yes [provider]  gabapentin (NEURONTIN) 100 MG capsule Take 100 mg by mouth at bedtime as needed for tremors. 04/13/18  Yes [provider]  gabapentin (NEURONTIN) 300 MG capsule Take 300 mg by mouth 3 (three) times daily.    Yes [provider]  losartan (COZAAR) 50 MG tablet Take 50 mg by mouth daily. 03/09/18  Yes [provider]  metFORMIN (GLUCOPHAGE) 500 MG tablet Take 500 mg by mouth 2 (two) times daily.    Yes [provider]  pantoprazole (PROTONIX) 40 MG tablet Take 40 mg by mouth 2 (two) times daily.    Yes [provider]  alum &  mag hydroxide-simeth (MAALOX REGULAR STRENGTH) 200-200-20 MG/5ML suspension Take 30 mLs by mouth every 4 (four) hours as needed for indigestion or heartburn. 07/18/17   Harvest Dark, MD  HYDROcodone-acetaminophen (NORCO/VICODIN) 5-325 MG tablet Take 1 tablet every 6-8 hours prn pain Patient not taking: Reported on 07/18/2017 02/09/17   Johnn Hai, PA-C  predniSONE (DELTASONE) 10 MG tablet Take 2 tablets once a day for 3 days starting Monday Patient not taking: Reported on 07/18/2017 02/09/17   Johnn Hai, PA-C    REVIEW OF SYSTEMS:  Review of Systems  Constitutional: Positive for malaise/fatigue. Negative  for chills, fever and weight loss.  HENT: Negative for ear pain, hearing loss and tinnitus.   Eyes: Negative for blurred vision, double vision, pain and redness.  Respiratory: Positive for shortness of breath. Negative for cough and hemoptysis.   Cardiovascular: Negative for chest pain, palpitations, orthopnea and leg swelling.  Gastrointestinal: Negative for abdominal pain, constipation, diarrhea, nausea and vomiting.  Genitourinary: Negative for dysuria, frequency and hematuria.  Musculoskeletal: Negative for back pain, joint pain and neck pain.  Skin:       No acne, rash, or lesions  Neurological: Negative for dizziness, tremors, focal weakness and weakness.  Endo/Heme/Allergies: Negative for polydipsia. Does not bruise/bleed easily.  Psychiatric/Behavioral: Negative for depression. The patient is not nervous/anxious and does not have insomnia.      VITAL SIGNS:   Vitals:   05/19/18 1908 05/19/18 1930 05/19/18 2000 05/19/18 2015  BP: (!) 166/55 (!) 167/68    Pulse: 68 (!) 118 85 66  Resp: 14 20 (!) 21 20  Temp: 97.9 F (36.6 C)     TempSrc: Oral     SpO2: 95% 91% 93% 94%  Weight:      Height:       Wt Readings from Last 3 Encounters:  05/19/18 96.2 kg  07/18/17 99.8 kg  02/09/17 99.8 kg    PHYSICAL EXAMINATION:  Physical Exam  Vitals reviewed. Constitutional: She is oriented to person, place, and time. She appears well-developed and well-nourished. No distress.  HENT:  Head: Normocephalic and atraumatic.  Mouth/Throat: Oropharynx is clear and moist.  Eyes: Pupils are equal, round, and reactive to light. EOM are normal. No scleral icterus.  Pale conjunctiva  Neck: Normal range of motion. Neck supple. No JVD present. No thyromegaly present.  Cardiovascular: Normal rate, regular rhythm and intact distal pulses. Exam reveals no gallop and no friction rub.  No murmur heard. Respiratory: Effort normal and breath sounds normal. No respiratory distress. She has no wheezes.  She has no rales.  GI: Soft. Bowel sounds are normal. She exhibits no distension. There is no abdominal tenderness.  Musculoskeletal: Normal range of motion.        General: No edema.     Comments: No arthritis, no gout  Lymphadenopathy:    She has no cervical adenopathy.  Neurological: She is alert and oriented to person, place, and time. No cranial nerve deficit.  No dysarthria, no aphasia  Skin: Skin is warm and dry. No rash noted. No erythema.  Psychiatric: She has a normal mood and affect. Her behavior is normal. Judgment and thought content normal.    LABORATORY PANEL:   CBC Recent Labs  Lab 05/19/18 1906  WBC 9.0  HGB 6.7*  HCT 25.1*  PLT 265   ------------------------------------------------------------------------------------------------------------------  Chemistries  Recent Labs  Lab 05/19/18 1906  NA 139  K 3.5  CL 106  CO2 25  GLUCOSE 111*  BUN 13  CREATININE 1.07*  CALCIUM 9.0  AST 21  ALT 9  ALKPHOS 61  BILITOT 1.2   ------------------------------------------------------------------------------------------------------------------  Cardiac Enzymes Recent Labs  Lab 05/19/18 1913  TROPONINI <0.03   ------------------------------------------------------------------------------------------------------------------  RADIOLOGY:  No results found.  EKG:   Orders placed or performed during the hospital encounter of 05/19/18  . EKG 12-Lead  . EKG 12-Lead    IMPRESSION AND PLAN:  Principal Problem:   Symptomatic anemia -2 unit PRBC transfusion tonight.  Anemia is suspected to be due to possible intermittent GI bleed.  Patient does state that she has been having some epigastric abdominal pain, raising concern for the possibility of an ulcer.  Keep n.p.o. tonight, GI consult Active Problems:   HTN (hypertension) -continue home meds   Diabetes ([place]) -sliding scale insulin coverage   COPD (chronic obstructive pulmonary disease) ([place]) -home dose  inhalers  Chart review performed and case discussed with ED provider. Labs, imaging and/or ECG reviewed by provider and discussed with patient/family. Management plans discussed with the patient and/or family.  DVT PROPHYLAXIS: Mechanical only  GI PROPHYLAXIS:  PPI   ADMISSION STATUS: Inpatient     CODE STATUS: Full  TOTAL TIME TAKING CARE OF THIS PATIENT: 45 minutes.   Ethlyn Daniels 05/19/2018, 9:33 PM  [facility]  Office  [phone]  CC: Primary care physician; Alexa Body, MD  Note:  This document was prepared using Dragon voice recognition software and may include unintentional dictation errors.

## 2018-05-19 NOTE — ED Notes (Signed)
ED TO INPATIENT HANDOFF REPORT  ED Nurse Name and Phone #: Stanford Scotland [phone]  Name/Age/Gender Alexa Lowe 85 y.o. female Room/Bed: ED09A/ED09A  Code Status   Code Status: Not on file  Home/SNF/Other Home Patient oriented to: self, place, time and situation Is this baseline? Yes   Triage Complete: Triage complete   Chief Complaint Abnormal Labs  Triage Note Pt presents to ED via [facility] from home c/o low hemoglobin. Pt states she is not sure exactly what hgb level was but her PCP told her to come to ED immediately. C/o mid-sternal chest pain and SOB.    Allergies Allergies  Allergen Reactions  . Morphine And Related Other (See Comments)    Reaction:  Hallucinations   . Codeine Nausea And Vomiting    Level of Care/Admitting Diagnosis ED Disposition    ED Disposition Condition [facility]: [place] [[id number]]  Level of Care: Med-Surg [16]  Diagnosis: Symptomatic anemia [[id number]]  Admitting Physician: Lance Coon [[id number]]  Attending Physician: Lance Coon [phone]  Estimated length of stay: past midnight tomorrow  Certification:: I certify this patient will need inpatient services for at least 2 midnights  PT Class (Do Not Modify): Inpatient [101]  PT Acc Code (Do Not Modify): Private [1]       Medical/Surgery History Past Medical History:  Diagnosis Date  . Cancer [facility])    Right breast  . COPD (chronic obstructive pulmonary disease) ([place])   . Diabetes mellitus without complication ([place])   . Hypertension    Past Surgical History:  Procedure Laterality Date  . ABDOMINAL SURGERY    . APPENDECTOMY    . BACK SURGERY    . BREAST SURGERY    . CHOLECYSTECTOMY       IV Location/Drains/Wounds Patient Lines/Drains/Airways Status   Active Line/Drains/Airways    Name:   Placement date:   Placement time:   Site:   Days:   Peripheral IV 05/19/18 Left Forearm   05/19/18    1924    Forearm   less than 1   Peripheral IV 05/19/18 Right Antecubital   05/19/18    2123    Antecubital   less than 1          Intake/Output Last 24 hours  Intake/Output Summary (Last 24 hours) at 05/19/2018 2336 Last data filed at 05/19/2018 2143 Gross per 24 hour  Intake 360 ml  Output -  Net 360 ml    Labs/Imaging Results for orders placed or performed during the hospital encounter of 05/19/18 (from the past 48 hour(s))  Comprehensive metabolic panel     Status: Abnormal   Collection Time: 05/19/18  7:06 PM  Result Value Ref Range   Sodium 139 135 - 145 mmol/L   Potassium 3.5 3.5 - 5.1 mmol/L   Chloride 106 98 - 111 mmol/L   CO2 25 22 - 32 mmol/L   Glucose, Bld 111 (H) 70 - 99 mg/dL   BUN 13 8 - 23 mg/dL   Creatinine, Ser 1.07 (H) 0.44 - 1.00 mg/dL   Calcium 9.0 8.9 - 10.3 mg/dL   Total Protein 6.4 (L) 6.5 - 8.1 g/dL   Albumin 3.8 3.5 - 5.0 g/dL   AST 21 15 - 41 U/L   ALT 9 0 - 44 U/L   Alkaline Phosphatase 61 38 - 126 U/L   Total Bilirubin 1.2 0.3 - 1.2 mg/dL   GFR calc non Af Amer 47 (L) >60 mL/min  GFR calc Af Amer 55 (L) >60 mL/min   Anion gap 8 5 - 15    Comment: Performed at [facility], [address] [place], [place] [address]  CBC with Differential     Status: Abnormal   Collection Time: 05/19/18  7:06 PM  Result Value Ref Range   WBC 9.0 4.0 - 10.5 K/uL   RBC 3.40 (L) 3.87 - 5.11 MIL/uL   Hemoglobin 6.7 (L) 12.0 - 15.0 g/dL    Comment: Reticulocyte Hemoglobin testing may be clinically indicated, consider ordering this additional test [id number]    HCT 25.1 (L) 36.0 - 46.0 %   MCV 73.8 (L) 80.0 - 100.0 fL   MCH 19.7 (L) 26.0 - 34.0 pg   MCHC 26.7 (L) 30.0 - 36.0 g/dL   RDW 19.9 (H) 11.5 - 15.5 %   Platelets 265 150 - 400 K/uL   nRBC 0.0 0.0 - 0.2 %   Neutrophils Relative % 71 %   Neutro Abs 6.4 1.7 - 7.7 K/uL   Lymphocytes Relative 19 %   Lymphs Abs 1.7 0.7 - 4.0 K/uL   Monocytes Relative 9 %   Monocytes Absolute 0.8 0.1 - 1.0 K/uL   Eosinophils Relative 1 %    Eosinophils Absolute 0.1 0.0 - 0.5 K/uL   Basophils Relative 0 %   Basophils Absolute 0.0 0.0 - 0.1 K/uL   Immature Granulocytes 0 %   Abs Immature Granulocytes 0.04 0.00 - 0.07 K/uL    Comment: Performed at [facility], [address] [place], [place] [address]  Protime-INR     Status: None   Collection Time: 05/19/18  7:06 PM  Result Value Ref Range   Prothrombin Time 14.9 11.4 - 15.2 seconds   INR 1.18     Comment: Performed at [facility], [address] [place], [place] [address]  Troponin I - Once     Status: None   Collection Time: 05/19/18  7:13 PM  Result Value Ref Range   Troponin I <0.03 <0.03 ng/mL    Comment: Performed at [facility], [address] [place], [place] [address]  ABO/Rh     Status: None   Collection Time: 05/19/18  7:13 PM  Result Value Ref Range   ABO/RH(D)      O POS Performed at [facility], [address] [place], [place] [address]   Type and screen Ordered by PROVIDER DEFAULT     Status: None (Preliminary result)   Collection Time: 05/19/18  7:42 PM  Result Value Ref Range   ABO/RH(D) O POS    Antibody Screen NEG    Sample Expiration 05/22/2018    Unit Number [id number]    Blood Component Type RED CELLS,LR    Unit division 00    Status of Unit ISSUED    Transfusion Status OK TO TRANSFUSE    Crossmatch Result      Compatible Performed at [facility], [address] [place], [place] [address]    Unit Number [id number]    Blood Component Type RED CELLS,LR    Unit division 00    Status of Unit ALLOCATED    Transfusion Status OK TO TRANSFUSE    Crossmatch Result Compatible   Prepare RBC     Status: None   Collection Time: 05/19/18  7:49 PM  Result Value Ref Range   Order Confirmation      ORDER PROCESSED BY BLOOD BANK Performed at [facility], [address] [place], [place] [address]    No results found.  Pending Labs Unresulted Labs (From admission,  onward)    Start     Ordered   05/19/18 1954  Urinalysis, Complete w Microscopic  ONCE - STAT,   STAT     05/19/18 1953   Signed and Held  Basic metabolic panel  Tomorrow morning,   R     Signed and Held   Signed and Held  CBC  Tomorrow morning,   R     Signed and Held          Vitals/Pain Today's Vitals   05/19/18 2205 05/19/18 2230 05/19/18 2300 05/19/18 2325  BP: (!) 161/63 (!) 143/64 (!) 161/62   Pulse: 64 67 65   Resp: 18 15 16    Temp: 97.7 F (36.5 C)     TempSrc: Oral     SpO2:  94% 97%   Weight:      Height:      PainSc:    0-No pain    Isolation Precautions No active isolations  Medications Medications  pantoprazole (PROTONIX) 80 mg in sodium chloride 0.9 % 250 mL (0.32 mg/mL) infusion (8 mg/hr Intravenous New Bag/Given 05/19/18 2219)  0.9 %  sodium chloride infusion (Manually program via Guardrails IV Fluids) ( Intravenous New Bag/Given 05/19/18 2220)  pantoprazole (PROTONIX) 80 mg in sodium chloride 0.9 % 100 mL IVPB (0 mg Intravenous Stopped 05/19/18 2219)    Mobility walks with person assist Moderate fall risk   Focused Assessments RBC   Recommendations: See Admitting Provider Note  Report given to:   Additional Notes:

## 2018-05-19 NOTE — ED Notes (Signed)
Patient to restroom with one person assist with no difficulty. Patient returned to bed without incident.

## 2018-05-20 ENCOUNTER — Inpatient Hospital Stay: Payer: Medicare HMO | Admitting: Certified Registered Nurse Anesthetist

## 2018-05-20 ENCOUNTER — Encounter
Admission: EM | Disposition: A | Discharge status: Home / Self Care | Payer: Self-pay | Source: Home / Self Care | Attending: Internal Medicine

## 2018-05-20 HISTORY — PX: ESOPHAGOGASTRODUODENOSCOPY (EGD) WITH PROPOFOL: SHX5813

## 2018-05-20 LAB — GLUCOSE, CAPILLARY
GLUCOSE-CAPILLARY: 87 mg/dL (ref 70–99)
Glucose-Capillary: 95 mg/dL (ref 70–99)
Glucose-Capillary: 86 mg/dL (ref 70–99)
Glucose-Capillary: 96 mg/dL (ref 70–99)
Glucose-Capillary: 125 mg/dL — ABNORMAL HIGH (ref 70–99)

## 2018-05-20 LAB — CBC
HCT: 29.5 % — ABNORMAL LOW (ref 36.0–46.0)
Hemoglobin: 8.5 g/dL — ABNORMAL LOW (ref 12.0–15.0)
MCH: 22 pg — ABNORMAL LOW (ref 26.0–34.0)
MCHC: 28.8 g/dL — ABNORMAL LOW (ref 30.0–36.0)
MCV: 76.4 fL — ABNORMAL LOW (ref 80.0–100.0)
Platelets: 200 10*3/uL (ref 150–400)
RBC: 3.86 MIL/uL — ABNORMAL LOW (ref 3.87–5.11)
RDW: 19.6 % — ABNORMAL HIGH (ref 11.5–15.5)
WBC: 7.7 10*3/uL (ref 4.0–10.5)
nRBC: 0 % (ref 0.0–0.2)

## 2018-05-20 LAB — BASIC METABOLIC PANEL
ANION GAP: 9 (ref 5–15)
BUN: 15 mg/dL (ref 8–23)
CALCIUM: 8.4 mg/dL — AB (ref 8.9–10.3)
CO2: 25 mmol/L (ref 22–32)
Chloride: 107 mmol/L (ref 98–111)
Creatinine, Ser: 0.9 mg/dL (ref 0.44–1.00)
GFR calc non Af Amer: 58 mL/min — ABNORMAL LOW (ref >60)
Glucose, Bld: 87 mg/dL (ref 70–99)
Potassium: 3.5 mmol/L (ref 3.5–5.1)
Sodium: 141 mmol/L (ref 135–145)

## 2018-05-20 LAB — IRON AND TIBC
Iron: 107 ug/dL (ref 28–170)
SATURATION RATIOS: 32 % — AB (ref 10.4–31.8)
TIBC: 340 ug/dL (ref 250–450)
UIBC: 233 ug/dL

## 2018-05-20 LAB — RETICULOCYTES
IMMATURE RETIC FRACT: 31.4 % — AB (ref 2.3–15.9)
RBC.: 3.86 MIL/uL — ABNORMAL LOW (ref 3.87–5.11)
Retic Count, Absolute: 50.6 10*3/uL (ref 19.0–186.0)
Retic Ct Pct: 1.3 % (ref 0.4–3.1)

## 2018-05-20 LAB — FOLATE: Folate: 13.5 ng/mL (ref >5.9)

## 2018-05-20 LAB — FERRITIN: Ferritin: 6 ng/mL — ABNORMAL LOW (ref 11–307)

## 2018-05-20 SURGERY — ESOPHAGOGASTRODUODENOSCOPY (EGD) WITH PROPOFOL
Anesthesia: General

## 2018-05-20 MED ORDER — ALBUTEROL SULFATE (2.5 MG/3ML) 0.083% IN NEBU: 2.5000 mg | INHALATION_SOLUTION | RESPIRATORY_TRACT | Status: DC | PRN

## 2018-05-20 MED ORDER — ACETAMINOPHEN 325 MG PO TABS
650.0000 mg | ORAL_TABLET | Freq: Four times a day (QID) | ORAL | Status: DC | PRN
  Administered 2018-05-20 (×2): 650 mg via ORAL
  Filled 2018-05-20 (×2): qty 2

## 2018-05-20 MED ORDER — CITALOPRAM HYDROBROMIDE 20 MG PO TABS
30.0000 mg | ORAL_TABLET | Freq: Every day | ORAL | Status: DC
  Administered 2018-05-20 – 2018-05-21 (×2): 30 mg via ORAL
  Filled 2018-05-20 (×2): qty 2

## 2018-05-20 MED ORDER — ACETAMINOPHEN 650 MG RE SUPP: 650.0000 mg | Freq: Four times a day (QID) | RECTAL | Status: DC | PRN

## 2018-05-20 MED ORDER — LIDOCAINE HCL (CARDIAC) PF 100 MG/5ML IV SOSY
PREFILLED_SYRINGE | INTRAVENOUS | Status: DC | PRN
  Administered 2018-05-20: 80 mg via INTRAVENOUS

## 2018-05-20 MED ORDER — SODIUM CHLORIDE 0.9 % IV SOLN
INTRAVENOUS | Status: DC | PRN
  Administered 2018-05-20: 13:00:00 via INTRAVENOUS

## 2018-05-20 MED ORDER — LOSARTAN POTASSIUM 50 MG PO TABS
50.0000 mg | ORAL_TABLET | Freq: Every day | ORAL | Status: DC
  Administered 2018-05-20 – 2018-05-21 (×2): 50 mg via ORAL
  Filled 2018-05-20 (×2): qty 1

## 2018-05-20 MED ORDER — PROPOFOL 10 MG/ML IV BOLUS
INTRAVENOUS | Status: AC
  Filled 2018-05-20: qty 40

## 2018-05-20 MED ORDER — GABAPENTIN 100 MG PO CAPS
100.0000 mg | ORAL_CAPSULE | Freq: Every evening | ORAL | Status: DC | PRN
  Administered 2018-05-20: 100 mg via ORAL
  Filled 2018-05-20: qty 1

## 2018-05-20 MED ORDER — ONDANSETRON HCL 4 MG PO TABS: 4.0000 mg | ORAL_TABLET | Freq: Four times a day (QID) | ORAL | Status: DC | PRN

## 2018-05-20 MED ORDER — GABAPENTIN 300 MG PO CAPS
300.0000 mg | ORAL_CAPSULE | Freq: Three times a day (TID) | ORAL | Status: DC
  Administered 2018-05-20 – 2018-05-21 (×4): 300 mg via ORAL
  Filled 2018-05-20 (×4): qty 1

## 2018-05-20 MED ORDER — ONDANSETRON HCL 4 MG/2ML IJ SOLN
4.0000 mg | Freq: Four times a day (QID) | INTRAMUSCULAR | Status: DC | PRN
  Administered 2018-05-20: 10:00:00 4 mg via INTRAVENOUS
  Filled 2018-05-20: qty 2

## 2018-05-20 MED ORDER — INSULIN ASPART 100 UNIT/ML ~~LOC~~ SOLN
0.0000 [IU] | Freq: Four times a day (QID) | SUBCUTANEOUS | Status: DC
  Administered 2018-05-20: 18:00:00 1 [IU] via SUBCUTANEOUS
  Filled 2018-05-20: qty 1

## 2018-05-20 MED ORDER — LIDOCAINE HCL (PF) 2 % IJ SOLN
INTRAMUSCULAR | Status: AC
  Filled 2018-05-20: qty 20

## 2018-05-20 MED ORDER — PROPOFOL 10 MG/ML IV BOLUS
INTRAVENOUS | Status: DC | PRN
  Administered 2018-05-20: 10 mg via INTRAVENOUS
  Administered 2018-05-20: 50 mg via INTRAVENOUS

## 2018-05-20 NOTE — Consult Note (Signed)
[facility] GI Inpatient Consult Note   Kathline Magic, M.D.  Reason for Consult: Epigastric pain, melena   Attending Requesting Consult: Lance Coon, MD   History of Present Illness: Alexa Lowe is a 85 y.o. female with a history of chronic anemia, hypertension, diabetes, and COPD presents for symptoms of malaise, epigastric pain and "black stools".  Patient says she has had this intermittently for several months help to some degree with Carafate and pantoprazole taken at home.  She seems to take the medication haphazardly and possibly not consistently.  She takes 1 regular aspirin daily.  Patient was seen yesterday at [facility] clinic gastroenterology by Harless Litten, nurse practitioner who evaluated the patient with a CBC.  Patient had a very low hemoglobin around 6.7 and was directed to the emergency room straightaway. Currently the patient appears to be in no distress and says she is having mild epigastric discomfort, rubbing her abdomen in an attempt to soothe the pain.  There is been no further episodes of nausea but the patient does have a feeling of wanting to eat a meal.  She has been made n.p.o. for today and preparation for procedures.  Patient is unaware if she is previously had a colonoscopy, was scheduled for 1 about a year ago but canceled it.  She has been resistant up until recently to pursuing an EGD for epigastric pain.  She is now agreeable to go forward with evaluation.  Past Medical History:  Past Medical History:  Diagnosis Date  . Cancer [facility])    Right breast  . COPD (chronic obstructive pulmonary disease) ([place])   . Diabetes mellitus without complication ([place])   . Hypertension     Problem List: Patient Active Problem List   Diagnosis Date Noted  . Symptomatic anemia 05/19/2018  . HTN (hypertension) 05/19/2018  . Diabetes ([place]) 05/19/2018  . COPD (chronic obstructive pulmonary disease) ([place]) 05/19/2018    Past Surgical History: Past Surgical  History:  Procedure Laterality Date  . ABDOMINAL SURGERY    . APPENDECTOMY    . BACK SURGERY    . BREAST SURGERY    . CHOLECYSTECTOMY      Allergies: Allergies  Allergen Reactions  . Morphine And Related Other (See Comments)    Reaction:  Hallucinations   . Codeine Nausea And Vomiting    Home Medications: Medications Prior to Admission  Medication Sig Dispense Refill Last Dose  . albuterol (PROVENTIL HFA;VENTOLIN HFA) 108 (90 Base) MCG/ACT inhaler Inhale 2 puffs into the lungs every 4 (four) hours as needed for wheezing or shortness of breath.   Unknown at PRN  . allopurinol (ZYLOPRIM) 300 MG tablet Take 300 mg by mouth daily.   05/19/2018 at 0900  . aspirin EC 325 MG tablet Take 325 mg by mouth daily.   05/19/2018 at 0900  . atorvastatin (LIPITOR) 40 MG tablet Take 40 mg by mouth every evening.    05/18/2018 at 2100  . celecoxib (CELEBREX) 200 MG capsule Take 200 mg by mouth 2 (two) times daily.    05/19/2018 at 0900  . citalopram (CELEXA) 20 MG tablet Take 30 mg by mouth daily.    05/19/2018 at 0900  . clotrimazole-betamethasone (LOTRISONE) cream Apply 1 application topically 2 (two) times daily.   05/18/2018 at Unknown time  . gabapentin (NEURONTIN) 100 MG capsule Take 100 mg by mouth at bedtime as needed for tremors.   Past Week at PRN  . gabapentin (NEURONTIN) 300 MG capsule Take 300 mg by mouth  3 (three) times daily.    05/19/2018 at 0900  . losartan (COZAAR) 50 MG tablet Take 50 mg by mouth daily.   05/19/2018 at 0900  . metFORMIN (GLUCOPHAGE) 500 MG tablet Take 500 mg by mouth 2 (two) times daily.    05/19/2018 at 0900  . pantoprazole (PROTONIX) 40 MG tablet Take 40 mg by mouth 2 (two) times daily.    05/19/2018 at 0900  . alum & mag hydroxide-simeth (MAALOX REGULAR STRENGTH) 200-200-20 MG/5ML suspension Take 30 mLs by mouth every 4 (four) hours as needed for indigestion or heartburn. 355 mL 0   . HYDROcodone-acetaminophen (NORCO/VICODIN) 5-325 MG tablet Take 1 tablet every 6-8 hours prn  pain (Patient not taking: Reported on 07/18/2017) 12 tablet 0 Not Taking at Unknown time  . predniSONE (DELTASONE) 10 MG tablet Take 2 tablets once a day for 3 days starting Monday (Patient not taking: Reported on 07/18/2017) 6 tablet 0 Not Taking   Home medication reconciliation was completed with the patient.   Scheduled Inpatient Medications:   . citalopram  30 mg Oral Daily  . gabapentin  300 mg Oral TID  . insulin aspart  0-9 Units Subcutaneous Q6H  . losartan  50 mg Oral Daily    Continuous Inpatient Infusions:   . pantoprozole (PROTONIX) infusion 8 mg/hr (05/20/18 1008)    PRN Inpatient Medications:  acetaminophen **OR** acetaminophen, albuterol, gabapentin, ondansetron **OR** ondansetron (ZOFRAN) IV  Family History: family history is not on file.   GI Family History: NEgative  Social History:   reports that she has quit smoking. She has never used smokeless tobacco. She reports that she does not drink alcohol or use drugs. The patient denies ETOH, tobacco, or drug use.    Review of Systems: Review of Systems - General ROS: positive for  - fatigue negative for - night sweats or sleep disturbance Psychological ROS: negative Ophthalmic ROS: negative Allergy and Immunology ROS: negative Hematological and Lymphatic ROS: negative Endocrine ROS: negative Respiratory ROS: no cough, shortness of breath, or wheezing Cardiovascular ROS: no chest pain or dyspnea on exertion Genito-Urinary ROS: no dysuria, trouble voiding, or hematuria Musculoskeletal ROS: negative positive for - joint pain Neurological ROS: no TIA or stroke symptoms Dermatological ROS: negative  Physical Examination: BP (!) 135/106 (BP Location: Right Arm) Comment: Mary RN notified  Pulse 62   Temp 97.8 F (36.6 C) (Oral)   Resp 18   Ht 5\' 2"  (1.575 m)   Wt 95.7 kg   SpO2 95%   BMI 38.59 kg/m  Physical Exam Vitals signs and nursing note reviewed.  Constitutional:      General: She is not in acute  distress.    Appearance: Normal appearance. She is normal weight. She is not diaphoretic.  HENT:     Head: Normocephalic and atraumatic.     Left Ear: External ear normal.     Nose: Nose normal.     Mouth/Throat:     Mouth: Mucous membranes are dry.  Eyes:     Extraocular Movements: Extraocular movements intact.     Conjunctiva/sclera: Conjunctivae normal.     Pupils: Pupils are equal, round, and reactive to light.  Neck:     Musculoskeletal: No neck rigidity.  Cardiovascular:     Rate and Rhythm: Normal rate.     Heart sounds: No murmur. No gallop.   Pulmonary:     Effort: Pulmonary effort is normal.  Abdominal:     General: Abdomen is flat.     Palpations:  Abdomen is soft.     Tenderness: There is abdominal tenderness. There is no guarding or rebound.  Musculoskeletal:        General: No swelling or tenderness.  Skin:    General: Skin is warm and dry.  Neurological:     General: No focal deficit present.  Psychiatric:        Mood and Affect: Mood normal.     Data: Lab Results  Component Value Date   WBC 7.7 05/20/2018   HGB 8.5 (L) 05/20/2018   HCT 29.5 (L) 05/20/2018   MCV 76.4 (L) 05/20/2018   PLT 200 05/20/2018   Recent Labs  Lab 05/19/18 1906 05/20/18 0414  HGB 6.7* 8.5*   Lab Results  Component Value Date   NA 141 05/20/2018   K 3.5 05/20/2018   CL 107 05/20/2018   CO2 25 05/20/2018   BUN 15 05/20/2018   CREATININE 0.90 05/20/2018   Lab Results  Component Value Date   ALT 9 05/19/2018   AST 21 05/19/2018   ALKPHOS 61 05/19/2018   BILITOT 1.2 05/19/2018   Recent Labs  Lab 05/19/18 1906  INR 1.18   CBC Latest Ref Rng & Units 05/20/2018 05/19/2018 07/18/2017  WBC 4.0 - 10.5 K/uL 7.7 9.0 8.8  Hemoglobin 12.0 - 15.0 g/dL 8.5(L) 6.7(L) 9.4(L)  Hematocrit 36.0 - 46.0 % 29.5(L) 25.1(L) 30.2(L)  Platelets 150 - 400 K/uL 200 265 231    STUDIES: No results found. @IMAGES @  Assessment:  1.  Epigastric pain-differential diagnosis was peptic  ulcer disease, gastritis, mesenteric ischemia, pancreatitis.  White blood cell count is normal although patient is anemic.  Hemoglobin improved  to 8.5 after transfusion. 2.  Symptomatic anemia. 3.  Melena   Recommendations: 1.  Continue PPI therapy 2.  Serial hematocrit determinations. 3.  Serial examinations. 4.  Proceed EGD.The patient understands the nature of the planned procedure, indications, risks, alternatives and potential complications including but not limited to bleeding, infection, perforation, damage to internal organs and possible oversedation/side effects from anesthesia. The patient agrees and gives consent to proceed.  Please refer to procedure notes for findings, recommendations and patient disposition/instructions.  Thank you for the consult. Please call with questions or concerns.  Olean Ree, "Lanny Hurst MD [facility] [place], [place] [address] [phone]  05/20/2018 12:42 PM

## 2018-05-20 NOTE — Op Note (Addendum)
[facility] Gastroenterology Patient Name: [place] Procedure Date: 05/20/2018 12:19 PM MRN: [id number] Account #: [id number] Date of Birth: 07/23/1930 Admit Type: Inpatient Age: 85 Room: [facility] ENDO ROOM 3 Gender: Female Note Status: Finalized Procedure:            Upper GI endoscopy Indications:          Suspected upper gastrointestinal bleeding, Epigastric                        abdominal pain Providers:            Benay Pike. Sharley Keeler MD, MD Medicines:            Propofol per Anesthesia Complications:        No immediate complications. Procedure:            Pre-Anesthesia Assessment:                       - The risks and benefits of the procedure and the                        sedation options and risks were discussed with the                        patient. All questions were answered and informed                        consent was obtained.                       - Patient identification and proposed procedure were                        verified prior to the procedure by the nurse. The                        procedure was verified in the procedure room.                       - ASA Grade Assessment: III - A patient with severe                        systemic disease.                       - After reviewing the risks and benefits, the patient                        was deemed in satisfactory condition to undergo the                        procedure.                       After obtaining informed consent, the endoscope was                        passed under direct vision. Throughout the procedure,                        the patient's blood pressure, pulse, and oxygen  saturations were monitored continuously. The Endoscope                        was introduced through the mouth, and advanced to the                        third part of duodenum. The upper GI endoscopy was                        accomplished without difficulty. The patient  tolerated                        the procedure well. Findings:      The examined esophagus was normal.      One non-bleeding cratered gastric ulcer with no stigmata of bleeding was       found in the gastric antrum. The lesion was 16 mm in largest dimension.      The exam of the stomach was otherwise normal.      Biopsies were taken with a cold forceps on the anterior wall of the       gastric body and on the greater curvature of the gastric antrum for       Helicobacter pylori testing.      The cardia and gastric fundus were normal on retroflexion.      The examined duodenum was normal. Impression:           - Normal esophagus.                       - Non-bleeding gastric ulcer with no stigmata of                        bleeding.                       - Normal examined duodenum.                       - Biopsies were taken with a cold forceps for                        Helicobacter pylori testing. Recommendation:       - Return patient to hospital ward for ongoing care.                       - Advance diet as tolerated.                       - I anticipate no further need for intervention.                       - Use Protonix (pantoprazole) 40 mg PO daily daily.                       - Return to GI office in 3 weeks. Procedure Code(s):    --- Professional ---                       [phone], Esophagogastroduodenoscopy, flexible, transoral;                        with biopsy, single or  multiple Diagnosis Code(s):    --- Professional ---                       R10.13, Epigastric pain                       K25.9, Gastric ulcer, unspecified as acute or chronic,                        without hemorrhage or perforation CPT copyright 2018 American Medical Association. All rights reserved. The codes documented in this report are preliminary and upon coder review may  be revised to meet current compliance requirements. Efrain Sella MD, MD 05/20/2018 1:48:39 PM This report has been signed  electronically. Number of Addenda: 0 Note Initiated On: 05/20/2018 12:19 PM      [facility]

## 2018-05-20 NOTE — Anesthesia Postprocedure Evaluation (Signed)
Anesthesia Post Note  Patient: Alexa Lowe  Procedure(s) Performed: ESOPHAGOGASTRODUODENOSCOPY (EGD) WITH PROPOFOL (N/A )  Patient location during evaluation: Endoscopy Anesthesia Type: General Level of consciousness: awake and alert Pain management: pain level controlled Vital Signs Assessment: post-procedure vital signs reviewed and stable Respiratory status: spontaneous breathing, nonlabored ventilation, respiratory function stable and patient connected to nasal cannula oxygen Cardiovascular status: blood pressure returned to baseline and stable Postop Assessment: no apparent nausea or vomiting Anesthetic complications: no     Last Vitals:  Vitals:   05/20/18 1308 05/20/18 1348  BP: (!) 155/75 (!) 148/107  Pulse: (!) 103 (!) 118  Resp: 20 13  Temp: 36.7 C (!) 36.3 C  SpO2: 98% 95%    Last Pain:  Vitals:   05/20/18 1408  TempSrc:   PainSc: 0-No pain                 Martha Clan

## 2018-05-20 NOTE — Transfer of Care (Signed)
Immediate Anesthesia Transfer of Care Note  Patient: Alexa Lowe  Procedure(s) Performed: ESOPHAGOGASTRODUODENOSCOPY (EGD) WITH PROPOFOL (N/A )  Patient Location: PACU and Endoscopy Unit  Anesthesia Type:General  Level of Consciousness: awake, oriented, drowsy and patient cooperative  Airway & Oxygen Therapy: Patient Spontanous Breathing and Patient connected to nasal cannula oxygen  Post-op Assessment: Report given to RN, Post -op Vital signs reviewed and stable and Patient moving all extremities  Post vital signs: Reviewed and stable  Last Vitals:  Vitals Value Taken Time  BP 148/107 05/20/2018  1:49 PM  Temp    Pulse 107 05/20/2018  1:50 PM  Resp 15 05/20/2018  1:50 PM  SpO2 93 % 05/20/2018  1:50 PM  Vitals shown include unvalidated device data.  Last Pain:  Vitals:   05/20/18 1308  TempSrc: Tympanic  PainSc:       Patients Stated Pain Goal: 0 (88/91/69 4503)  Complications: No apparent anesthesia complications

## 2018-05-20 NOTE — Anesthesia Post-op Follow-up Note (Signed)
Anesthesia QCDR form completed.        

## 2018-05-20 NOTE — Anesthesia Preprocedure Evaluation (Signed)
Anesthesia Evaluation  Patient identified by MRN, date of birth, ID band Patient awake    Reviewed: Allergy & Precautions, H&P , NPO status , Patient's Chart, lab work & pertinent test results, reviewed documented beta blocker date and time   History of Anesthesia Complications Negative for: history of anesthetic complications  Airway Mallampati: II  TM Distance: >3 FB Neck ROM: full    Dental  (+) Edentulous Upper, Edentulous Lower, Upper Dentures, Lower Dentures, Dental Advidsory Given   Pulmonary neg shortness of breath, neg sleep apnea, COPD, neg recent URI, former smoker,    Pulmonary exam normal        Cardiovascular Exercise Tolerance: Good hypertension, (-) angina(-) CAD, (-) Past MI, (-) Cardiac Stents and (-) CABG (-) dysrhythmias (-) Valvular Problems/Murmurs     Neuro/Psych negative neurological ROS  negative psych ROS   GI/Hepatic Neg liver ROS, GERD  ,  Endo/Other  diabetes  Renal/GU negative Renal ROS  negative genitourinary   Musculoskeletal   Abdominal   Peds  Hematology  (+) Blood dyscrasia, anemia ,   Anesthesia Other Findings Past Medical History: No date: Cancer ([place])     Comment:  Right breast No date: COPD (chronic obstructive pulmonary disease) ([facility]) No date: Diabetes mellitus without complication ([facility]) No date: Hypertension   Reproductive/Obstetrics negative OB ROS                             Anesthesia Physical Anesthesia Plan  ASA: III  Anesthesia Plan: General   Post-op Pain Management:    Induction: Intravenous  PONV Risk Score and Plan: 3 and Propofol infusion and TIVA  Airway Management Planned: Natural Airway and Nasal Cannula  Additional Equipment:   Intra-op Plan:   Post-operative Plan:   Informed Consent: I have reviewed the patients History and Physical, chart, labs and discussed the procedure including the risks, benefits and  alternatives for the proposed anesthesia with the patient or authorized representative who has indicated his/her understanding and acceptance.     Dental Advisory Given  Plan Discussed with: Anesthesiologist, CRNA and Surgeon  Anesthesia Plan Comments:         Anesthesia Quick Evaluation

## 2018-05-21 ENCOUNTER — Encounter: Payer: Self-pay | Admitting: Internal Medicine

## 2018-05-21 LAB — GLUCOSE, CAPILLARY
Glucose-Capillary: 100 mg/dL — ABNORMAL HIGH (ref 70–99)
Glucose-Capillary: 111 mg/dL — ABNORMAL HIGH (ref 70–99)
Glucose-Capillary: 117 mg/dL — ABNORMAL HIGH (ref 70–99)
Glucose-Capillary: 96 mg/dL (ref 70–99)

## 2018-05-21 LAB — TYPE AND SCREEN
ABO/RH(D): O POS
ANTIBODY SCREEN: NEGATIVE
Unit division: 0
Unit division: 0

## 2018-05-21 LAB — BPAM RBC
BLOOD PRODUCT EXPIRATION DATE: 202002132359
Blood Product Expiration Date: 202002132359
ISSUE DATE / TIME: 202002112123
ISSUE DATE / TIME: 202002120053
Unit Type and Rh: 9500
Unit Type and Rh: 9500

## 2018-05-21 LAB — CBC WITH DIFFERENTIAL/PLATELET
Abs Immature Granulocytes: 0.02 10*3/uL (ref 0.00–0.07)
Basophils Absolute: 0 10*3/uL (ref 0.0–0.1)
Basophils Relative: 1 %
Eosinophils Absolute: 0.2 10*3/uL (ref 0.0–0.5)
Eosinophils Relative: 4 %
HCT: 31.1 % — ABNORMAL LOW (ref 36.0–46.0)
Hemoglobin: 8.8 g/dL — ABNORMAL LOW (ref 12.0–15.0)
Immature Granulocytes: 0 %
LYMPHS ABS: 1.4 10*3/uL (ref 0.7–4.0)
Lymphocytes Relative: 24 %
MCH: 22.1 pg — ABNORMAL LOW (ref 26.0–34.0)
MCHC: 28.3 g/dL — ABNORMAL LOW (ref 30.0–36.0)
MCV: 77.9 fL — AB (ref 80.0–100.0)
MONOS PCT: 13 %
Monocytes Absolute: 0.8 10*3/uL (ref 0.1–1.0)
Neutro Abs: 3.5 10*3/uL (ref 1.7–7.7)
Neutrophils Relative %: 58 %
Platelets: 226 10*3/uL (ref 150–400)
RBC: 3.99 MIL/uL (ref 3.87–5.11)
RDW: 20.3 % — ABNORMAL HIGH (ref 11.5–15.5)
WBC: 5.9 10*3/uL (ref 4.0–10.5)
nRBC: 0 % (ref 0.0–0.2)

## 2018-05-21 LAB — SURGICAL PATHOLOGY

## 2018-05-21 MED ORDER — SUCRALFATE 1 GM/10ML PO SUSP: 1.0000 g | Freq: Three times a day (TID) | ORAL | 0 refills | Status: DC

## 2018-05-21 MED ORDER — SUCRALFATE 1 GM/10ML PO SUSP
1.0000 g | Freq: Three times a day (TID) | ORAL | Status: DC
  Administered 2018-05-21: 1 g via ORAL
  Filled 2018-05-21: qty 10

## 2018-05-21 MED ORDER — PANTOPRAZOLE SODIUM 40 MG PO TBEC
40.0000 mg | DELAYED_RELEASE_TABLET | Freq: Two times a day (BID) | ORAL | Status: DC
  Administered 2018-05-21: 12:00:00 40 mg via ORAL
  Filled 2018-05-21: qty 1

## 2018-05-21 NOTE — Plan of Care (Signed)

## 2018-05-21 NOTE — Progress Notes (Signed)
[place] at [facility]                                                                                                                                                                                  Patient Demographics   Alexa Lowe, is a 85 y.o. female, DOB - [birth year], SWN:[id number]  Admit date - 05/19/2018   Admitting Physician Lance Coon, MD  Outpatient Primary MD for the patient is Dion Body, MD   LOS - 2  Subjective:  Patient was admitted with symptomatic anemia with dark tarry stools She states that she is very hungry   Review of Systems:   CONSTITUTIONAL: No documented fever. No fatigue, weakness. No weight gain, no weight loss.  EYES: No blurry or double vision.  ENT: No tinnitus. No postnasal drip. No redness of the oropharynx.  RESPIRATORY: No cough, no wheeze, no hemoptysis. No dyspnea.  CARDIOVASCULAR: No chest pain. No orthopnea. No palpitations. No syncope.  GASTROINTESTINAL: No nausea, no vomiting or diarrhea.  Positive abdominal pain.  Positive melena GENITOURINARY: No dysuria or hematuria.  ENDOCRINE: No polyuria or nocturia. No heat or cold intolerance.  HEMATOLOGY: No anemia. No bruising. No bleeding.  INTEGUMENTARY: No rashes. No lesions.  MUSCULOSKELETAL: No arthritis. No swelling. No gout.  NEUROLOGIC: No numbness, tingling, or ataxia. No seizure-type activity.  PSYCHIATRIC: No anxiety. No insomnia. No ADD.    Vitals:   Vitals:   05/20/18 1703 05/20/18 1914 05/21/18 0359 05/21/18 0812  BP: (!) 121/56 (!) 150/55 133/77 (!) 130/48  Pulse: 70 68 (!) 58 61  Resp: 18 16 17 16   Temp: 97.9 F (36.6 C) 97.8 F (36.6 C) 97.7 F (36.5 C) 97.8 F (36.6 C)  TempSrc: Oral Oral Oral Oral  SpO2: 90% 94% 92% 90%  Weight:      Height:        Wt Readings from Last 3 Encounters:  05/20/18 95.7 kg  07/18/17 99.8 kg  02/09/17 99.8 kg     Intake/Output Summary (Last 24 hours) at 05/21/2018 0857 Last data filed  at 05/21/2018 0350 Gross per 24 hour  Intake 827.22 ml  Output 0 ml  Net 827.22 ml    Physical Exam:   GENERAL: Pleasant-appearing in no apparent distress.  HEAD, EYES, EARS, NOSE AND THROAT: Atraumatic, normocephalic. Extraocular muscles are intact. Pupils equal and reactive to light. Sclerae anicteric. No conjunctival injection. No oro-pharyngeal erythema.  NECK: Supple. There is no jugular venous distention. No bruits, no lymphadenopathy, no thyromegaly.  HEART: Regular rate and rhythm,. No murmurs, no rubs, no clicks.  LUNGS: Clear to auscultation bilaterally. No rales or rhonchi. No wheezes.  ABDOMEN: Soft, flat, epigastric  tenderness nondistended. Has good bowel sounds. No hepatosplenomegaly appreciated.  EXTREMITIES: No evidence of any cyanosis, clubbing, or peripheral edema.  +2 pedal and radial pulses bilaterally.  NEUROLOGIC: The patient is alert, awake, and oriented x3 with no focal motor or sensory deficits appreciated bilaterally.  SKIN: Moist and warm with no rashes appreciated.  Psych: Not anxious, depressed LN: No inguinal LN enlargement    Antibiotics   Anti-infectives (From admission, onward)   None      Medications   Scheduled Meds: . citalopram  30 mg Oral Daily  . gabapentin  300 mg Oral TID  . insulin aspart  0-9 Units Subcutaneous Q6H  . losartan  50 mg Oral Daily   Continuous Infusions: . pantoprozole (PROTONIX) infusion 8 mg/hr (05/21/18 0546)   PRN Meds:.acetaminophen **OR** acetaminophen, albuterol, gabapentin, ondansetron **OR** ondansetron (ZOFRAN) IV   Data Review:   Micro Results No results found for this or any previous visit (from the past 240 hour(s)).  Radiology Reports No results found.   CBC Recent Labs  Lab 05/19/18 1906 05/20/18 0414  WBC 9.0 7.7  HGB 6.7* 8.5*  HCT 25.1* 29.5*  PLT 265 200  MCV 73.8* 76.4*  MCH 19.7* 22.0*  MCHC 26.7* 28.8*  RDW 19.9* 19.6*  LYMPHSABS 1.7  --   MONOABS 0.8  --   EOSABS 0.1  --    BASOSABS 0.0  --     Chemistries  Recent Labs  Lab 05/19/18 1906 05/20/18 0414  NA 139 141  K 3.5 3.5  CL 106 107  CO2 25 25  GLUCOSE 111* 87  BUN 13 15  CREATININE 1.07* 0.90  CALCIUM 9.0 8.4*  AST 21  --   ALT 9  --   ALKPHOS 61  --   BILITOT 1.2  --    ------------------------------------------------------------------------------------------------------------------ estimated creatinine clearance is 49.3 mL/min (by C-G formula based on SCr of 0.9 mg/dL). ------------------------------------------------------------------------------------------------------------------ No results for input(s): HGBA1C in the last 72 hours. ------------------------------------------------------------------------------------------------------------------ No results for input(s): CHOL, HDL, LDLCALC, TRIG, CHOLHDL, LDLDIRECT in the last 72 hours. ------------------------------------------------------------------------------------------------------------------ No results for input(s): TSH, T4TOTAL, T3FREE, THYROIDAB in the last 72 hours.  Invalid input(s): FREET3 ------------------------------------------------------------------------------------------------------------------ Recent Labs    05/20/18 0414  FOLATE 13.5  FERRITIN 6*  TIBC 340  IRON 107  RETICCTPCT 1.3    Coagulation profile Recent Labs  Lab 05/19/18 1906  INR 1.18    No results for input(s): DDIMER in the last 72 hours.  Cardiac Enzymes Recent Labs  Lab 05/19/18 1913  TROPONINI <0.03   ------------------------------------------------------------------------------------------------------------------ Invalid input(s): POCBNP    Assessment & Plan  Patient is 85 year old presented with abdominal pain  1. Symptomatic anemia - status post transfusion 2 units hemoglobin stable this morning I will check anemia panel We will do further work-up for her anemia to see if she needs any iron 2.  Abdominal pain with  melena GI has been consulted EGD planned later 3.  HTN (hypertension) -continue home meds 4  Diabetes (HCC) -sliding scale insulin coverage 5 COPD (chronic obstructive pulmonary disease     Code Status Orders  (From admission, onward)         Start     Ordered   05/20/18 1220  Do not attempt resuscitation (DNR)  Continuous    Question Answer Comment  In the event of cardiac or respiratory ARREST Do not call a "code blue"   In the event of cardiac or respiratory ARREST Do not perform Intubation, CPR, defibrillation or ACLS  In the event of cardiac or respiratory ARREST Use medication by any route, position, wound care, and other measures to relive pain and suffering. May use oxygen, suction and manual treatment of airway obstruction as needed for comfort.      05/20/18 1219        Code Status History    Date Active Date Inactive Code Status Order ID Comments User Context   05/20/2018 0006 05/20/2018 1219 Full Code [id number]  Lance Coon, MD Inpatient    Advance Directive Documentation     Most Recent Value  Type of Advance Directive  Healthcare Power of [place], Living will  Pre-existing out of facility DNR order (yellow form or pink MOST form)  -  "MOST" Form in Place?  -           Consults  gi   DVT Prophylaxis  scd's  Lab Results  Component Value Date   PLT 200 05/20/2018     Time Spent in minutes  77min Greater than 50% of time spent in care coordination and counseling patient regarding the condition and plan of care.   Dustin Flock M.D on 05/21/2018 at 8:57 AM  Between 7am to 6pm - Pager - [phone]  After 6pm go to [email or web address] - Proofreader  [employer] [facility]   Office  [phone]

## 2018-05-21 NOTE — Discharge Summary (Signed)
[facility] - [place] at [facility], 85 y.o., DOB 01-28-1931, MRN [id number]. Admission date: 05/19/2018 Discharge Date 05/21/2018 Primary MD Dion Body, MD Admitting Physician Lance Coon, MD  Admission Diagnosis  Epigastric pain [R10.13] Anemia, unspecified type [D64.9]  Discharge Diagnosis   Principal Problem: Upper GI bleed due to gastric ulcer Symptomatic anemia   HTN (hypertension)   Diabetes ([place])   COPD (chronic obstructive pulmonary disease) [facility])         Alexa Lowe  is a 85 y.o. female who presents with chief complaint as above.  Patient presents to the ED for evaluation after being found to have hemoglobin of 6.7 during routine lab work at outpatient GI clinic today.  Patient was having progressive weakness with activity.  She was noted to have hemoglobin that was low.  Therefore she was referred for admission.  Patient received 2 units of packed RBCs hemoglobin was stable.  Was seen by GI and underwent EGD which showed a large gastric ulcer without acting bleeding.  Patient will be continued on Protonix 40 twice daily.  As well as started on Carafate.     Recommend a CBC check next week next her hemoglobin stable      Consults  GI  Significant Tests:  See full reports for all details     No results found.     Today   Subjective:   Alexa Lowe patient doing well denies any complaints Objective:   Blood pressure (!) 130/48, pulse 61, temperature 97.8 F (36.6 C), temperature source Oral, resp. rate 16, height 5\' 2"  (1.575 m), weight 95.7 kg, SpO2 90 %.  .  Intake/Output Summary (Last 24 hours) at 05/21/2018 1254 Last data filed at 05/21/2018 0350 Gross per 24 hour  Intake 827.22 ml  Output 0 ml  Net 827.22 ml    Exam VITAL SIGNS: Blood pressure (!) 130/48, pulse 61, temperature 97.8 F (36.6 C), temperature source Oral, resp. rate 16, height 5\' 2"  (1.575 m), weight 95.7 kg, SpO2 90  %.  GENERAL:  85 y.o.-year-old patient lying in the bed with no acute distress.  EYES: Pupils equal, round, reactive to light and accommodation. No scleral icterus. Extraocular muscles intact.  HEENT: Head atraumatic, normocephalic. Oropharynx and nasopharynx clear.  NECK:  Supple, no jugular venous distention. No thyroid enlargement, no tenderness.  LUNGS: Normal breath sounds bilaterally, no wheezing, rales,rhonchi or crepitation. No use of accessory muscles of respiration.  CARDIOVASCULAR: S1, S2 normal. No murmurs, rubs, or gallops.  ABDOMEN: Soft, nontender, nondistended. Bowel sounds present. No organomegaly or mass.  EXTREMITIES: No pedal edema, cyanosis, or clubbing.  NEUROLOGIC: Cranial nerves II through XII are intact. Muscle strength 5/5 in all extremities. Sensation intact. Gait not checked.  PSYCHIATRIC: The patient is alert and oriented x 3.  SKIN: No obvious rash, lesion, or ulcer.   Data Review     CBC w Diff:  Lab Results  Component Value Date   WBC 5.9 05/21/2018   HGB 8.8 (L) 05/21/2018   HGB 11.9 (L) 04/02/2014   HCT 31.1 (L) 05/21/2018   HCT 37.3 04/02/2014   PLT 226 05/21/2018   PLT 233 04/02/2014   LYMPHOPCT 24 05/21/2018   LYMPHOPCT 26.4 12/08/2012   MONOPCT 13 05/21/2018   MONOPCT 6.9 12/08/2012   EOSPCT 4 05/21/2018   EOSPCT 1.8 12/08/2012   BASOPCT 1 05/21/2018   BASOPCT 0.7 12/08/2012   CMP:  Lab Results  Component Value Date  NA 141 05/20/2018   NA 137 04/02/2014   K 3.5 05/20/2018   K 3.8 04/02/2014   CL 107 05/20/2018   CL 102 04/02/2014   CO2 25 05/20/2018   CO2 30 04/02/2014   BUN 15 05/20/2018   BUN 13 04/02/2014   CREATININE 0.90 05/20/2018   CREATININE 1.09 04/02/2014   PROT 6.4 (L) 05/19/2018   PROT 6.7 01/11/2013   ALBUMIN 3.8 05/19/2018   ALBUMIN 3.5 01/11/2013   BILITOT 1.2 05/19/2018   BILITOT 0.7 01/11/2013   ALKPHOS 61 05/19/2018   ALKPHOS 106 01/11/2013   AST 21 05/19/2018   AST 28 01/11/2013   ALT 9 05/19/2018    ALT 18 01/11/2013  .  Micro Results No results found for this or any previous visit (from the past 240 hour(s)).      Code Status Orders  (From admission, onward)         Start     Ordered   05/20/18 1220  Do not attempt resuscitation (DNR)  Continuous    Question Answer Comment  In the event of cardiac or respiratory ARREST Do not call a "code blue"   In the event of cardiac or respiratory ARREST Do not perform Intubation, CPR, defibrillation or ACLS   In the event of cardiac or respiratory ARREST Use medication by any route, position, wound care, and other measures to relive pain and suffering. May use oxygen, suction and manual treatment of airway obstruction as needed for comfort.      05/20/18 1219        Code Status History    Date Active Date Inactive Code Status Order ID Comments User Context   05/20/2018 0006 05/20/2018 1219 Full Code [id number]  Lance Coon, MD Inpatient    Advance Directive Documentation     Most Recent Value  Type of Advance Directive  Healthcare Power of Attorney, Living will  Pre-existing out of facility DNR order (yellow form or pink MOST form)  -  "MOST" Form in Place?  -          Follow-up Information    Dion Body, MD. Go on 05/25/2018.   Specialty:  Family Medicine Why:  @1 :15 PM Contact information: [place] [address] [phone]        Minus Liberty, PA-C. Go on 06/04/2018.   Specialty:  Gastroenterology Why:  @11 :00 AM   with Dr. Alice Reichert assistant  Contact information: [place] [address] [phone]           Discharge Medications   Allergies as of 05/21/2018      Reactions   Morphine And Related Other (See Comments)   Reaction:  Hallucinations    Codeine Nausea And Vomiting      Medication List    STOP taking these medications   clotrimazole-betamethasone cream Commonly known as:  LOTRISONE   HYDROcodone-acetaminophen  5-325 MG tablet Commonly known as:  NORCO/VICODIN   predniSONE 10 MG tablet Commonly known as:  DELTASONE     TAKE these medications   albuterol 108 (90 Base) MCG/ACT inhaler Commonly known as:  PROVENTIL HFA;VENTOLIN HFA Inhale 2 puffs into the lungs every 4 (four) hours as needed for wheezing or shortness of breath.   allopurinol 300 MG tablet Commonly known as:  ZYLOPRIM Take 300 mg by mouth daily.   alum & mag hydroxide-simeth 200-200-20 MG/5ML suspension Commonly known as:  MAALOX REGULAR STRENGTH Take 30 mLs by mouth every 4 (  four) hours as needed for indigestion or heartburn.   aspirin EC 325 MG tablet Take 325 mg by mouth daily.   atorvastatin 40 MG tablet Commonly known as:  LIPITOR Take 40 mg by mouth every evening.   celecoxib 200 MG capsule Commonly known as:  CELEBREX Take 200 mg by mouth 2 (two) times daily.   citalopram 20 MG tablet Commonly known as:  CELEXA Take 30 mg by mouth daily.   gabapentin 300 MG capsule Commonly known as:  NEURONTIN Take 300 mg by mouth 3 (three) times daily.   gabapentin 100 MG capsule Commonly known as:  NEURONTIN Take 100 mg by mouth at bedtime as needed for tremors.   losartan 50 MG tablet Commonly known as:  COZAAR Take 50 mg by mouth daily.   metFORMIN 500 MG tablet Commonly known as:  GLUCOPHAGE Take 500 mg by mouth 2 (two) times daily.   pantoprazole 40 MG tablet Commonly known as:  PROTONIX Take 40 mg by mouth 2 (two) times daily.   sucralfate 1 GM/10ML suspension Commonly known as:  CARAFATE Take 10 mLs (1 g total) by mouth 4 (four) times daily -  with meals and at bedtime for 30 days.          Total Time in preparing paper work, data evaluation and todays exam - 51 minutes  Dustin Flock M.D on 05/21/2018 at 12:54 PM [place]  [phone]

## 2018-05-21 NOTE — Progress Notes (Addendum)
Advanced care plan.  Purpose of the Encounter: CODE STATUS  Parties in Attendance: Patient herself  Patient's Decision Capacity: Intact  Subjective/Patient's story:  Patient is 85 year old with hypertension, diabetes admitted with abdominal pain and a GI bleed  Objective/Medical story I discussed with the patient regarding her desires for cardiac and pulmonary resuscitation   Goals of care determination:   Patient states that she would like to DNR  CODE STATUS: dnr   Time spent discussing advanced care planning: 16 minutes

## 2018-05-22 DIAGNOSIS — M545 Low back pain: Secondary | ICD-10-CM | POA: Diagnosis not present

## 2018-05-22 DIAGNOSIS — M150 Primary generalized (osteo)arthritis: Secondary | ICD-10-CM | POA: Diagnosis not present

## 2018-05-25 DIAGNOSIS — K250 Acute gastric ulcer with hemorrhage: Secondary | ICD-10-CM | POA: Diagnosis not present

## 2018-05-25 DIAGNOSIS — G2581 Restless legs syndrome: Secondary | ICD-10-CM | POA: Diagnosis not present

## 2018-05-25 DIAGNOSIS — Z9889 Other specified postprocedural states: Secondary | ICD-10-CM | POA: Diagnosis not present

## 2018-05-25 DIAGNOSIS — E785 Hyperlipidemia, unspecified: Secondary | ICD-10-CM | POA: Diagnosis not present

## 2018-05-25 DIAGNOSIS — J449 Chronic obstructive pulmonary disease, unspecified: Secondary | ICD-10-CM | POA: Diagnosis not present

## 2018-05-25 DIAGNOSIS — E1169 Type 2 diabetes mellitus with other specified complication: Secondary | ICD-10-CM | POA: Diagnosis not present

## 2018-05-25 DIAGNOSIS — Z8639 Personal history of other endocrine, nutritional and metabolic disease: Secondary | ICD-10-CM | POA: Diagnosis not present

## 2018-05-25 DIAGNOSIS — G629 Polyneuropathy, unspecified: Secondary | ICD-10-CM | POA: Diagnosis not present

## 2018-05-25 DIAGNOSIS — I10 Essential (primary) hypertension: Secondary | ICD-10-CM | POA: Diagnosis not present

## 2018-05-25 DIAGNOSIS — G4700 Insomnia, unspecified: Secondary | ICD-10-CM | POA: Diagnosis not present

## 2018-05-25 DIAGNOSIS — N183 Chronic kidney disease, stage 3 (moderate): Secondary | ICD-10-CM | POA: Diagnosis not present

## 2018-05-25 DIAGNOSIS — M5481 Occipital neuralgia: Secondary | ICD-10-CM | POA: Diagnosis not present

## 2018-05-25 DIAGNOSIS — G250 Essential tremor: Secondary | ICD-10-CM | POA: Diagnosis not present

## 2018-06-01 DIAGNOSIS — G8929 Other chronic pain: Secondary | ICD-10-CM | POA: Diagnosis not present

## 2018-06-01 DIAGNOSIS — M1990 Unspecified osteoarthritis, unspecified site: Secondary | ICD-10-CM | POA: Diagnosis not present

## 2018-06-01 DIAGNOSIS — J449 Chronic obstructive pulmonary disease, unspecified: Secondary | ICD-10-CM | POA: Diagnosis not present

## 2018-06-01 DIAGNOSIS — M5481 Occipital neuralgia: Secondary | ICD-10-CM | POA: Diagnosis not present

## 2018-06-01 DIAGNOSIS — I10 Essential (primary) hypertension: Secondary | ICD-10-CM | POA: Diagnosis not present

## 2018-06-01 DIAGNOSIS — F329 Major depressive disorder, single episode, unspecified: Secondary | ICD-10-CM | POA: Diagnosis not present

## 2018-06-01 DIAGNOSIS — E1142 Type 2 diabetes mellitus with diabetic polyneuropathy: Secondary | ICD-10-CM | POA: Diagnosis not present

## 2018-06-01 DIAGNOSIS — M545 Low back pain: Secondary | ICD-10-CM | POA: Diagnosis not present

## 2018-06-01 DIAGNOSIS — G43909 Migraine, unspecified, not intractable, without status migrainosus: Secondary | ICD-10-CM | POA: Diagnosis not present

## 2018-06-04 DIAGNOSIS — D509 Iron deficiency anemia, unspecified: Secondary | ICD-10-CM | POA: Diagnosis not present

## 2018-06-04 DIAGNOSIS — Z6841 Body Mass Index (BMI) 40.0 and over, adult: Secondary | ICD-10-CM | POA: Diagnosis not present

## 2018-06-05 DIAGNOSIS — I10 Essential (primary) hypertension: Secondary | ICD-10-CM | POA: Diagnosis not present

## 2018-06-05 DIAGNOSIS — F329 Major depressive disorder, single episode, unspecified: Secondary | ICD-10-CM | POA: Diagnosis not present

## 2018-06-05 DIAGNOSIS — G8929 Other chronic pain: Secondary | ICD-10-CM | POA: Diagnosis not present

## 2018-06-05 DIAGNOSIS — M545 Low back pain: Secondary | ICD-10-CM | POA: Diagnosis not present

## 2018-06-05 DIAGNOSIS — M1990 Unspecified osteoarthritis, unspecified site: Secondary | ICD-10-CM | POA: Diagnosis not present

## 2018-06-05 DIAGNOSIS — J449 Chronic obstructive pulmonary disease, unspecified: Secondary | ICD-10-CM | POA: Diagnosis not present

## 2018-06-05 DIAGNOSIS — M5481 Occipital neuralgia: Secondary | ICD-10-CM | POA: Diagnosis not present

## 2018-06-05 DIAGNOSIS — E1142 Type 2 diabetes mellitus with diabetic polyneuropathy: Secondary | ICD-10-CM | POA: Diagnosis not present

## 2018-06-05 DIAGNOSIS — G43909 Migraine, unspecified, not intractable, without status migrainosus: Secondary | ICD-10-CM | POA: Diagnosis not present

## 2018-06-08 DIAGNOSIS — G8929 Other chronic pain: Secondary | ICD-10-CM | POA: Diagnosis not present

## 2018-06-08 DIAGNOSIS — E1142 Type 2 diabetes mellitus with diabetic polyneuropathy: Secondary | ICD-10-CM | POA: Diagnosis not present

## 2018-06-08 DIAGNOSIS — I10 Essential (primary) hypertension: Secondary | ICD-10-CM | POA: Diagnosis not present

## 2018-06-08 DIAGNOSIS — F329 Major depressive disorder, single episode, unspecified: Secondary | ICD-10-CM | POA: Diagnosis not present

## 2018-06-08 DIAGNOSIS — M1990 Unspecified osteoarthritis, unspecified site: Secondary | ICD-10-CM | POA: Diagnosis not present

## 2018-06-08 DIAGNOSIS — J449 Chronic obstructive pulmonary disease, unspecified: Secondary | ICD-10-CM | POA: Diagnosis not present

## 2018-06-08 DIAGNOSIS — G43909 Migraine, unspecified, not intractable, without status migrainosus: Secondary | ICD-10-CM | POA: Diagnosis not present

## 2018-06-08 DIAGNOSIS — M545 Low back pain: Secondary | ICD-10-CM | POA: Diagnosis not present

## 2018-06-08 DIAGNOSIS — M5481 Occipital neuralgia: Secondary | ICD-10-CM | POA: Diagnosis not present

## 2018-06-10 DIAGNOSIS — J449 Chronic obstructive pulmonary disease, unspecified: Secondary | ICD-10-CM | POA: Diagnosis not present

## 2018-06-10 DIAGNOSIS — F329 Major depressive disorder, single episode, unspecified: Secondary | ICD-10-CM | POA: Diagnosis not present

## 2018-06-10 DIAGNOSIS — G43909 Migraine, unspecified, not intractable, without status migrainosus: Secondary | ICD-10-CM | POA: Diagnosis not present

## 2018-06-10 DIAGNOSIS — I10 Essential (primary) hypertension: Secondary | ICD-10-CM | POA: Diagnosis not present

## 2018-06-10 DIAGNOSIS — M5481 Occipital neuralgia: Secondary | ICD-10-CM | POA: Diagnosis not present

## 2018-06-10 DIAGNOSIS — M1990 Unspecified osteoarthritis, unspecified site: Secondary | ICD-10-CM | POA: Diagnosis not present

## 2018-06-10 DIAGNOSIS — G8929 Other chronic pain: Secondary | ICD-10-CM | POA: Diagnosis not present

## 2018-06-10 DIAGNOSIS — E1142 Type 2 diabetes mellitus with diabetic polyneuropathy: Secondary | ICD-10-CM | POA: Diagnosis not present

## 2018-06-10 DIAGNOSIS — M545 Low back pain: Secondary | ICD-10-CM | POA: Diagnosis not present

## 2018-06-11 DIAGNOSIS — M5481 Occipital neuralgia: Secondary | ICD-10-CM | POA: Diagnosis not present

## 2018-06-11 DIAGNOSIS — M1990 Unspecified osteoarthritis, unspecified site: Secondary | ICD-10-CM | POA: Diagnosis not present

## 2018-06-11 DIAGNOSIS — E1142 Type 2 diabetes mellitus with diabetic polyneuropathy: Secondary | ICD-10-CM | POA: Diagnosis not present

## 2018-06-11 DIAGNOSIS — M545 Low back pain: Secondary | ICD-10-CM | POA: Diagnosis not present

## 2018-06-11 DIAGNOSIS — G43909 Migraine, unspecified, not intractable, without status migrainosus: Secondary | ICD-10-CM | POA: Diagnosis not present

## 2018-06-11 DIAGNOSIS — I10 Essential (primary) hypertension: Secondary | ICD-10-CM | POA: Diagnosis not present

## 2018-06-11 DIAGNOSIS — F329 Major depressive disorder, single episode, unspecified: Secondary | ICD-10-CM | POA: Diagnosis not present

## 2018-06-11 DIAGNOSIS — J449 Chronic obstructive pulmonary disease, unspecified: Secondary | ICD-10-CM | POA: Diagnosis not present

## 2018-06-11 DIAGNOSIS — G8929 Other chronic pain: Secondary | ICD-10-CM | POA: Diagnosis not present

## 2018-06-12 ENCOUNTER — Ambulatory Visit: Admit: 2018-06-12 | Payer: Medicare HMO | Admitting: Gastroenterology

## 2018-06-12 DIAGNOSIS — M1990 Unspecified osteoarthritis, unspecified site: Secondary | ICD-10-CM | POA: Diagnosis not present

## 2018-06-12 DIAGNOSIS — M545 Low back pain: Secondary | ICD-10-CM | POA: Diagnosis not present

## 2018-06-12 DIAGNOSIS — G8929 Other chronic pain: Secondary | ICD-10-CM | POA: Diagnosis not present

## 2018-06-12 DIAGNOSIS — F329 Major depressive disorder, single episode, unspecified: Secondary | ICD-10-CM | POA: Diagnosis not present

## 2018-06-12 DIAGNOSIS — I10 Essential (primary) hypertension: Secondary | ICD-10-CM | POA: Diagnosis not present

## 2018-06-12 DIAGNOSIS — M5481 Occipital neuralgia: Secondary | ICD-10-CM | POA: Diagnosis not present

## 2018-06-12 DIAGNOSIS — J449 Chronic obstructive pulmonary disease, unspecified: Secondary | ICD-10-CM | POA: Diagnosis not present

## 2018-06-12 DIAGNOSIS — E1142 Type 2 diabetes mellitus with diabetic polyneuropathy: Secondary | ICD-10-CM | POA: Diagnosis not present

## 2018-06-12 DIAGNOSIS — G43909 Migraine, unspecified, not intractable, without status migrainosus: Secondary | ICD-10-CM | POA: Diagnosis not present

## 2018-06-12 SURGERY — ESOPHAGOGASTRODUODENOSCOPY (EGD) WITH PROPOFOL
Anesthesia: General

## 2018-06-15 ENCOUNTER — Emergency Department
Admission: EM | Admit: 2018-06-15 | Discharge: 2018-06-15 | Disposition: A | Discharge status: Home / Self Care | Payer: Medicare HMO | Attending: Emergency Medicine | Admitting: Emergency Medicine

## 2018-06-15 ENCOUNTER — Emergency Department: Payer: Medicare HMO

## 2018-06-15 DIAGNOSIS — R000 Tachycardia, unspecified: Secondary | ICD-10-CM | POA: Diagnosis not present

## 2018-06-15 DIAGNOSIS — Z79899 Other long term (current) drug therapy: Secondary | ICD-10-CM | POA: Diagnosis not present

## 2018-06-15 DIAGNOSIS — Z7984 Long term (current) use of oral hypoglycemic drugs: Secondary | ICD-10-CM | POA: Insufficient documentation

## 2018-06-15 DIAGNOSIS — R1013 Epigastric pain: Secondary | ICD-10-CM | POA: Diagnosis not present

## 2018-06-15 DIAGNOSIS — M5481 Occipital neuralgia: Secondary | ICD-10-CM | POA: Diagnosis not present

## 2018-06-15 DIAGNOSIS — I10 Essential (primary) hypertension: Secondary | ICD-10-CM | POA: Diagnosis not present

## 2018-06-15 DIAGNOSIS — E119 Type 2 diabetes mellitus without complications: Secondary | ICD-10-CM | POA: Diagnosis not present

## 2018-06-15 DIAGNOSIS — J449 Chronic obstructive pulmonary disease, unspecified: Secondary | ICD-10-CM | POA: Insufficient documentation

## 2018-06-15 DIAGNOSIS — Z87891 Personal history of nicotine dependence: Secondary | ICD-10-CM | POA: Insufficient documentation

## 2018-06-15 DIAGNOSIS — F329 Major depressive disorder, single episode, unspecified: Secondary | ICD-10-CM | POA: Diagnosis not present

## 2018-06-15 DIAGNOSIS — E1142 Type 2 diabetes mellitus with diabetic polyneuropathy: Secondary | ICD-10-CM | POA: Diagnosis not present

## 2018-06-15 DIAGNOSIS — R1110 Vomiting, unspecified: Secondary | ICD-10-CM | POA: Diagnosis not present

## 2018-06-15 DIAGNOSIS — G8929 Other chronic pain: Secondary | ICD-10-CM | POA: Diagnosis not present

## 2018-06-15 DIAGNOSIS — G43909 Migraine, unspecified, not intractable, without status migrainosus: Secondary | ICD-10-CM | POA: Diagnosis not present

## 2018-06-15 DIAGNOSIS — Z7982 Long term (current) use of aspirin: Secondary | ICD-10-CM | POA: Diagnosis not present

## 2018-06-15 DIAGNOSIS — R0789 Other chest pain: Secondary | ICD-10-CM | POA: Diagnosis not present

## 2018-06-15 DIAGNOSIS — M545 Low back pain: Secondary | ICD-10-CM | POA: Diagnosis not present

## 2018-06-15 DIAGNOSIS — M1990 Unspecified osteoarthritis, unspecified site: Secondary | ICD-10-CM | POA: Diagnosis not present

## 2018-06-15 DIAGNOSIS — E1165 Type 2 diabetes mellitus with hyperglycemia: Secondary | ICD-10-CM | POA: Diagnosis not present

## 2018-06-15 DIAGNOSIS — R079 Chest pain, unspecified: Secondary | ICD-10-CM | POA: Diagnosis not present

## 2018-06-15 LAB — LIPASE, BLOOD: LIPASE: 31 U/L (ref 11–51)

## 2018-06-15 LAB — URINALYSIS, COMPLETE (UACMP) WITH MICROSCOPIC
Bilirubin Urine: NEGATIVE
Glucose, UA: NEGATIVE mg/dL
Hgb urine dipstick: NEGATIVE
Ketones, ur: NEGATIVE mg/dL
LEUKOCYTE UA: NEGATIVE
Nitrite: POSITIVE — AB
Protein, ur: NEGATIVE mg/dL
Specific Gravity, Urine: 1.009 (ref 1.005–1.030)
pH: 5 (ref 5.0–8.0)

## 2018-06-15 LAB — TROPONIN I
Troponin I: <0.03 ng/mL (ref <0.03)
Troponin I: <0.03 ng/mL (ref <0.03)

## 2018-06-15 LAB — COMPREHENSIVE METABOLIC PANEL
ALT: 9 U/L (ref 0–44)
AST: 18 U/L (ref 15–41)
Albumin: 3.3 g/dL — ABNORMAL LOW (ref 3.5–5.0)
Alkaline Phosphatase: 58 U/L (ref 38–126)
Anion gap: 9 (ref 5–15)
BILIRUBIN TOTAL: 1 mg/dL (ref 0.3–1.2)
BUN: 14 mg/dL (ref 8–23)
CHLORIDE: 107 mmol/L (ref 98–111)
CO2: 24 mmol/L (ref 22–32)
Calcium: 8.8 mg/dL — ABNORMAL LOW (ref 8.9–10.3)
Creatinine, Ser: 0.88 mg/dL (ref 0.44–1.00)
GFR calc Af Amer: >60 mL/min (ref >60)
GFR, EST NON AFRICAN AMERICAN: 60 mL/min — AB (ref >60)
Glucose, Bld: 167 mg/dL — ABNORMAL HIGH (ref 70–99)
POTASSIUM: 4.2 mmol/L (ref 3.5–5.1)
Sodium: 140 mmol/L (ref 135–145)
TOTAL PROTEIN: 5.7 g/dL — AB (ref 6.5–8.1)

## 2018-06-15 LAB — CBC WITH DIFFERENTIAL/PLATELET
Abs Immature Granulocytes: 0.03 10*3/uL (ref 0.00–0.07)
Basophils Absolute: 0 10*3/uL (ref 0.0–0.1)
Basophils Relative: 0 %
Eosinophils Absolute: 0.1 10*3/uL (ref 0.0–0.5)
Eosinophils Relative: 1 %
HCT: 30.7 % — ABNORMAL LOW (ref 36.0–46.0)
Hemoglobin: 8.5 g/dL — ABNORMAL LOW (ref 12.0–15.0)
Immature Granulocytes: 0 %
Lymphocytes Relative: 10 %
Lymphs Abs: 1 10*3/uL (ref 0.7–4.0)
MCH: 22.8 pg — ABNORMAL LOW (ref 26.0–34.0)
MCHC: 27.7 g/dL — ABNORMAL LOW (ref 30.0–36.0)
MCV: 82.5 fL (ref 80.0–100.0)
MONOS PCT: 5 %
Monocytes Absolute: 0.5 10*3/uL (ref 0.1–1.0)
Neutro Abs: 8 10*3/uL — ABNORMAL HIGH (ref 1.7–7.7)
Neutrophils Relative %: 84 %
Platelets: 273 10*3/uL (ref 150–400)
RBC: 3.72 MIL/uL — ABNORMAL LOW (ref 3.87–5.11)
RDW: 23.9 % — AB (ref 11.5–15.5)
WBC: 9.7 10*3/uL (ref 4.0–10.5)
nRBC: 0 % (ref 0.0–0.2)

## 2018-06-15 MED ORDER — SUCRALFATE 1 GM/10ML PO SUSP: 1.0000 g | Freq: Three times a day (TID) | ORAL | 0 refills | Status: DC

## 2018-06-15 MED ORDER — FOSFOMYCIN TROMETHAMINE 3 G PO PACK
3.0000 g | PACK | Freq: Once | ORAL | Status: AC
  Administered 2018-06-15: 3 g via ORAL
  Filled 2018-06-15: qty 3

## 2018-06-15 MED ORDER — ONDANSETRON HCL 4 MG PO TABS: 4.0000 mg | ORAL_TABLET | Freq: Three times a day (TID) | ORAL | 0 refills | Status: DC | PRN

## 2018-06-15 MED ORDER — ALUM & MAG HYDROXIDE-SIMETH 200-200-20 MG/5ML PO SUSP
30.0000 mL | Freq: Once | ORAL | Status: AC
  Administered 2018-06-15: 30 mL via ORAL
  Filled 2018-06-15: qty 30

## 2018-06-15 MED ORDER — FAMOTIDINE IN NACL 20-0.9 MG/50ML-% IV SOLN
20.0000 mg | Freq: Once | INTRAVENOUS | Status: AC
  Administered 2018-06-15: 20 mg via INTRAVENOUS
  Filled 2018-06-15: qty 50

## 2018-06-15 MED ORDER — IOHEXOL 300 MG/ML  SOLN
30.0000 mL | Freq: Once | INTRAMUSCULAR | Status: AC | PRN
  Administered 2018-06-15: 30 mL via INTRAVENOUS

## 2018-06-15 MED ORDER — PANTOPRAZOLE SODIUM 40 MG PO TBEC: 40.0000 mg | DELAYED_RELEASE_TABLET | Freq: Two times a day (BID) | ORAL | 1 refills | Status: DC

## 2018-06-15 MED ORDER — ONDANSETRON HCL 4 MG/2ML IJ SOLN
4.0000 mg | Freq: Once | INTRAMUSCULAR | Status: AC
  Administered 2018-06-15: 4 mg via INTRAVENOUS
  Filled 2018-06-15: qty 2

## 2018-06-15 MED ORDER — SUCRALFATE 1 G PO TABS
1.0000 g | ORAL_TABLET | Freq: Once | ORAL | Status: AC
  Administered 2018-06-15: 1 g via ORAL
  Filled 2018-06-15: qty 1

## 2018-06-15 MED ORDER — IOHEXOL 300 MG/ML  SOLN
100.0000 mL | Freq: Once | INTRAMUSCULAR | Status: AC | PRN
  Administered 2018-06-15: 100 mL via INTRAVENOUS

## 2018-06-15 NOTE — Discharge Instructions (Signed)
If you have any bleeding black stool increased pain chest pain, shortness of breath fever or you feel worse in any way return to the emergency room.  Avoid spicy food.

## 2018-06-15 NOTE — ED Provider Notes (Addendum)
[facility] Emergency Department Provider Note  ____________________________________________   I have reviewed the triage vital signs and the nursing notes. Where available I have reviewed prior notes and, if possible and indicated, outside hospital notes.    HISTORY  Chief Complaint Chest Pain    HPI Alexa Lowe is a 85 y.o. female   History of COPD diabetes mellitus and hypertension presents today complaining of epigastric abdominal pain, known history of ulcer disease she states.  Has had this very spicy food including chili all weekend and as she eats that makes her stomach hurt.  She denies any fever chills.  Had one episode of nonbloody nonbilious emesis on the way in here which is her first emesis.  Normal bowel movements.  She denies any fever or chills.  States that the pain is been constant, worse when she lies flat worse when she eats food.  She describes it as a "chest pain" but she looks at her abdomen and indicates her abdomen when she talks about it.  This is not an unusual symptom for her she states she gets this "a lot".  Denies other signs or symptoms of disease such as exertional symptoms or leg swelling.  It is a burning discomfort.  Better after she threw up.  Past Medical History:  Diagnosis Date  . Cancer [facility])    Right breast  . COPD (chronic obstructive pulmonary disease) ([place])   . Diabetes mellitus without complication ([place])   . Hypertension     Patient Active Problem List   Diagnosis Date Noted  . Symptomatic anemia 05/19/2018  . HTN (hypertension) 05/19/2018  . Diabetes ([place]) 05/19/2018  . COPD (chronic obstructive pulmonary disease) ([place]) 05/19/2018    Past Surgical History:  Procedure Laterality Date  . ABDOMINAL SURGERY    . APPENDECTOMY    . BACK SURGERY    . BREAST SURGERY    . CHOLECYSTECTOMY    . ESOPHAGOGASTRODUODENOSCOPY (EGD) WITH PROPOFOL N/A 05/20/2018   Procedure: ESOPHAGOGASTRODUODENOSCOPY (EGD) WITH  PROPOFOL;  Surgeon: Toledo, Benay Pike, MD;  Location: [facility] ENDOSCOPY;  Service: Gastroenterology;  Laterality: N/A;    Prior to Admission medications   Medication Sig Start Date End Date Taking? Authorizing Provider  albuterol (PROVENTIL HFA;VENTOLIN HFA) 108 (90 Base) MCG/ACT inhaler Inhale 2 puffs into the lungs every 4 (four) hours as needed for wheezing or shortness of breath. 02/26/17   [provider]  allopurinol (ZYLOPRIM) 300 MG tablet Take 300 mg by mouth daily.    [provider]  alum & mag hydroxide-simeth (MAALOX REGULAR STRENGTH) 200-200-20 MG/5ML suspension Take 30 mLs by mouth every 4 (four) hours as needed for indigestion or heartburn. 07/18/17   Harvest Dark, MD  aspirin EC 325 MG tablet Take 325 mg by mouth daily.    [provider]  atorvastatin (LIPITOR) 40 MG tablet Take 40 mg by mouth every evening.     [provider]  celecoxib (CELEBREX) 200 MG capsule Take 200 mg by mouth 2 (two) times daily.     [provider]  citalopram (CELEXA) 20 MG tablet Take 30 mg by mouth daily.     [provider]  gabapentin (NEURONTIN) 100 MG capsule Take 100 mg by mouth at bedtime as needed for tremors. 04/13/18   [provider]  gabapentin (NEURONTIN) 300 MG capsule Take 300 mg by mouth 3 (three) times daily.     [provider]  losartan (COZAAR) 50 MG tablet Take 50 mg by  mouth daily. 03/09/18   [provider]  metFORMIN (GLUCOPHAGE) 500 MG tablet Take 500 mg by mouth 2 (two) times daily.     [provider]  pantoprazole (PROTONIX) 40 MG tablet Take 40 mg by mouth 2 (two) times daily.     [provider]  sucralfate (CARAFATE) 1 GM/10ML suspension Take 10 mLs (1 g total) by mouth 4 (four) times daily -  with meals and at bedtime for 30 days. 05/21/18 06/20/18  Dustin Flock, MD    Allergies Morphine and related and Codeine  No family history on file.  Social History Social  History   Tobacco Use  . Smoking status: Former Research scientist (life sciences)  . Smokeless tobacco: Never Used  Substance Use Topics  . Alcohol use: No  . Drug use: No    Review of Systems Constitutional: No fever/chills Eyes: No visual changes. ENT: No sore throat. No stiff neck no neck pain Cardiovascular: Denies chest pain. Respiratory: Denies shortness of breath. Gastrointestinal: See HPI Genitourinary: Negative for dysuria. Musculoskeletal: Negative lower extremity swelling Skin: Negative for rash. Neurological: Negative for severe headaches, focal weakness or numbness.   ____________________________________________   PHYSICAL EXAM:  VITAL SIGNS: ED Triage Vitals  Enc Vitals Group     BP 06/15/18 1427 130/70     Pulse Rate 06/15/18 1416 64     Resp 06/15/18 1427 16     Temp 06/15/18 1427 98.4 F (36.9 C)     Temp Source 06/15/18 1427 Oral     SpO2 06/15/18 1427 94 %     Weight 06/15/18 1417 210 lb 15.7 oz (95.7 kg)     Height 06/15/18 1427 5\' 3"  (1.6 m)     Head Circumference --      Peak Flow --      Pain Score 06/15/18 1417 0     Pain Loc --      Pain Edu? --      Excl. in [place]? --     Constitutional: Alert and oriented. Well appearing and in no acute distress. Eyes: Conjunctivae are normal Head: Atraumatic HEENT: No congestion/rhinnorhea. Mucous membranes are moist.  Oropharynx non-erythematous Neck:   Nontender with no meningismus, no masses, no stridor Cardiovascular: Normal rate, regular rhythm. Grossly normal heart sounds.  Good peripheral circulation. Respiratory: Normal respiratory effort.  No retractions. Lungs CTAB. Abdominal: Soft and epigastric discomfort, does reproduce the patient's discomfort.  No surgical signs.. No distention. No guarding no rebound Back:  There is no focal tenderness or step off.  there is no midline tenderness there are no lesions noted. there is no CVA tenderness Musculoskeletal: No lower extremity tenderness, no upper extremity tenderness.  No joint effusions, no DVT signs strong distal pulses no edema Neurologic:  Normal speech and language. No gross focal neurologic deficits are appreciated.  Skin:  Skin is warm, dry and intact. No rash noted. Psychiatric: Mood and affect are normal. Speech and behavior are normal.  ____________________________________________   LABS (all labs ordered are listed, but only abnormal results are displayed)  Labs Reviewed  CBC WITH DIFFERENTIAL/PLATELET  COMPREHENSIVE METABOLIC PANEL  LIPASE, BLOOD  TROPONIN I    Pertinent labs  results that were available during my care of the patient were reviewed by me and considered in my medical decision making (see chart for details). ____________________________________________  EKG  I personally interpreted any EKGs ordered by me or triage Sinus rhythm rate 61 bpm no acute ST elevation or depression borderline LAD ____________________________________________  RADIOLOGY  Pertinent labs & imaging results that were available during my care of the patient were reviewed by me and considered in my medical decision making (see chart for details). If possible, patient and/or family made aware of any abnormal findings.  No results found. ____________________________________________    PROCEDURES  Procedure(s) performed: None  Procedures  Critical Care performed: None  ____________________________________________   INITIAL IMPRESSION / ASSESSMENT AND PLAN / ED COURSE  Pertinent labs & imaging results that were available during my care of the patient were reviewed by me and considered in my medical decision making (see chart for details). Here with epigastric abdominal pain, and some emesis which is nonbloody in the weekend while she has been eating spicy chili as she reports.  Her belly is nonsurgical, I do not think this represents ACS, we did do an EKG which is reassuring, will send cardiac enzymes, as a precaution.  Patient is in no acute  medical distress we will treat her dyspepsia and her nausea and reassess.  ----------------------------------------- 3:32 PM on 06/15/2018 -----------------------------------------  Pt pain free after pepcid and maalox.    ____________________________________________   FINAL CLINICAL IMPRESSION(S) / ED DIAGNOSES  Final diagnoses:  None      This chart was dictated using voice recognition software.  Despite best efforts to proofread,  errors can occur which can change meaning.      Schuyler Amor, MD 06/15/18 1457    Schuyler Amor, MD 06/15/18 [phone]

## 2018-06-15 NOTE — ED Triage Notes (Signed)
Pt arrives to ED from [facility]. Pt has had CP since Friday had an appt with Cardiologist on Friday and refused to go. Pt has ulcer but still eating chili and sausage then CP started.   VSS 160/80 96 272  Nitro and 324 Asprin

## 2018-06-15 NOTE — ED Notes (Signed)
Pt stating that her cp increases. New EKG was performed. Family at bedside. NEW EKG given to EDP

## 2018-06-15 NOTE — ED Notes (Signed)
Pt signed hardcopy of E-signature.  

## 2018-06-18 DIAGNOSIS — F329 Major depressive disorder, single episode, unspecified: Secondary | ICD-10-CM | POA: Diagnosis not present

## 2018-06-18 DIAGNOSIS — M5481 Occipital neuralgia: Secondary | ICD-10-CM | POA: Diagnosis not present

## 2018-06-18 DIAGNOSIS — G8929 Other chronic pain: Secondary | ICD-10-CM | POA: Diagnosis not present

## 2018-06-18 DIAGNOSIS — M545 Low back pain: Secondary | ICD-10-CM | POA: Diagnosis not present

## 2018-06-18 DIAGNOSIS — G43909 Migraine, unspecified, not intractable, without status migrainosus: Secondary | ICD-10-CM | POA: Diagnosis not present

## 2018-06-18 DIAGNOSIS — E1142 Type 2 diabetes mellitus with diabetic polyneuropathy: Secondary | ICD-10-CM | POA: Diagnosis not present

## 2018-06-18 DIAGNOSIS — M1990 Unspecified osteoarthritis, unspecified site: Secondary | ICD-10-CM | POA: Diagnosis not present

## 2018-06-18 DIAGNOSIS — I10 Essential (primary) hypertension: Secondary | ICD-10-CM | POA: Diagnosis not present

## 2018-06-18 DIAGNOSIS — J449 Chronic obstructive pulmonary disease, unspecified: Secondary | ICD-10-CM | POA: Diagnosis not present

## 2018-06-18 LAB — URINE CULTURE: Culture: >=100000 — AB

## 2018-06-19 NOTE — Progress Notes (Signed)
ED Culture Review: ED Culture Review: Urine Culture with >100,000 E. Coli sensitive to cefazolin Dr. Cinda Quest reviewed report and recommendation for Keflex 500mg  tid x 7 days. Called and spoke with patient. Per her request, prescription called into [facility].

## 2018-06-20 DIAGNOSIS — M545 Low back pain: Secondary | ICD-10-CM | POA: Diagnosis not present

## 2018-06-20 DIAGNOSIS — M150 Primary generalized (osteo)arthritis: Secondary | ICD-10-CM | POA: Diagnosis not present

## 2018-06-24 DIAGNOSIS — M545 Low back pain: Secondary | ICD-10-CM | POA: Diagnosis not present

## 2018-06-24 DIAGNOSIS — G43909 Migraine, unspecified, not intractable, without status migrainosus: Secondary | ICD-10-CM | POA: Diagnosis not present

## 2018-06-24 DIAGNOSIS — F329 Major depressive disorder, single episode, unspecified: Secondary | ICD-10-CM | POA: Diagnosis not present

## 2018-06-24 DIAGNOSIS — M5481 Occipital neuralgia: Secondary | ICD-10-CM | POA: Diagnosis not present

## 2018-06-24 DIAGNOSIS — E1142 Type 2 diabetes mellitus with diabetic polyneuropathy: Secondary | ICD-10-CM | POA: Diagnosis not present

## 2018-06-24 DIAGNOSIS — G8929 Other chronic pain: Secondary | ICD-10-CM | POA: Diagnosis not present

## 2018-06-24 DIAGNOSIS — I10 Essential (primary) hypertension: Secondary | ICD-10-CM | POA: Diagnosis not present

## 2018-06-24 DIAGNOSIS — M1990 Unspecified osteoarthritis, unspecified site: Secondary | ICD-10-CM | POA: Diagnosis not present

## 2018-06-24 DIAGNOSIS — J449 Chronic obstructive pulmonary disease, unspecified: Secondary | ICD-10-CM | POA: Diagnosis not present

## 2018-07-21 DIAGNOSIS — M150 Primary generalized (osteo)arthritis: Secondary | ICD-10-CM | POA: Diagnosis not present

## 2018-07-21 DIAGNOSIS — M545 Low back pain: Secondary | ICD-10-CM | POA: Diagnosis not present

## 2018-08-19 DIAGNOSIS — B379 Candidiasis, unspecified: Secondary | ICD-10-CM | POA: Diagnosis not present

## 2018-08-19 DIAGNOSIS — N760 Acute vaginitis: Secondary | ICD-10-CM | POA: Diagnosis not present

## 2018-08-20 DIAGNOSIS — M150 Primary generalized (osteo)arthritis: Secondary | ICD-10-CM | POA: Diagnosis not present

## 2018-08-20 DIAGNOSIS — M545 Low back pain: Secondary | ICD-10-CM | POA: Diagnosis not present

## 2018-08-23 ENCOUNTER — Other Ambulatory Visit: Payer: Self-pay

## 2018-08-23 ENCOUNTER — Encounter: Payer: Self-pay | Admitting: Emergency Medicine

## 2018-08-23 ENCOUNTER — Inpatient Hospital Stay
Admission: EM | Admit: 2018-08-23 | Discharge: 2018-09-02 | DRG: 377 | Disposition: A | Discharge status: Home Health | Payer: Medicare HMO | Attending: Family Medicine | Admitting: Family Medicine

## 2018-08-23 ENCOUNTER — Emergency Department: Payer: Medicare HMO

## 2018-08-23 DIAGNOSIS — Z7189 Other specified counseling: Secondary | ICD-10-CM | POA: Diagnosis not present

## 2018-08-23 DIAGNOSIS — Z87891 Personal history of nicotine dependence: Secondary | ICD-10-CM

## 2018-08-23 DIAGNOSIS — R471 Dysarthria and anarthria: Secondary | ICD-10-CM | POA: Diagnosis present

## 2018-08-23 DIAGNOSIS — Z885 Allergy status to narcotic agent status: Secondary | ICD-10-CM | POA: Diagnosis not present

## 2018-08-23 DIAGNOSIS — K567 Ileus, unspecified: Secondary | ICD-10-CM

## 2018-08-23 DIAGNOSIS — K625 Hemorrhage of anus and rectum: Secondary | ICD-10-CM | POA: Diagnosis not present

## 2018-08-23 DIAGNOSIS — G9340 Encephalopathy, unspecified: Secondary | ICD-10-CM | POA: Diagnosis present

## 2018-08-23 DIAGNOSIS — I10 Essential (primary) hypertension: Secondary | ICD-10-CM | POA: Diagnosis present

## 2018-08-23 DIAGNOSIS — Z853 Personal history of malignant neoplasm of breast: Secondary | ICD-10-CM | POA: Diagnosis not present

## 2018-08-23 DIAGNOSIS — Z20828 Contact with and (suspected) exposure to other viral communicable diseases: Secondary | ICD-10-CM | POA: Diagnosis present

## 2018-08-23 DIAGNOSIS — Z515 Encounter for palliative care: Secondary | ICD-10-CM | POA: Diagnosis present

## 2018-08-23 DIAGNOSIS — R404 Transient alteration of awareness: Secondary | ICD-10-CM | POA: Diagnosis not present

## 2018-08-23 DIAGNOSIS — Z7982 Long term (current) use of aspirin: Secondary | ICD-10-CM | POA: Diagnosis not present

## 2018-08-23 DIAGNOSIS — Z7984 Long term (current) use of oral hypoglycemic drugs: Secondary | ICD-10-CM | POA: Diagnosis not present

## 2018-08-23 DIAGNOSIS — Z79899 Other long term (current) drug therapy: Secondary | ICD-10-CM

## 2018-08-23 DIAGNOSIS — I6523 Occlusion and stenosis of bilateral carotid arteries: Secondary | ICD-10-CM | POA: Diagnosis not present

## 2018-08-23 DIAGNOSIS — I639 Cerebral infarction, unspecified: Secondary | ICD-10-CM | POA: Diagnosis present

## 2018-08-23 DIAGNOSIS — E119 Type 2 diabetes mellitus without complications: Secondary | ICD-10-CM | POA: Diagnosis present

## 2018-08-23 DIAGNOSIS — Z66 Do not resuscitate: Secondary | ICD-10-CM | POA: Diagnosis present

## 2018-08-23 DIAGNOSIS — J449 Chronic obstructive pulmonary disease, unspecified: Secondary | ICD-10-CM | POA: Diagnosis present

## 2018-08-23 DIAGNOSIS — I700 Atherosclerosis of aorta: Secondary | ICD-10-CM | POA: Diagnosis not present

## 2018-08-23 DIAGNOSIS — Z8711 Personal history of peptic ulcer disease: Secondary | ICD-10-CM

## 2018-08-23 DIAGNOSIS — K922 Gastrointestinal hemorrhage, unspecified: Secondary | ICD-10-CM | POA: Diagnosis not present

## 2018-08-23 DIAGNOSIS — K254 Chronic or unspecified gastric ulcer with hemorrhage: Principal | ICD-10-CM | POA: Diagnosis present

## 2018-08-23 DIAGNOSIS — K921 Melena: Secondary | ICD-10-CM | POA: Diagnosis not present

## 2018-08-23 DIAGNOSIS — D62 Acute posthemorrhagic anemia: Secondary | ICD-10-CM | POA: Diagnosis present

## 2018-08-23 DIAGNOSIS — G459 Transient cerebral ischemic attack, unspecified: Secondary | ICD-10-CM

## 2018-08-23 DIAGNOSIS — J984 Other disorders of lung: Secondary | ICD-10-CM | POA: Diagnosis not present

## 2018-08-23 DIAGNOSIS — K259 Gastric ulcer, unspecified as acute or chronic, without hemorrhage or perforation: Secondary | ICD-10-CM | POA: Diagnosis not present

## 2018-08-23 DIAGNOSIS — D649 Anemia, unspecified: Secondary | ICD-10-CM | POA: Diagnosis not present

## 2018-08-23 DIAGNOSIS — R4182 Altered mental status, unspecified: Secondary | ICD-10-CM | POA: Diagnosis not present

## 2018-08-23 DIAGNOSIS — R410 Disorientation, unspecified: Secondary | ICD-10-CM | POA: Diagnosis not present

## 2018-08-23 DIAGNOSIS — G9389 Other specified disorders of brain: Secondary | ICD-10-CM | POA: Diagnosis not present

## 2018-08-23 DIAGNOSIS — K219 Gastro-esophageal reflux disease without esophagitis: Secondary | ICD-10-CM | POA: Diagnosis not present

## 2018-08-23 LAB — COMPREHENSIVE METABOLIC PANEL
ALT: 11 U/L (ref 0–44)
AST: 21 U/L (ref 15–41)
Albumin: 3.8 g/dL (ref 3.5–5.0)
Alkaline Phosphatase: 83 U/L (ref 38–126)
Anion gap: 12 (ref 5–15)
BUN: 19 mg/dL (ref 8–23)
CO2: 22 mmol/L (ref 22–32)
Calcium: 9.3 mg/dL (ref 8.9–10.3)
Chloride: 107 mmol/L (ref 98–111)
Creatinine, Ser: 1.11 mg/dL — ABNORMAL HIGH (ref 0.44–1.00)
GFR calc Af Amer: 52 mL/min — ABNORMAL LOW (ref >60)
GFR calc non Af Amer: 45 mL/min — ABNORMAL LOW (ref >60)
Glucose, Bld: 183 mg/dL — ABNORMAL HIGH (ref 70–99)
Potassium: 3.8 mmol/L (ref 3.5–5.1)
Sodium: 141 mmol/L (ref 135–145)
Total Bilirubin: 0.6 mg/dL (ref 0.3–1.2)
Total Protein: 6.9 g/dL (ref 6.5–8.1)

## 2018-08-23 LAB — CBC
HCT: 39.4 % (ref 36.0–46.0)
Hemoglobin: 11.7 g/dL — ABNORMAL LOW (ref 12.0–15.0)
MCH: 27.1 pg (ref 26.0–34.0)
MCHC: 29.7 g/dL — ABNORMAL LOW (ref 30.0–36.0)
MCV: 91.2 fL (ref 80.0–100.0)
Platelets: 237 10*3/uL (ref 150–400)
RBC: 4.32 MIL/uL (ref 3.87–5.11)
RDW: 17.2 % — ABNORMAL HIGH (ref 11.5–15.5)
WBC: 11.2 10*3/uL — ABNORMAL HIGH (ref 4.0–10.5)
nRBC: 0 % (ref 0.0–0.2)

## 2018-08-23 NOTE — ED Triage Notes (Signed)
Patient brought in by ems from home for altered mental status. Per ems patient called ems and patient's last known normal was 12 hours prior to arrival. Patient alert and oriented times two at this time.

## 2018-08-23 NOTE — ED Notes (Signed)
Ems states pt with "confusion all day". Per ems fsbs 152, vss. Pt was last seen normal 12 hours ago. Ems states pt able to stand and walk without difficulty, ems states negative stroke screen for them.

## 2018-08-23 NOTE — ED Notes (Signed)
Patient transported to CT 

## 2018-08-24 ENCOUNTER — Other Ambulatory Visit: Payer: Medicare HMO

## 2018-08-24 ENCOUNTER — Encounter: Payer: Self-pay | Admitting: Internal Medicine

## 2018-08-24 ENCOUNTER — Emergency Department: Payer: Medicare HMO

## 2018-08-24 DIAGNOSIS — R4182 Altered mental status, unspecified: Secondary | ICD-10-CM

## 2018-08-24 DIAGNOSIS — G459 Transient cerebral ischemic attack, unspecified: Secondary | ICD-10-CM | POA: Diagnosis present

## 2018-08-24 LAB — GLUCOSE, CAPILLARY
Glucose-Capillary: 107 mg/dL — ABNORMAL HIGH (ref 70–99)
Glucose-Capillary: 118 mg/dL — ABNORMAL HIGH (ref 70–99)
Glucose-Capillary: 141 mg/dL — ABNORMAL HIGH (ref 70–99)
Glucose-Capillary: 148 mg/dL — ABNORMAL HIGH (ref 70–99)

## 2018-08-24 LAB — URINALYSIS, COMPLETE (UACMP) WITH MICROSCOPIC
Bacteria, UA: NONE SEEN
Bilirubin Urine: NEGATIVE
Glucose, UA: NEGATIVE mg/dL
Hgb urine dipstick: NEGATIVE
Ketones, ur: NEGATIVE mg/dL
Leukocytes,Ua: NEGATIVE
Nitrite: NEGATIVE
Protein, ur: NEGATIVE mg/dL
Specific Gravity, Urine: 1.02 (ref 1.005–1.030)
pH: 5 (ref 5.0–8.0)

## 2018-08-24 LAB — SEDIMENTATION RATE: Sed Rate: 28 mm/hr (ref 0–30)

## 2018-08-24 LAB — SARS CORONAVIRUS 2 BY RT PCR (HOSPITAL ORDER, PERFORMED IN ~~LOC~~ HOSPITAL LAB): SARS Coronavirus 2: NEGATIVE

## 2018-08-24 LAB — TSH: TSH: 2.438 u[IU]/mL (ref 0.350–4.500)

## 2018-08-24 MED ORDER — PANTOPRAZOLE SODIUM 40 MG PO TBEC
40.0000 mg | DELAYED_RELEASE_TABLET | Freq: Two times a day (BID) | ORAL | Status: DC
  Administered 2018-08-27 – 2018-08-28 (×2): 40 mg via ORAL
  Filled 2018-08-24 (×2): qty 1

## 2018-08-24 MED ORDER — ALBUTEROL SULFATE (2.5 MG/3ML) 0.083% IN NEBU
2.5000 mg | INHALATION_SOLUTION | RESPIRATORY_TRACT | Status: DC | PRN
  Administered 2018-09-01: 12:00:00 2.5 mg via RESPIRATORY_TRACT
  Filled 2018-08-24: qty 3

## 2018-08-24 MED ORDER — INSULIN ASPART 100 UNIT/ML ~~LOC~~ SOLN: 0.0000 [IU] | Freq: Every day | SUBCUTANEOUS | Status: DC

## 2018-08-24 MED ORDER — ATORVASTATIN CALCIUM 20 MG PO TABS
40.0000 mg | ORAL_TABLET | Freq: Every day | ORAL | Status: DC
  Administered 2018-08-27 – 2018-09-01 (×6): 40 mg via ORAL
  Filled 2018-08-24 (×6): qty 2

## 2018-08-24 MED ORDER — ONDANSETRON HCL 4 MG/2ML IJ SOLN
4.0000 mg | Freq: Four times a day (QID) | INTRAMUSCULAR | Status: DC | PRN
  Administered 2018-08-24 – 2018-08-25 (×3): 4 mg via INTRAVENOUS
  Filled 2018-08-24 (×5): qty 2

## 2018-08-24 MED ORDER — ACETAMINOPHEN 650 MG RE SUPP
650.0000 mg | RECTAL | Status: DC | PRN
  Administered 2018-08-25: 12:00:00 650 mg via RECTAL
  Filled 2018-08-24: qty 1

## 2018-08-24 MED ORDER — METOPROLOL TARTRATE 25 MG PO TABS
25.0000 mg | ORAL_TABLET | Freq: Two times a day (BID) | ORAL | Status: DC
  Filled 2018-08-24: qty 1

## 2018-08-24 MED ORDER — MELATONIN 5 MG PO TABS
10.0000 mg | ORAL_TABLET | Freq: Every day | ORAL | Status: DC
  Administered 2018-08-27 – 2018-09-01 (×6): 10 mg via ORAL
  Filled 2018-08-24 (×10): qty 2

## 2018-08-24 MED ORDER — ASPIRIN 81 MG PO CHEW
324.0000 mg | CHEWABLE_TABLET | Freq: Once | ORAL | Status: AC
  Administered 2018-08-24: 324 mg via ORAL
  Filled 2018-08-24: qty 4

## 2018-08-24 MED ORDER — NYSTATIN 100000 UNIT/GM EX OINT
1.0000 "application " | TOPICAL_OINTMENT | Freq: Two times a day (BID) | CUTANEOUS | Status: DC
  Administered 2018-08-25 – 2018-09-02 (×17): 1 via TOPICAL
  Filled 2018-08-24 (×2): qty 15

## 2018-08-24 MED ORDER — SODIUM CHLORIDE 0.9 % IV BOLUS
500.0000 mL | Freq: Once | INTRAVENOUS | Status: AC
  Administered 2018-08-24: 500 mL via INTRAVENOUS

## 2018-08-24 MED ORDER — ASPIRIN 325 MG PO TABS
325.0000 mg | ORAL_TABLET | Freq: Every day | ORAL | Status: DC
  Filled 2018-08-24 (×3): qty 1

## 2018-08-24 MED ORDER — ACETAMINOPHEN 160 MG/5ML PO SOLN
650.0000 mg | ORAL | Status: DC | PRN
  Filled 2018-08-24: qty 20.3

## 2018-08-24 MED ORDER — ENOXAPARIN SODIUM 40 MG/0.4ML ~~LOC~~ SOLN
40.0000 mg | SUBCUTANEOUS | Status: DC
  Administered 2018-08-25 – 2018-08-27 (×2): 40 mg via SUBCUTANEOUS
  Filled 2018-08-24 (×3): qty 0.4

## 2018-08-24 MED ORDER — CITALOPRAM HYDROBROMIDE 20 MG PO TABS
30.0000 mg | ORAL_TABLET | Freq: Every day | ORAL | Status: DC
  Administered 2018-08-28 – 2018-09-02 (×6): 30 mg via ORAL
  Filled 2018-08-24 (×6): qty 2

## 2018-08-24 MED ORDER — GABAPENTIN 300 MG PO CAPS: 300.0000 mg | ORAL_CAPSULE | Freq: Three times a day (TID) | ORAL | Status: DC

## 2018-08-24 MED ORDER — ALBUTEROL SULFATE HFA 108 (90 BASE) MCG/ACT IN AERS: 2.0000 | INHALATION_SPRAY | RESPIRATORY_TRACT | Status: DC | PRN

## 2018-08-24 MED ORDER — INSULIN ASPART 100 UNIT/ML ~~LOC~~ SOLN
0.0000 [IU] | Freq: Three times a day (TID) | SUBCUTANEOUS | Status: DC
  Administered 2018-08-24: 1 [IU] via SUBCUTANEOUS
  Administered 2018-08-25: 2 [IU] via SUBCUTANEOUS
  Administered 2018-08-25 – 2018-08-26 (×3): 1 [IU] via SUBCUTANEOUS
  Filled 2018-08-24 (×5): qty 1

## 2018-08-24 MED ORDER — STROKE: EARLY STAGES OF RECOVERY BOOK
Freq: Once | Status: AC
  Administered 2018-08-24: 18:00:00

## 2018-08-24 MED ORDER — ASPIRIN 300 MG RE SUPP
300.0000 mg | Freq: Every day | RECTAL | Status: DC
  Administered 2018-08-25: 12:00:00 300 mg via RECTAL
  Filled 2018-08-24: qty 1

## 2018-08-24 MED ORDER — ACETAMINOPHEN 325 MG PO TABS: 650.0000 mg | ORAL_TABLET | ORAL | Status: DC | PRN

## 2018-08-24 MED ORDER — SUCRALFATE 1 G PO TABS
1.0000 g | ORAL_TABLET | Freq: Four times a day (QID) | ORAL | Status: DC
  Administered 2018-08-27 – 2018-09-02 (×23): 1 g via ORAL
  Filled 2018-08-24 (×24): qty 1

## 2018-08-24 NOTE — H&P (Signed)
[place] [facility] [place] NAME: Alexa Lowe    MR#:  [id number]  DATE OF BIRTH:  01/05/1931  DATE OF ADMISSION:  08/23/2018  PRIMARY CARE PHYSICIAN: Dion Body, MD   REQUESTING/REFERRING PHYSICIAN:   CHIEF COMPLAINT:   Chief Complaint  Patient presents with  . Altered Mental Status    HISTORY OF PRESENT ILLNESS: Alexa Lowe  is a 85 y.o. female with a known history of COPD, type 2 diabetes mellitus, breast cancer, hypertension presented to the emergency room for confusion.  Patient also had trouble in speech.  Patient was referred to the emergency room she was worked up with MRI brain which showed no acute abnormality.  Speech appeared better in the emergency room.  No weakness or tingling or numbness in any part of the body.  Hospitalist service was consulted.  No history of fall or head injury.  Patient more awake and alert in the emergency room and responds to all verbal commands.  In the emergency room patient was worked up with CT head and MRI brain. PAST MEDICAL HISTORY:   Past Medical History:  Diagnosis Date  . Cancer [facility])    Right breast  . COPD (chronic obstructive pulmonary disease) ([place])   . Diabetes mellitus without complication ([place])   . Hypertension     PAST SURGICAL HISTORY:  Past Surgical History:  Procedure Laterality Date  . ABDOMINAL SURGERY    . APPENDECTOMY    . BACK SURGERY    . BREAST SURGERY    . CHOLECYSTECTOMY    . ESOPHAGOGASTRODUODENOSCOPY (EGD) WITH PROPOFOL N/A 05/20/2018   Procedure: ESOPHAGOGASTRODUODENOSCOPY (EGD) WITH PROPOFOL;  Surgeon: Toledo, Benay Pike, MD;  Location: [facility] ENDOSCOPY;  Service: Gastroenterology;  Laterality: N/A;    SOCIAL HISTORY:  Social History   Tobacco Use  . Smoking status: Former Research scientist (life sciences)  . Smokeless tobacco: Never Used  Substance Use Topics  . Alcohol use: No    FAMILY HISTORY: No family history on file.  DRUG ALLERGIES:  Allergies  Allergen  Reactions  . Morphine And Related Other (See Comments)    Reaction:  Hallucinations   . Codeine Nausea And Vomiting    REVIEW OF SYSTEMS:   CONSTITUTIONAL: No fever, fatigue or weakness.  EYES: No blurred or double vision.  EARS, NOSE, AND THROAT: No tinnitus or ear pain.  RESPIRATORY: No cough, shortness of breath, wheezing or hemoptysis.  CARDIOVASCULAR: No chest pain, orthopnea, edema.  GASTROINTESTINAL: No nausea, vomiting, diarrhea or abdominal pain.  GENITOURINARY: No dysuria, hematuria.  ENDOCRINE: No polyuria, nocturia,  HEMATOLOGY: No anemia, easy bruising or bleeding SKIN: No rash or lesion. MUSCULOSKELETAL: No joint pain or arthritis.   NEUROLOGIC: No tingling, numbness, weakness.  Had garbled speech PSYCHIATRY: No anxiety or depression.   MEDICATIONS AT HOME:  Prior to Admission medications   Medication Sig Start Date End Date Taking? Authorizing Provider  acetaminophen (TYLENOL) 500 MG tablet Take 1,000 mg by mouth every 8 (eight) hours as needed for mild pain or moderate pain.   Yes [provider]  albuterol (PROVENTIL HFA;VENTOLIN HFA) 108 (90 Base) MCG/ACT inhaler Inhale 2 puffs into the lungs every 4 (four) hours as needed for wheezing or shortness of breath. 02/26/17  Yes [provider]  aspirin EC 325 MG tablet Take 325 mg by mouth daily.   Yes [provider]  atorvastatin (LIPITOR) 40 MG tablet Take 40 mg by mouth at bedtime.    Yes [provider]  celecoxib (CELEBREX) 200 MG capsule Take 200 mg by mouth 2 (two) times daily.    Yes [provider]  citalopram (CELEXA) 20 MG tablet Take 30 mg by mouth daily.    Yes [provider]  fluconazole (DIFLUCAN) 150 MG tablet Take 150 mg by mouth once. (may repeat in 7 days if needed) 08/19/18  Yes [provider]  gabapentin (NEURONTIN) 100 MG capsule Take 100 mg by mouth at bedtime as needed for tremors. 04/13/18  Yes [provider]  gabapentin  (NEURONTIN) 300 MG capsule Take 300 mg by mouth 3 (three) times daily.    Yes [provider]  Melatonin 5 MG TABS Take 10 mg by mouth at bedtime.   Yes [provider]  metFORMIN (GLUCOPHAGE) 500 MG tablet Take 500 mg by mouth 2 (two) times daily.    Yes [provider]  nystatin ointment (MYCOSTATIN) Apply 1 application topically 2 (two) times a day. 08/19/18  Yes [provider]  pantoprazole (PROTONIX) 40 MG tablet Take 1 tablet (40 mg total) by mouth 2 (two) times daily. 06/15/18  Yes Schuyler Amor, MD  sucralfate (CARAFATE) 1 g tablet Take 1 g by mouth 4 (four) times daily. 07/17/18  Yes [provider]  ondansetron (ZOFRAN) 4 MG tablet Take 1 tablet (4 mg total) by mouth every 8 (eight) hours as needed for nausea or vomiting. Patient not taking: Reported on 08/24/2018 06/15/18   Schuyler Amor, MD  sucralfate (CARAFATE) 1 GM/10ML suspension Take 10 mLs (1 g total) by mouth 4 (four) times daily -  with meals and at bedtime for 30 days. Patient not taking: Reported on 08/24/2018 06/15/18 07/15/18  Schuyler Amor, MD      PHYSICAL EXAMINATION:   VITAL SIGNS: Blood pressure (!) 186/69, pulse (!) 103, temperature 97.8 F (36.6 C), temperature source Oral, resp. rate 20, height 5\' 3"  (1.6 m), weight 90.7 kg, SpO2 96 %.  GENERAL:  85 y.o.-year-old patient lying in the bed with no acute distress.  EYES: Pupils equal, round, reactive to light and accommodation. No scleral icterus. Extraocular muscles intact.  HEENT: Head atraumatic, normocephalic. Oropharynx and nasopharynx clear.  NECK:  Supple, no jugular venous distention. No thyroid enlargement, no tenderness.  LUNGS: Normal breath sounds bilaterally, no wheezing, rales,rhonchi or crepitation. No use of accessory muscles of respiration.  CARDIOVASCULAR: S1, S2 normal. No murmurs, rubs, or gallops.  ABDOMEN: Soft, nontender, nondistended. Bowel sounds present. No organomegaly or mass.  EXTREMITIES: No  pedal edema, cyanosis, or clubbing.  NEUROLOGIC: Cranial nerves II through XII are intact. Muscle strength 5/5 in all extremities. Sensation intact. Gait not checked.  PSYCHIATRIC: The patient is alert and oriented x 3.  SKIN: No obvious rash, lesion, or ulcer.   LABORATORY PANEL:   CBC Recent Labs  Lab 08/23/18 2208  WBC 11.2*  HGB 11.7*  HCT 39.4  PLT 237  MCV 91.2  MCH 27.1  MCHC 29.7*  RDW 17.2*   ------------------------------------------------------------------------------------------------------------------  Chemistries  Recent Labs  Lab 08/23/18 2208  NA 141  K 3.8  CL 107  CO2 22  GLUCOSE 183*  BUN 19  CREATININE 1.11*  CALCIUM 9.3  AST 21  ALT 11  ALKPHOS 83  BILITOT 0.6   ------------------------------------------------------------------------------------------------------------------ estimated creatinine clearance is 39.6 mL/min (A) (by C-G formula based on SCr of 1.11 mg/dL (H)). ------------------------------------------------------------------------------------------------------------------ No results for input(s): TSH, T4TOTAL, T3FREE, THYROIDAB in the last 72 hours.  Invalid input(s): FREET3   Coagulation profile  No results for input(s): INR, PROTIME in the last 168 hours. ------------------------------------------------------------------------------------------------------------------- No results for input(s): DDIMER in the last 72 hours. -------------------------------------------------------------------------------------------------------------------  Cardiac Enzymes No results for input(s): CKMB, TROPONINI, MYOGLOBIN in the last 168 hours.  Invalid input(s): CK ------------------------------------------------------------------------------------------------------------------ Invalid input(s): POCBNP  ---------------------------------------------------------------------------------------------------------------  Urinalysis     Component Value Date/Time   COLORURINE YELLOW (A) 08/24/2018 0421   APPEARANCEUR CLEAR (A) 08/24/2018 0421   APPEARANCEUR Clear 12/08/2012 1942   LABSPEC 1.020 08/24/2018 0421   LABSPEC 1.017 12/08/2012 1942   PHURINE 5.0 08/24/2018 0421   GLUCOSEU NEGATIVE 08/24/2018 0421   GLUCOSEU Negative 12/08/2012 1942   HGBUR NEGATIVE 08/24/2018 0421   BILIRUBINUR NEGATIVE 08/24/2018 0421   BILIRUBINUR Negative 12/08/2012 1942   KETONESUR NEGATIVE 08/24/2018 0421   PROTEINUR NEGATIVE 08/24/2018 0421   NITRITE NEGATIVE 08/24/2018 0421   LEUKOCYTESUR NEGATIVE 08/24/2018 0421   LEUKOCYTESUR Negative 12/08/2012 1942     RADIOLOGY: Ct Head Wo Contrast  Result Date: 08/23/2018 CLINICAL DATA:  Confusion EXAM: CT HEAD WITHOUT CONTRAST TECHNIQUE: Contiguous axial images were obtained from the base of the skull through the vertex without intravenous contrast. COMPARISON:  10/28/2014 FINDINGS: Brain: No evidence of acute infarction, hemorrhage, hydrocephalus, extra-axial collection or mass lesion/mass effect. Periventricular white matter hypodensity. Vascular: No hyperdense vessel or unexpected calcification. Skull: Normal. Negative for fracture or focal lesion. Sinuses/Orbits: No acute finding. Other: None. IMPRESSION: No acute intracranial pathology.  Small-vessel white matter disease. Electronically Signed   By: Eddie Candle M.D.   On: 08/23/2018 22:35   Mr Brain Wo Contrast  Result Date: 08/24/2018 CLINICAL DATA:  85 y/o F; altered mental status. Last known normal 12 hours prior to arrival. EXAM: MRI HEAD WITHOUT CONTRAST TECHNIQUE: Multiplanar, multiecho pulse sequences of the brain and surrounding structures were obtained without intravenous contrast. COMPARISON:  08/23/2018 CT head. FINDINGS: Brain: No acute infarction, hemorrhage, hydrocephalus, extra-axial collection or mass lesion. Early confluent nonspecific T2 FLAIR hyperintensities in subcortical and periventricular white matter as well as the  pons are compatible with moderate chronic microvascular ischemic changes. Moderate volume loss of the brain. Vascular: Normal flow voids. Skull and upper cervical spine: Normal marrow signal. Sinuses/Orbits: Negative. Other: None. IMPRESSION: 1. No acute intracranial abnormality identified. 2. Moderate chronic microvascular ischemic changes and volume loss of the brain. Electronically Signed   By: Kristine Garbe M.D.   On: 08/24/2018 03:37   Dg Chest Portable 1 View  Result Date: 08/24/2018 CLINICAL DATA:  85 year old female with altered mental status. EXAM: PORTABLE CHEST 1 VIEW COMPARISON:  06/15/2018 and earlier. FINDINGS: Portable AP upright view at 0009 hours. Mildly lower lung volumes. Stable mild tortuosity of the thoracic aorta. Other mediastinal contours are within normal limits. Visualized tracheal air column is within normal limits. Allowing for portable technique the lungs are clear. Chronic cervical ACDF. Paucity bowel gas in the upper abdomen. IMPRESSION: No acute cardiopulmonary abnormality. Electronically Signed   By: Genevie Ann M.D.   On: 08/24/2018 01:04    EKG: Orders placed or performed during the hospital encounter of 08/23/18  . EKG 12-Lead  . EKG 12-Lead    IMPRESSION AND PLAN: 85 year old female patient with a known history of COPD, type 2 diabetes mellitus, breast cancer, hypertension presented to the emergency room for confusion.   -Acute encephalopathy Versus TIA Admit patient to medical floor observation bed Start patient on oral aspirin Neurology consult Check carotid ultrasound and echocardiogram Physical therapy and speech therapy evaluation  -COPD Stable Continue nebulizer therapy  -Type 2  diabetes mellitus Sliding scale coverage with insulin  - Hyperlipidemia Continue statin medication  -Hypertension Start oral metoprolol  -DVT prophylaxis subcu Lovenox daily  All the records are reviewed and case discussed with ED provider. Management  plans discussed with the patient, family and they are in agreement.  CODE STATUS:DNR Code Status History    Date Active Date Inactive Code Status Order ID Comments User Context   05/20/2018 1219 05/21/2018 1834 DNR [id number]  Dustin Flock, MD Inpatient   05/20/2018 0006 05/20/2018 1219 Full Code [id number]  Lance Coon, MD Inpatient    Questions for Most Recent Historical Code Status (Order [id number])    Question Answer Comment   In the event of cardiac or respiratory ARREST Do not call a "code blue"    In the event of cardiac or respiratory ARREST Do not perform Intubation, CPR, defibrillation or ACLS    In the event of cardiac or respiratory ARREST Use medication by any route, position, wound care, and other measures to relive pain and suffering. May use oxygen, suction and manual treatment of airway obstruction as needed for comfort.        TOTAL TIME TAKING CARE OF THIS PATIENT: 53 minutes.    Saundra Shelling M.D on 08/24/2018 at 10:17 AM  Between 7am to 6pm - Pager - [phone]  After 6pm go to [email or web address] - password [email or web address] [facility]  Office  [phone]  CC: Primary care physician; Dion Body, MD

## 2018-08-24 NOTE — ED Notes (Signed)
PT is having slurred speech at this time. Per previous nurse, pt will speak well, and then have episodes where she is garbled. Upon this RN taking over, pt was keenly responsive and able to communicate minor needs such as using the restroom, with some aphasia. Should be noted that pt was just awakened from a nap.

## 2018-08-24 NOTE — ED Notes (Signed)
PT's daughter updated, pt on phone with daughter at this time

## 2018-08-24 NOTE — ED Notes (Signed)
ED TO INPATIENT HANDOFF REPORT  ED Nurse Name and Phone #: Kellin Bartling [phone]  S Name/Age/Gender Alexa Lowe 85 y.o. female Room/Bed: ED10A/ED10A  Code Status   Code Status: Prior  Home/SNF/Other Home Patient oriented to: self Is this baseline? unknown  Triage Complete: Triage complete  Chief Complaint altered mental status  Triage Note Patient brought in by ems from home for altered mental status. Per ems patient called ems and patient's last known normal was 12 hours prior to arrival. Patient alert and oriented times two at this time.    Allergies Allergies  Allergen Reactions  . Morphine And Related Other (See Comments)    Reaction:  Hallucinations   . Codeine Nausea And Vomiting    Level of Care/Admitting Diagnosis ED Disposition    ED Disposition Condition Comment   Admit  Hospital Area: [facility] [[id number]]  Level of Care: Med-Surg [16]  Covid Evaluation: N/A  Diagnosis: TIA (transient ischemic attack) [665993]  Admitting Physician: Saundra Shelling [[id number]]  Attending Physician: Saundra Shelling [[id number]]  PT Class (Do Not Modify): Observation [104]  PT Acc Code (Do Not Modify): Observation [10022]       B Medical/Surgery History Past Medical History:  Diagnosis Date  . Cancer [facility])    Right breast  . COPD (chronic obstructive pulmonary disease) ([place])   . Diabetes mellitus without complication ([place])   . Hypertension    Past Surgical History:  Procedure Laterality Date  . ABDOMINAL SURGERY    . APPENDECTOMY    . BACK SURGERY    . BREAST SURGERY    . CHOLECYSTECTOMY    . ESOPHAGOGASTRODUODENOSCOPY (EGD) WITH PROPOFOL N/A 05/20/2018   Procedure: ESOPHAGOGASTRODUODENOSCOPY (EGD) WITH PROPOFOL;  Surgeon: Toledo, Benay Pike, MD;  Location: [facility] ENDOSCOPY;  Service: Gastroenterology;  Laterality: N/A;     A IV Location/Drains/Wounds Patient Lines/Drains/Airways Status   Active Line/Drains/Airways    Name:   Placement date:    Placement time:   Site:   Days:   Peripheral IV 08/24/18 Right;Anterior Forearm   08/24/18    0435    Forearm   less than 1   Airway   05/20/18    1324     96          Intake/Output Last 24 hours  Intake/Output Summary (Last 24 hours) at 08/24/2018 1420 Last data filed at 08/24/2018 0620 Gross per 24 hour  Intake 500 ml  Output -  Net 500 ml    Labs/Imaging Results for orders placed or performed during the hospital encounter of 08/23/18 (from the past 48 hour(s))  Comprehensive metabolic panel     Status: Abnormal   Collection Time: 08/23/18 10:08 PM  Result Value Ref Range   Sodium 141 135 - 145 mmol/L   Potassium 3.8 3.5 - 5.1 mmol/L   Chloride 107 98 - 111 mmol/L   CO2 22 22 - 32 mmol/L   Glucose, Bld 183 (H) 70 - 99 mg/dL   BUN 19 8 - 23 mg/dL   Creatinine, Ser 1.11 (H) 0.44 - 1.00 mg/dL   Calcium 9.3 8.9 - 10.3 mg/dL   Total Protein 6.9 6.5 - 8.1 g/dL   Albumin 3.8 3.5 - 5.0 g/dL   AST 21 15 - 41 U/L   ALT 11 0 - 44 U/L   Alkaline Phosphatase 83 38 - 126 U/L   Total Bilirubin 0.6 0.3 - 1.2 mg/dL   GFR calc non Af Amer 45 (L) >60 mL/min   GFR calc  Af Amer 52 (L) >60 mL/min   Anion gap 12 5 - 15    Comment: Performed at [facility], [address] [place], [place] [address]  CBC     Status: Abnormal   Collection Time: 08/23/18 10:08 PM  Result Value Ref Range   WBC 11.2 (H) 4.0 - 10.5 K/uL   RBC 4.32 3.87 - 5.11 MIL/uL   Hemoglobin 11.7 (L) 12.0 - 15.0 g/dL   HCT 39.4 36.0 - 46.0 %   MCV 91.2 80.0 - 100.0 fL   MCH 27.1 26.0 - 34.0 pg   MCHC 29.7 (L) 30.0 - 36.0 g/dL   RDW 17.2 (H) 11.5 - 15.5 %   Platelets 237 150 - 400 K/uL   nRBC 0.0 0.0 - 0.2 %    Comment: Performed at [facility], [address] [place], [place] [address]  Urinalysis, Complete w Microscopic     Status: Abnormal   Collection Time: 08/24/18  4:21 AM  Result Value Ref Range   Color, Urine YELLOW (A) YELLOW   APPearance CLEAR (A) CLEAR   Specific Gravity, Urine  1.020 1.005 - 1.030   pH 5.0 5.0 - 8.0   Glucose, UA NEGATIVE NEGATIVE mg/dL   Hgb urine dipstick NEGATIVE NEGATIVE   Bilirubin Urine NEGATIVE NEGATIVE   Ketones, ur NEGATIVE NEGATIVE mg/dL   Protein, ur NEGATIVE NEGATIVE mg/dL   Nitrite NEGATIVE NEGATIVE   Leukocytes,Ua NEGATIVE NEGATIVE   RBC / HPF 0-5 0 - 5 RBC/hpf   WBC, UA 0-5 0 - 5 WBC/hpf   Bacteria, UA NONE SEEN NONE SEEN   Squamous Epithelial / LPF 0-5 0 - 5   Mucus PRESENT    Hyaline Casts, UA PRESENT     Comment: Performed at [facility], [address] [place], [place] [address]  Glucose, capillary     Status: Abnormal   Collection Time: 08/24/18  6:51 AM  Result Value Ref Range   Glucose-Capillary 107 (H) 70 - 99 mg/dL  SARS Coronavirus 2 (CEPHEID - Performed in [place] hospital lab), Hosp Order     Status: None   Collection Time: 08/24/18  8:35 AM  Result Value Ref Range   SARS Coronavirus 2 NEGATIVE NEGATIVE    Comment: (NOTE) If result is NEGATIVE SARS-CoV-2 target nucleic acids are NOT DETECTED. The SARS-CoV-2 RNA is generally detectable in upper and lower  respiratory specimens during the acute phase of infection. The lowest  concentration of SARS-CoV-2 viral copies this assay can detect is 250  copies / mL. A negative result does not preclude SARS-CoV-2 infection  and should not be used as the sole basis for treatment or other  patient management decisions.  A negative result may occur with  improper specimen collection / handling, submission of specimen other  than nasopharyngeal swab, presence of viral mutation(s) within the  areas targeted by this assay, and inadequate number of viral copies  (<250 copies / mL). A negative result must be combined with clinical  observations, patient history, and epidemiological information. If result is POSITIVE SARS-CoV-2 target nucleic acids are DETECTED. The SARS-CoV-2 RNA is generally detectable in upper and lower  respiratory specimens dur ing the  acute phase of infection.  Positive  results are indicative of active infection with SARS-CoV-2.  Clinical  correlation with patient history and other diagnostic information is  necessary to determine patient infection status.  Positive results do  not rule out bacterial infection or co-infection with other viruses. If result  is PRESUMPTIVE POSTIVE SARS-CoV-2 nucleic acids MAY BE PRESENT.   A presumptive positive result was obtained on the submitted specimen  and confirmed on repeat testing.  While 2019 novel coronavirus  (SARS-CoV-2) nucleic acids may be present in the submitted sample  additional confirmatory testing may be necessary for epidemiological  and / or clinical management purposes  to differentiate between  SARS-CoV-2 and other Sarbecovirus currently known to infect humans.  If clinically indicated additional testing with an alternate test  methodology [phone]) is advised. The SARS-CoV-2 RNA is generally  detectable in upper and lower respiratory sp ecimens during the acute  phase of infection. The expected result is Negative. Fact Sheet for Patients:  StrictlyIdeas.no Fact Sheet for Healthcare Providers: BankingDealers.co.za This test is not yet approved or cleared by the [place] FDA and has been authorized for detection and/or diagnosis of SARS-CoV-2 by FDA under an Emergency Use Authorization (EUA).  This EUA will remain in effect (meaning this test can be used) for the duration of the COVID-19 declaration under Section 564(b)(1) of the Act, 21 U.S.C. section 360bbb-3(b)(1), unless the authorization is terminated or revoked sooner. Performed at [facility], [place], [place] [address]   Glucose, capillary     Status: Abnormal   Collection Time: 08/24/18 11:54 AM  Result Value Ref Range   Glucose-Capillary 118 (H) 70 - 99 mg/dL   Ct Head Wo Contrast  Result Date: 08/23/2018 CLINICAL  DATA:  Confusion EXAM: CT HEAD WITHOUT CONTRAST TECHNIQUE: Contiguous axial images were obtained from the base of the skull through the vertex without intravenous contrast. COMPARISON:  10/28/2014 FINDINGS: Brain: No evidence of acute infarction, hemorrhage, hydrocephalus, extra-axial collection or mass lesion/mass effect. Periventricular white matter hypodensity. Vascular: No hyperdense vessel or unexpected calcification. Skull: Normal. Negative for fracture or focal lesion. Sinuses/Orbits: No acute finding. Other: None. IMPRESSION: No acute intracranial pathology.  Small-vessel white matter disease. Electronically Signed   By: Eddie Candle M.D.   On: 08/23/2018 22:35   Mr Brain Wo Contrast  Result Date: 08/24/2018 CLINICAL DATA:  85 y/o F; altered mental status. Last known normal 12 hours prior to arrival. EXAM: MRI HEAD WITHOUT CONTRAST TECHNIQUE: Multiplanar, multiecho pulse sequences of the brain and surrounding structures were obtained without intravenous contrast. COMPARISON:  08/23/2018 CT head. FINDINGS: Brain: No acute infarction, hemorrhage, hydrocephalus, extra-axial collection or mass lesion. Early confluent nonspecific T2 FLAIR hyperintensities in subcortical and periventricular white matter as well as the pons are compatible with moderate chronic microvascular ischemic changes. Moderate volume loss of the brain. Vascular: Normal flow voids. Skull and upper cervical spine: Normal marrow signal. Sinuses/Orbits: Negative. Other: None. IMPRESSION: 1. No acute intracranial abnormality identified. 2. Moderate chronic microvascular ischemic changes and volume loss of the brain. Electronically Signed   By: Kristine Garbe M.D.   On: 08/24/2018 03:37   Dg Chest Portable 1 View  Result Date: 08/24/2018 CLINICAL DATA:  85 year old female with altered mental status. EXAM: PORTABLE CHEST 1 VIEW COMPARISON:  06/15/2018 and earlier. FINDINGS: Portable AP upright view at 0009 hours. Mildly lower lung  volumes. Stable mild tortuosity of the thoracic aorta. Other mediastinal contours are within normal limits. Visualized tracheal air column is within normal limits. Allowing for portable technique the lungs are clear. Chronic cervical ACDF. Paucity bowel gas in the upper abdomen. IMPRESSION: No acute cardiopulmonary abnormality. Electronically Signed   By: Genevie Ann M.D.   On: 08/24/2018 01:04    Pending Labs Unresulted Labs (From admission, onward)  Start     Ordered   Signed and Held  Lipid panel  Tomorrow morning,   R    Comments:  Fasting    Signed and Held   Signed and Held  CBC  (enoxaparin (LOVENOX)    CrCl >/= 30 ml/min)  Once,   R    Comments:  Baseline for enoxaparin therapy IF NOT ALREADY DRAWN.  Notify MD if PLT < 100 K.    Signed and Held   Signed and Held  Creatinine, serum  (enoxaparin (LOVENOX)    CrCl >/= 30 ml/min)  Once,   R    Comments:  Baseline for enoxaparin therapy IF NOT ALREADY DRAWN.    Signed and Held   Signed and Held  Creatinine, serum  (enoxaparin (LOVENOX)    CrCl >/= 30 ml/min)  Weekly,   R    Comments:  while on enoxaparin therapy    Signed and Held          Vitals/Pain Today's Vitals   08/24/18 0700 08/24/18 0830 08/24/18 1152 08/24/18 1248  BP: (!) 161/76 (!) 186/69  (!) 142/72  Pulse: 62 (!) 103 (!) 58 (!) 58  Resp:  20 18 17   Temp:      TempSrc:      SpO2: 97% 96% 96% 95%  Weight:      Height:      PainSc:        Isolation Precautions No active isolations  Medications Medications  metoprolol tartrate (LOPRESSOR) tablet 25 mg (0 mg Oral Hold 08/24/18 1155)  insulin aspart (novoLOG) injection 0-9 Units (0 Units Subcutaneous Not Given 08/24/18 1155)  insulin aspart (novoLOG) injection 0-5 Units (has no administration in time range)  sodium chloride 0.9 % bolus 500 mL (0 mLs Intravenous Stopped 08/24/18 0620)  aspirin chewable tablet 324 mg (324 mg Oral Given 08/24/18 5009)    Mobility walks with person assist     Focused  Assessments Neuro Assessment Handoff:  Swallow screen pass? Yes  Cardiac Rhythm: Normal sinus rhythm NIH Stroke Scale ( + Modified Stroke Scale Criteria)  Interval: Initial Level of Consciousness (1a.)   : Not alert, but arousable by minor stimulation to obey, answer, or respond LOC Questions (1b. )   +: Answers neither question correctly LOC Commands (1c. )   + : Performs both tasks correctly Best Gaze (2. )  +: Normal Visual (3. )  +: No visual loss Facial Palsy (4. )    : Minor paralysis Motor Arm, Left (5a. )   +: No drift Motor Arm, Right (5b. )   +: No drift Motor Leg, Left (6a. )   +: No drift Motor Leg, Right (6b. )   +: No drift Limb Ataxia (7. ): Absent Sensory (8. )   +: Normal, no sensory loss Best Language (9. )   +: Severe aphasia Dysarthria (10. ): Severe dysarthria, patient's speech is so slurred as to be unintelligible in the absence of or out of proportion to any dysphasia, or is mute/anarthric Extinction/Inattention (11.)   +: No Abnormality Modified SS Total  +: 4 Complete NIHSS TOTAL: 8     Neuro Assessment: Exceptions to WDL Neuro Checks:   Initial (08/24/18 1255)  Last Documented NIHSS Modified Score: 4 (08/24/18 1255) Has TPA been given? No If patient is a Neuro Trauma and patient is going to OR before floor call report to [place] nurse: [phone] or [phone]     R Recommendations: See Admitting Provider Note  Report given to:   Additional Notes:

## 2018-08-24 NOTE — ED Notes (Signed)
Pt easily awakened; repositioned for comfort-HOB lowered and knees elevated; pt appreciative; water at bedside;

## 2018-08-24 NOTE — ED Notes (Signed)
Report received, care assumed; pt is in MRI at this time

## 2018-08-24 NOTE — Care Management Obs Status (Signed)
[facility] NOTIFICATION   Patient Details  Name: Alexa Lowe MRN: [id number] Date of Birth: 09/16/1930   Medicare Observation Status Notification Given:  Yes Reviewed with patient's daughter and copy emailed to her.     Shelbie Hutching, RN 08/24/2018, 1:42 PM

## 2018-08-24 NOTE — ED Notes (Addendum)
Went in several minutes ago to readjust blood pressure cuff; pt had clear speech and was able to ask me to lower the head of the bed; returned to room after it was noted BP didn't take; pt with jumbled speech, having a very hard time understanding her; MD at bedside; decision made to admit pt; pt weak;

## 2018-08-24 NOTE — Consult Note (Signed)
Reason for Consult:AMS Referring Physician: Pyreddy  CC: AMS  HPI: Alexa Lowe is an 85 y.o. female who lives at home alone.  Unable to provide any history due to mental status but per daughter EMS was called on yesterday by neighbor.  Patient [place] and had two sharp right side pains in her head.  Returned home and became confused.  Was unable to use her wheelchair and had difficulty with her phone.  Did eventually call her daughter and speech was incomprehensible at times but would clear up before recurring again.  Patient remained confused all day and neighbor called EMS. At baseline patient is wheelchair bound for the most part but lives in a disabled apartment.  Is unable to pay her own bills due to tremor and has a history of overdrawing her account frequently but has not done that recently.  Before her husband died about 58 years ago he reported that she was diagnosed with early onset dementia but the family has not noticed this to be progressive.    Past Medical History:  Diagnosis Date  . Cancer [facility])    Right breast  . COPD (chronic obstructive pulmonary disease) ([place])   . Diabetes mellitus without complication ([place])   . Hypertension     Past Surgical History:  Procedure Laterality Date  . ABDOMINAL SURGERY    . APPENDECTOMY    . BACK SURGERY    . BREAST SURGERY    . CHOLECYSTECTOMY    . ESOPHAGOGASTRODUODENOSCOPY (EGD) WITH PROPOFOL N/A 05/20/2018   Procedure: ESOPHAGOGASTRODUODENOSCOPY (EGD) WITH PROPOFOL;  Surgeon: Toledo, Benay Pike, MD;  Location: [facility] ENDOSCOPY;  Service: Gastroenterology;  Laterality: N/A;    No family history on file.  Social History:  reports that she has quit smoking. She has never used smokeless tobacco. She reports that she does not drink alcohol or use drugs.  Allergies  Allergen Reactions  . Morphine And Related Other (See Comments)    Reaction:  Hallucinations   . Codeine Nausea And Vomiting    Medications:  I  have reviewed the patient's current medications. Prior to Admission:  Medications Prior to Admission  Medication Sig Dispense Refill Last Dose  . acetaminophen (TYLENOL) 500 MG tablet Take 1,000 mg by mouth every 8 (eight) hours as needed for mild pain or moderate pain.   unknown at unknown  . albuterol (PROVENTIL HFA;VENTOLIN HFA) 108 (90 Base) MCG/ACT inhaler Inhale 2 puffs into the lungs every 4 (four) hours as needed for wheezing or shortness of breath.   unknown at unknown  . aspirin EC 325 MG tablet Take 325 mg by mouth daily.   unknown at unknown  . atorvastatin (LIPITOR) 40 MG tablet Take 40 mg by mouth at bedtime.    unknown at unknown  . celecoxib (CELEBREX) 200 MG capsule Take 200 mg by mouth 2 (two) times daily.    unknown at unknown  . citalopram (CELEXA) 20 MG tablet Take 30 mg by mouth daily.    unknown at unknown  . fluconazole (DIFLUCAN) 150 MG tablet Take 150 mg by mouth once. (may repeat in 7 days if needed)   unknown at unknown  . gabapentin (NEURONTIN) 100 MG capsule Take 100 mg by mouth at bedtime as needed for tremors.   unknown at unknown  . gabapentin (NEURONTIN) 300 MG capsule Take 300 mg by mouth 3 (three) times daily.    unknown at unknown  . Melatonin 5 MG TABS Take 10 mg by mouth at  bedtime.   unknown at unknown  . metFORMIN (GLUCOPHAGE) 500 MG tablet Take 500 mg by mouth 2 (two) times daily.    unknown at unknown  . nystatin ointment (MYCOSTATIN) Apply 1 application topically 2 (two) times a day.   unknown at unknown  . pantoprazole (PROTONIX) 40 MG tablet Take 1 tablet (40 mg total) by mouth 2 (two) times daily. 30 tablet 1 unknown at unknown  . sucralfate (CARAFATE) 1 g tablet Take 1 g by mouth 4 (four) times daily.   unknown at unknown  . ondansetron (ZOFRAN) 4 MG tablet Take 1 tablet (4 mg total) by mouth every 8 (eight) hours as needed for nausea or vomiting. (Patient not taking: Reported on 08/24/2018) 8 tablet 0 Not Taking at Unknown time  . sucralfate  (CARAFATE) 1 GM/10ML suspension Take 10 mLs (1 g total) by mouth 4 (four) times daily -  with meals and at bedtime for 30 days. (Patient not taking: Reported on 08/24/2018) 1200 mL 0 Not Taking at Unknown time   Scheduled: .  stroke: mapping our early stages of recovery book   Does not apply Once  . aspirin  300 mg Rectal Daily   Or  . aspirin  325 mg Oral Daily  . atorvastatin  40 mg Oral QHS  . citalopram  30 mg Oral Daily  . enoxaparin (LOVENOX) injection  40 mg Subcutaneous Q24H  . gabapentin  300 mg Oral TID  . insulin aspart  0-5 Units Subcutaneous QHS  . insulin aspart  0-9 Units Subcutaneous TID WC  . Melatonin  10 mg Oral QHS  . metoprolol tartrate  25 mg Oral BID  . nystatin ointment  1 application Topical BID  . pantoprazole  40 mg Oral BID  . sucralfate  1 g Oral QID    ROS: Unable to provide due to mental status  Physical Examination: Blood pressure (!) 141/83, pulse 72, temperature 98.4 F (36.9 C), temperature source Oral, resp. rate 16, height '5\' 3"'  (1.6 m), weight 90.7 kg, SpO2 96 %.  HEENT-  Normocephalic, no lesions, without obvious abnormality.  Normal external eye and conjunctiva.  Normal TM's bilaterally.  Normal auditory canals and external ears. Normal external nose, mucus membranes and septum.  Normal pharynx. Cardiovascular- S1, S2 normal, pulses palpable throughout   Lungs- chest clear, no wheezing, rales, normal symmetric air entry Abdomen- soft, non-tender; bowel sounds normal; no masses,  no organomegaly Extremities- mild LE edema Lymph-no adenopathy palpable Musculoskeletal-no joint tenderness, deformity or swelling Skin-warm and dry, no hyperpigmentation, vitiligo, or suspicious lesions  Neurological Examination   Mental Status: Alert.  Reports that she is in her sister's house.  Will not answer questions otherwise for orientation.  Repeats that she is doing fine and does not know why everyone is bothering her.  Aphasic at times with  incomprehensible speech.  Follows some simple commands.   Cranial Nerves: II: Does not blink to confrontation from the right III,IV, VI: Extra-ocular motions intact bilaterally V,VII: No facial asymmetry noted VIII: hearing normal bilaterally IX,X: gag reflex present XI: bilateral shoulder shrug XII: midline tongue extension Motor: Patient moves all extremities against gravity but will not cooperate for formal testing Sensory: Responds to light noxious stimuli throughout Deep Tendon Reflexes: Symmetric throughout Plantars: Right: mute   Left: mute Cerebellar: Finger-to-nose testing intact bilaterally Gait: not tested due to patient being wheelchair bound    Laboratory Studies:   Basic Metabolic Panel: Recent Labs  Lab 08/23/18 2208  NA 141  K 3.8  CL 107  CO2 22  GLUCOSE 183*  BUN 19  CREATININE 1.11*  CALCIUM 9.3    Liver Function Tests: Recent Labs  Lab 08/23/18 2208  AST 21  ALT 11  ALKPHOS 83  BILITOT 0.6  PROT 6.9  ALBUMIN 3.8   No results for input(s): LIPASE, AMYLASE in the last 168 hours. No results for input(s): AMMONIA in the last 168 hours.  CBC: Recent Labs  Lab 08/23/18 2208  WBC 11.2*  HGB 11.7*  HCT 39.4  MCV 91.2  PLT 237    Cardiac Enzymes: No results for input(s): CKTOTAL, CKMB, CKMBINDEX, TROPONINI in the last 168 hours.  BNP: Invalid input(s): POCBNP  CBG: Recent Labs  Lab 08/24/18 0651 08/24/18 1154  GLUCAP 107* 118*    Microbiology: Results for orders placed or performed during the hospital encounter of 08/23/18  SARS Coronavirus 2 (CEPHEID - Performed in [place] hospital lab), Hosp Order     Status: None   Collection Time: 08/24/18  8:35 AM  Result Value Ref Range Status   SARS Coronavirus 2 NEGATIVE NEGATIVE Final    Comment: (NOTE) If result is NEGATIVE SARS-CoV-2 target nucleic acids are NOT DETECTED. The SARS-CoV-2 RNA is generally detectable in upper and lower  respiratory specimens during the acute  phase of infection. The lowest  concentration of SARS-CoV-2 viral copies this assay can detect is 250  copies / mL. A negative result does not preclude SARS-CoV-2 infection  and should not be used as the sole basis for treatment or other  patient management decisions.  A negative result may occur with  improper specimen collection / handling, submission of specimen other  than nasopharyngeal swab, presence of viral mutation(s) within the  areas targeted by this assay, and inadequate number of viral copies  (<250 copies / mL). A negative result must be combined with clinical  observations, patient history, and epidemiological information. If result is POSITIVE SARS-CoV-2 target nucleic acids are DETECTED. The SARS-CoV-2 RNA is generally detectable in upper and lower  respiratory specimens dur ing the acute phase of infection.  Positive  results are indicative of active infection with SARS-CoV-2.  Clinical  correlation with patient history and other diagnostic information is  necessary to determine patient infection status.  Positive results do  not rule out bacterial infection or co-infection with other viruses. If result is PRESUMPTIVE POSTIVE SARS-CoV-2 nucleic acids MAY BE PRESENT.   A presumptive positive result was obtained on the submitted specimen  and confirmed on repeat testing.  While 2019 novel coronavirus  (SARS-CoV-2) nucleic acids may be present in the submitted sample  additional confirmatory testing may be necessary for epidemiological  and / or clinical management purposes  to differentiate between  SARS-CoV-2 and other Sarbecovirus currently known to infect humans.  If clinically indicated additional testing with an alternate test  methodology [phone]) is advised. The SARS-CoV-2 RNA is generally  detectable in upper and lower respiratory sp ecimens during the acute  phase of infection. The expected result is Negative. Fact Sheet for Patients:   StrictlyIdeas.no Fact Sheet for Healthcare Providers: BankingDealers.co.za This test is not yet approved or cleared by the [place] FDA and has been authorized for detection and/or diagnosis of SARS-CoV-2 by FDA under an Emergency Use Authorization (EUA).  This EUA will remain in effect (meaning this test can be used) for the duration of the COVID-19 declaration under Section 564(b)(1) of the Act, 21 U.S.C. section 360bbb-3(b)(1), unless the authorization is terminated  or revoked sooner. Performed at [facility], [address] [place], [place] [address]     Coagulation Studies: No results for input(s): LABPROT, INR in the last 72 hours.  Urinalysis:  Recent Labs  Lab 08/24/18 0421  COLORURINE YELLOW*  LABSPEC 1.020  PHURINE 5.0  GLUCOSEU NEGATIVE  HGBUR NEGATIVE  BILIRUBINUR NEGATIVE  KETONESUR NEGATIVE  PROTEINUR NEGATIVE  NITRITE NEGATIVE  LEUKOCYTESUR NEGATIVE    Lipid Panel:     Component Value Date/Time   CHOL 133 06/29/2011 0648   TRIG 130 06/29/2011 0648   HDL 44 06/29/2011 0648   VLDL 26 06/29/2011 0648   LDLCALC 63 06/29/2011 0648    HgbA1C: No results found for: HGBA1C  Urine Drug Screen:  No results found for: LABOPIA, COCAINSCRNUR, LABBENZ, AMPHETMU, THCU, LABBARB  Alcohol Level: No results for input(s): ETH in the last 168 hours.  Other results: EKG: sinus rhythm at 72 bpm with PAC's  Imaging: Ct Head Wo Contrast  Result Date: 08/23/2018 CLINICAL DATA:  Confusion EXAM: CT HEAD WITHOUT CONTRAST TECHNIQUE: Contiguous axial images were obtained from the base of the skull through the vertex without intravenous contrast. COMPARISON:  10/28/2014 FINDINGS: Brain: No evidence of acute infarction, hemorrhage, hydrocephalus, extra-axial collection or mass lesion/mass effect. Periventricular white matter hypodensity. Vascular: No hyperdense vessel or unexpected calcification. Skull: Normal.  Negative for fracture or focal lesion. Sinuses/Orbits: No acute finding. Other: None. IMPRESSION: No acute intracranial pathology.  Small-vessel white matter disease. Electronically Signed   By: Eddie Candle M.D.   On: 08/23/2018 22:35   Mr Brain Wo Contrast  Result Date: 08/24/2018 CLINICAL DATA:  85 y/o F; altered mental status. Last known normal 12 hours prior to arrival. EXAM: MRI HEAD WITHOUT CONTRAST TECHNIQUE: Multiplanar, multiecho pulse sequences of the brain and surrounding structures were obtained without intravenous contrast. COMPARISON:  08/23/2018 CT head. FINDINGS: Brain: No acute infarction, hemorrhage, hydrocephalus, extra-axial collection or mass lesion. Early confluent nonspecific T2 FLAIR hyperintensities in subcortical and periventricular white matter as well as the pons are compatible with moderate chronic microvascular ischemic changes. Moderate volume loss of the brain. Vascular: Normal flow voids. Skull and upper cervical spine: Normal marrow signal. Sinuses/Orbits: Negative. Other: None. IMPRESSION: 1. No acute intracranial abnormality identified. 2. Moderate chronic microvascular ischemic changes and volume loss of the brain. Electronically Signed   By: Kristine Garbe M.D.   On: 08/24/2018 03:37   Dg Chest Portable 1 View  Result Date: 08/24/2018 CLINICAL DATA:  85 year old female with altered mental status. EXAM: PORTABLE CHEST 1 VIEW COMPARISON:  06/15/2018 and earlier. FINDINGS: Portable AP upright view at 0009 hours. Mildly lower lung volumes. Stable mild tortuosity of the thoracic aorta. Other mediastinal contours are within normal limits. Visualized tracheal air column is within normal limits. Allowing for portable technique the lungs are clear. Chronic cervical ACDF. Paucity bowel gas in the upper abdomen. IMPRESSION: No acute cardiopulmonary abnormality. Electronically Signed   By: Genevie Ann M.D.   On: 08/24/2018 01:04     Assessment/Plan: 85 year old female  presenting with mental status changes.  Unclear etiology.  Blood work fairly unremarkable.  MRI of the brain reviewed and shows no acute changes.  BP normal.  Neurological examination significant for aphasia and right visual field deficits.  Further work up recommended.    Recommendations: 1. TSH, ESR, B12, B1 2. EEG 3. Frequent neuro checks 4. Telemetry 5. Liberal BP control 6. Continue ASA daily  Alexis Goodell, MD Neurology [phone] 08/24/2018, 3:29 PM

## 2018-08-24 NOTE — TOC Initial Note (Signed)
Transition of Care [facility]) - Initial/Assessment Note    Patient Details  Name: Alexa Lowe MRN: [id number] Date of Birth: 1930-05-03  Transition of [facility]) CM/SW Contact:    Alexa Hutching, RN Phone Number: 08/24/2018, 1:46 PM  Clinical Narrative:                 Patient is being placed in observation for altered mental status, possible TIA.  RNCM spoke with patient's daughter Alexa Lowe who lives in [place] but who reports she has [place].  Daughter reports that she is worried that her mother should not live alone any longer.  Patient currently lives in [place] apartments specifically for elderly.  Patient is pretty much wheelchair bound and only stands to transfer from wheelchair to another chair, or wheelchair to shower chair.  Daughter reports that the patient has good neighbors and they help her with grocery shopping and transportation and keeping an eye on her.  Patient does not qualify for Medicaid because her income is a little over.  Daughter does not believe that she can care for her in her home and that the patient would not want to come all the way to [place].  RNCM will cont to follow and assist with discharge needs.   Expected Discharge Plan: Skilled Nursing Facility Barriers to Discharge: Continued Medical Work up   Patient Goals and CMS Choice Patient states their goals for this hospitalization and ongoing recovery are:: Patient is confused- daughter is concerned that patient cannot take care of herself and is not safe to live alone.      Expected Discharge Plan and Services Expected Discharge Plan: [place]   Discharge Planning Services: CM Consult   Living arrangements for the past 2 months: Apartment                                      Prior Living Arrangements/Services Living arrangements for the past 2 months: Apartment Lives with:: Self Patient language and need for interpreter reviewed:: No        Need for Family Participation in Patient  Care: Yes (Comment)(pt is elderly and has trouble caring for herself) Care giver support system in place?: Yes (comment)(daughter and son)   Criminal Activity/Legal Involvement Pertinent to Current Situation/Hospitalization: No - Comment as needed  Activities of Daily Living      Permission Sought/Granted                  Emotional Assessment       Orientation: : Oriented to Self Alcohol / Substance Use: Not Applicable Psych Involvement: No (comment)  Admission diagnosis:  altered mental status Patient Active Problem List   Diagnosis Date Noted  . TIA (transient ischemic attack) 08/24/2018  . Symptomatic anemia 05/19/2018  . HTN (hypertension) 05/19/2018  . Diabetes ([place]) 05/19/2018  . COPD (chronic obstructive pulmonary disease) ([place]) 05/19/2018   PCP:  Dion Body, MD Pharmacy:   [facility] [place], [place] [address] [address] Phone: [phone] Fax: [facility] [place], [place] [address] Phone: [phone] Fax: [phone]  [id number] - [place], [place] - [address] [place] [address] [address] Phone: [phone] Fax: [phone]     Social Determinants of Health (SDOH) Interventions    Readmission  Risk Interventions No flowsheet data found.

## 2018-08-24 NOTE — ED Provider Notes (Signed)
[facility] Emergency Department Provider Note   ____________________________________________   First MD Initiated Contact with Patient 08/23/18 2355     (approximate)  I have reviewed the triage vital signs and the nursing notes.   HISTORY  Chief Complaint Altered Mental Status  EM caveat: Some confusion, patient cannot recall events too well today  HPI Alexa Lowe is a 85 y.o. female here for evaluation for having episode of some confusion  Earlier today, patient placed a call to her daughter reporting she is felt a little off hard to describe as though the world was moving away from her.  Patient reports she felt like a strange feeling like her body was leaving her.  She cannot really describe it well.  She feels better now but just feels a little confused like she just cannot recall things correctly   Patient reports he just feels like she cannot remember things well.  Is not exactly sure when it started but feels like it started somewhat suddenly earlier today around noon time  Daughter reports patient called reporting confusion to her and seemed like her speech was a little bit off or she seems slightly confused.  Daughter also questions she might have some urinary tract infection and because this  Past Medical History:  Diagnosis Date   Cancer [facility])    Right breast   COPD (chronic obstructive pulmonary disease) ([place])    Diabetes mellitus without complication ([place])    Hypertension     Patient Active Problem List   Diagnosis Date Noted   Symptomatic anemia 05/19/2018   HTN (hypertension) 05/19/2018   Diabetes ([place]) 05/19/2018   COPD (chronic obstructive pulmonary disease) ([place]) 05/19/2018    Past Surgical History:  Procedure Laterality Date   ABDOMINAL SURGERY     APPENDECTOMY     BACK SURGERY     BREAST SURGERY     CHOLECYSTECTOMY     ESOPHAGOGASTRODUODENOSCOPY (EGD) WITH PROPOFOL N/A 05/20/2018   Procedure:  ESOPHAGOGASTRODUODENOSCOPY (EGD) WITH PROPOFOL;  Surgeon: Toledo, Benay Pike, MD;  Location: [facility] ENDOSCOPY;  Service: Gastroenterology;  Laterality: N/A;    Prior to Admission medications   Medication Sig Start Date End Date Taking? Authorizing Provider  acetaminophen (TYLENOL) 500 MG tablet Take 1,000 mg by mouth every 8 (eight) hours as needed for mild pain or moderate pain.   Yes [provider]  albuterol (PROVENTIL HFA;VENTOLIN HFA) 108 (90 Base) MCG/ACT inhaler Inhale 2 puffs into the lungs every 4 (four) hours as needed for wheezing or shortness of breath. 02/26/17  Yes [provider]  aspirin EC 325 MG tablet Take 325 mg by mouth daily.   Yes [provider]  atorvastatin (LIPITOR) 40 MG tablet Take 40 mg by mouth at bedtime.    Yes [provider]  celecoxib (CELEBREX) 200 MG capsule Take 200 mg by mouth 2 (two) times daily.    Yes [provider]  citalopram (CELEXA) 20 MG tablet Take 30 mg by mouth daily.    Yes [provider]  fluconazole (DIFLUCAN) 150 MG tablet Take 150 mg by mouth once. (may repeat in 7 days if needed) 08/19/18  Yes [provider]  gabapentin (NEURONTIN) 100 MG capsule Take 100 mg by mouth at bedtime as needed for tremors. 04/13/18  Yes [provider]  gabapentin (NEURONTIN) 300 MG capsule Take 300 mg by mouth 3 (three) times daily.    Yes [provider]  metFORMIN (GLUCOPHAGE) 500 MG tablet Take 500  mg by mouth 2 (two) times daily.    Yes [provider]  nystatin ointment (MYCOSTATIN) Apply 1 application topically 2 (two) times a day. 08/19/18  Yes [provider]  pantoprazole (PROTONIX) 40 MG tablet Take 1 tablet (40 mg total) by mouth 2 (two) times daily. 06/15/18  Yes Schuyler Amor, MD  sucralfate (CARAFATE) 1 g tablet Take 1 g by mouth 4 (four) times daily. 07/17/18  Yes [provider]  ondansetron (ZOFRAN) 4 MG tablet Take 1 tablet (4 mg total) by  mouth every 8 (eight) hours as needed for nausea or vomiting. 06/15/18   Schuyler Amor, MD  sucralfate (CARAFATE) 1 GM/10ML suspension Take 10 mLs (1 g total) by mouth 4 (four) times daily -  with meals and at bedtime for 30 days. Patient not taking: Reported on 08/24/2018 06/15/18 07/15/18  Schuyler Amor, MD    Allergies Morphine and related and Codeine  No family history on file.  Social History Social History   Tobacco Use   Smoking status: Former Smoker   Smokeless tobacco: Never Used  Substance Use Topics   Alcohol use: No   Drug use: No    Review of Systems Constitutional: No fever/chills Eyes: No visual changes. ENT: No sore throat. Cardiovascular: Denies chest pain. Respiratory: Denies shortness of breath. Gastrointestinal: No abdominal pain.   Genitourinary: Noticed any problems with going to the bathroom Musculoskeletal: Negative for back pain. Skin: Negative for rash. Neurological: Negative for headaches, areas of focal weakness or numbness.  Patient reports she just does not feel right, she cannot describe it well.  For just feels a little bit off and trouble recalling things    ____________________________________________   PHYSICAL EXAM:  VITAL SIGNS: ED Triage Vitals [08/23/18 2150]  Enc Vitals Group     BP (!) 145/67     Pulse Rate 74     Resp 18     Temp 98.2 F (36.8 C)     Temp Source Oral     SpO2 95 %     Weight 200 lb (90.7 kg)     Height 5\' 3"  (1.6 m)     Head Circumference      Peak Flow      Pain Score 0     Pain Loc      Pain Edu?      Excl. in [place]?     Constitutional: Alert and oriented. Well appearing and in no acute distress. Eyes: Conjunctivae are normal. Head: Atraumatic. Nose: No congestion/rhinnorhea. Mouth/Throat: Mucous membranes are moist. Neck: No stridor.  Cardiovascular: Normal rate, regular rhythm. Grossly normal heart sounds.  Good peripheral circulation. Respiratory: Normal respiratory effort.  No  retractions. Lungs CTAB. Gastrointestinal: Soft and nontender. No distention. Musculoskeletal: No lower extremity tenderness nor edema. Neurologic:  Normal speech and language except her words do seem just a little bit confused at times, speech is clear but occasionally seems to express a little bit of difficulty and word finding. No gross focal neurologic deficits are appreciated.  Moves all extremities well.  Equal facial smile.  No pronator drift in any extremity. Skin:  Skin is warm, dry and intact. No rash noted. Psychiatric: Mood and affect are normal. Speech and behavior are normal.  ____________________________________________   LABS (all labs ordered are listed, but only abnormal results are displayed)  Labs Reviewed  COMPREHENSIVE METABOLIC PANEL - Abnormal; Notable for the following components:      Result Value   Glucose, Bld  183 (*)    Creatinine, Ser 1.11 (*)    GFR calc non Af Amer 45 (*)    GFR calc Af Amer 52 (*)    All other components within normal limits  CBC - Abnormal; Notable for the following components:   WBC 11.2 (*)    Hemoglobin 11.7 (*)    MCHC 29.7 (*)    RDW 17.2 (*)    All other components within normal limits  URINALYSIS, COMPLETE (UACMP) WITH MICROSCOPIC - Abnormal; Notable for the following components:   Color, Urine YELLOW (*)    APPearance CLEAR (*)    All other components within normal limits  GLUCOSE, CAPILLARY - Abnormal; Notable for the following components:   Glucose-Capillary 107 (*)    All other components within normal limits  CBG MONITORING, ED  CBG MONITORING, ED   ____________________________________________  EKG  Reviewed entered by me at 2205 Heart rate 70 QRS 80 QTc 430 Sinus rhythm, no evidence of acute ischemia.  Possible old anterior MI  T wave morphologies appear similar to previous from 15 June 2018, suspect limb placement is just slightly different.  I do not see evidence of acute  change ____________________________________________  RADIOLOGY  Ct Head Wo Contrast  Result Date: 08/23/2018 CLINICAL DATA:  Confusion EXAM: CT HEAD WITHOUT CONTRAST TECHNIQUE: Contiguous axial images were obtained from the base of the skull through the vertex without intravenous contrast. COMPARISON:  10/28/2014 FINDINGS: Brain: No evidence of acute infarction, hemorrhage, hydrocephalus, extra-axial collection or mass lesion/mass effect. Periventricular white matter hypodensity. Vascular: No hyperdense vessel or unexpected calcification. Skull: Normal. Negative for fracture or focal lesion. Sinuses/Orbits: No acute finding. Other: None. IMPRESSION: No acute intracranial pathology.  Small-vessel white matter disease. Electronically Signed   By: Eddie Candle M.D.   On: 08/23/2018 22:35   Mr Brain Wo Contrast  Result Date: 08/24/2018 CLINICAL DATA:  85 y/o F; altered mental status. Last known normal 12 hours prior to arrival. EXAM: MRI HEAD WITHOUT CONTRAST TECHNIQUE: Multiplanar, multiecho pulse sequences of the brain and surrounding structures were obtained without intravenous contrast. COMPARISON:  08/23/2018 CT head. FINDINGS: Brain: No acute infarction, hemorrhage, hydrocephalus, extra-axial collection or mass lesion. Early confluent nonspecific T2 FLAIR hyperintensities in subcortical and periventricular white matter as well as the pons are compatible with moderate chronic microvascular ischemic changes. Moderate volume loss of the brain. Vascular: Normal flow voids. Skull and upper cervical spine: Normal marrow signal. Sinuses/Orbits: Negative. Other: None. IMPRESSION: 1. No acute intracranial abnormality identified. 2. Moderate chronic microvascular ischemic changes and volume loss of the brain. Electronically Signed   By: Kristine Garbe M.D.   On: 08/24/2018 03:37   Dg Chest Portable 1 View  Result Date: 08/24/2018 CLINICAL DATA:  85 year old female with altered mental status. EXAM:  PORTABLE CHEST 1 VIEW COMPARISON:  06/15/2018 and earlier. FINDINGS: Portable AP upright view at 0009 hours. Mildly lower lung volumes. Stable mild tortuosity of the thoracic aorta. Other mediastinal contours are within normal limits. Visualized tracheal air column is within normal limits. Allowing for portable technique the lungs are clear. Chronic cervical ACDF. Paucity bowel gas in the upper abdomen. IMPRESSION: No acute cardiopulmonary abnormality. Electronically Signed   By: Genevie Ann M.D.   On: 08/24/2018 01:04     CT and MRI both negative for acute pathology ____________________________________________   PROCEDURES  Procedure(s) performed: None  Procedures  Critical Care performed: No  ____________________________________________   INITIAL IMPRESSION / ASSESSMENT AND PLAN / ED COURSE  Pertinent labs &  imaging results that were available during my care of the patient were reviewed by me and considered in my medical decision making (see chart for details).   Patient returns for concerns of not acting right, abnormal behavior seeming disoriented.  No focal neurologic deficits, on my initial examination she appears slightly fatigued but in no distress with clear speech.  Family including daughter and son reports that she had a clear episode of confusion, son reports he was calling her by the daughter's name, just asking questions it did not seem right and her orientation seemed off at about noon today.  Clinical Course as of Aug 24 707  Mon Aug 24, 2018  0003 Called and spoke with Daughter [name] H.). Told her daughter she seemed like the world was odd.    [MQ]  [phone] Spoke with son, Heron Sabins. Plan to admit for further. Joey reporst yesterday for lunch mom was confused, but she just seemed confused like calling her son by his sister's name.    [MQ]  [phone] Patient's son understanding and agreeable with admit plan.    [MQ]    Clinical Course User Index [MQ] Delman Kitten, MD    ----------------------------------------- 7:09 AM on 08/24/2018 -----------------------------------------  Patient speech is clear, in no distress resting comfortably at this time.  She had an episode just prior to this where her speech seems somewhat garbled, she seemed confused and a little bit fatigued but this is resolved.  Her hemodynamics did not show hypo-tension or acute change during that episode, and I doubt this would be a TIA.  Likely some sort of metabolic or other potentially neurologic etiology.  Her speech is returned back to normal and she is fully awake and alert now as well.  Discussed with the hospitalist, Dr. Marcille Blanco will admit for further evaluation work-up and monitoring.  ____________________________________________   FINAL CLINICAL IMPRESSION(S) / ED DIAGNOSES  Final diagnoses:  Altered mental status, unspecified altered mental status type  Dysarthria        Note:  This document was prepared using Dragon voice recognition software and may include unintentional dictation errors       Delman Kitten, MD 08/24/18 0710

## 2018-08-24 NOTE — ED Notes (Addendum)
PT given snack and drink and sat up in bed

## 2018-08-24 NOTE — ED Notes (Signed)
Pt resting quietly.

## 2018-08-24 NOTE — ED Notes (Signed)
ED TO INPATIENT HANDOFF REPORT  ED Nurse Name and Phone #: Chesley Veasey [phone]  S Name/Age/Gender Alexa Lowe 85 y.o. female Room/Bed: ED10A/ED10A  Code Status   Code Status: Prior  Home/SNF/Other Home Patient oriented to: self and place Is this baseline? unknown  Triage Complete: Triage complete  Chief Complaint altered mental status  Triage Note Patient brought in by ems from home for altered mental status. Per ems patient called ems and patient's last known normal was 12 hours prior to arrival. Patient alert and oriented times two at this time.    Allergies Allergies  Allergen Reactions  . Morphine And Related Other (See Comments)    Reaction:  Hallucinations   . Codeine Nausea And Vomiting    Level of Care/Admitting Diagnosis ED Disposition    ED Disposition Condition Comment   Admit  The patient appears reasonably stabilized for admission considering the current resources, flow, and capabilities available in the ED at this time, and I doubt any other [facility] requiring further screening and/or treatment in the ED prior to admission is  present.       B Medical/Surgery History Past Medical History:  Diagnosis Date  . Cancer [facility])    Right breast  . COPD (chronic obstructive pulmonary disease) ([place])   . Diabetes mellitus without complication ([place])   . Hypertension    Past Surgical History:  Procedure Laterality Date  . ABDOMINAL SURGERY    . APPENDECTOMY    . BACK SURGERY    . BREAST SURGERY    . CHOLECYSTECTOMY    . ESOPHAGOGASTRODUODENOSCOPY (EGD) WITH PROPOFOL N/A 05/20/2018   Procedure: ESOPHAGOGASTRODUODENOSCOPY (EGD) WITH PROPOFOL;  Surgeon: Toledo, Benay Pike, MD;  Location: [facility] ENDOSCOPY;  Service: Gastroenterology;  Laterality: N/A;     A IV Location/Drains/Wounds Patient Lines/Drains/Airways Status   Active Line/Drains/Airways    Name:   Placement date:   Placement time:   Site:   Days:   Peripheral IV 08/24/18 Right;Anterior Forearm   08/24/18     0435    Forearm   less than 1   Airway   05/20/18    1324     96          Intake/Output Last 24 hours  Intake/Output Summary (Last 24 hours) at 08/24/2018 0739 Last data filed at 08/24/2018 0620 Gross per 24 hour  Intake 500 ml  Output -  Net 500 ml    Labs/Imaging Results for orders placed or performed during the hospital encounter of 08/23/18 (from the past 48 hour(s))  Comprehensive metabolic panel     Status: Abnormal   Collection Time: 08/23/18 10:08 PM  Result Value Ref Range   Sodium 141 135 - 145 mmol/L   Potassium 3.8 3.5 - 5.1 mmol/L   Chloride 107 98 - 111 mmol/L   CO2 22 22 - 32 mmol/L   Glucose, Bld 183 (H) 70 - 99 mg/dL   BUN 19 8 - 23 mg/dL   Creatinine, Ser 1.11 (H) 0.44 - 1.00 mg/dL   Calcium 9.3 8.9 - 10.3 mg/dL   Total Protein 6.9 6.5 - 8.1 g/dL   Albumin 3.8 3.5 - 5.0 g/dL   AST 21 15 - 41 U/L   ALT 11 0 - 44 U/L   Alkaline Phosphatase 83 38 - 126 U/L   Total Bilirubin 0.6 0.3 - 1.2 mg/dL   GFR calc non Af Amer 45 (L) >60 mL/min   GFR calc Af Amer 52 (L) >60 mL/min   Anion gap  12 5 - 15    Comment: Performed at [facility], [address] [place], [place] [address]  CBC     Status: Abnormal   Collection Time: 08/23/18 10:08 PM  Result Value Ref Range   WBC 11.2 (H) 4.0 - 10.5 K/uL   RBC 4.32 3.87 - 5.11 MIL/uL   Hemoglobin 11.7 (L) 12.0 - 15.0 g/dL   HCT 39.4 36.0 - 46.0 %   MCV 91.2 80.0 - 100.0 fL   MCH 27.1 26.0 - 34.0 pg   MCHC 29.7 (L) 30.0 - 36.0 g/dL   RDW 17.2 (H) 11.5 - 15.5 %   Platelets 237 150 - 400 K/uL   nRBC 0.0 0.0 - 0.2 %    Comment: Performed at [facility], [address] [place], [place] [address]  Urinalysis, Complete w Microscopic     Status: Abnormal   Collection Time: 08/24/18  4:21 AM  Result Value Ref Range   Color, Urine YELLOW (A) YELLOW   APPearance CLEAR (A) CLEAR   Specific Gravity, Urine 1.020 1.005 - 1.030   pH 5.0 5.0 - 8.0   Glucose, UA NEGATIVE NEGATIVE mg/dL   Hgb urine  dipstick NEGATIVE NEGATIVE   Bilirubin Urine NEGATIVE NEGATIVE   Ketones, ur NEGATIVE NEGATIVE mg/dL   Protein, ur NEGATIVE NEGATIVE mg/dL   Nitrite NEGATIVE NEGATIVE   Leukocytes,Ua NEGATIVE NEGATIVE   RBC / HPF 0-5 0 - 5 RBC/hpf   WBC, UA 0-5 0 - 5 WBC/hpf   Bacteria, UA NONE SEEN NONE SEEN   Squamous Epithelial / LPF 0-5 0 - 5   Mucus PRESENT    Hyaline Casts, UA PRESENT     Comment: Performed at [facility], [address] [place], [place] [address]  Glucose, capillary     Status: Abnormal   Collection Time: 08/24/18  6:51 AM  Result Value Ref Range   Glucose-Capillary 107 (H) 70 - 99 mg/dL   Ct Head Wo Contrast  Result Date: 08/23/2018 CLINICAL DATA:  Confusion EXAM: CT HEAD WITHOUT CONTRAST TECHNIQUE: Contiguous axial images were obtained from the base of the skull through the vertex without intravenous contrast. COMPARISON:  10/28/2014 FINDINGS: Brain: No evidence of acute infarction, hemorrhage, hydrocephalus, extra-axial collection or mass lesion/mass effect. Periventricular white matter hypodensity. Vascular: No hyperdense vessel or unexpected calcification. Skull: Normal. Negative for fracture or focal lesion. Sinuses/Orbits: No acute finding. Other: None. IMPRESSION: No acute intracranial pathology.  Small-vessel white matter disease. Electronically Signed   By: Eddie Candle M.D.   On: 08/23/2018 22:35   Mr Brain Wo Contrast  Result Date: 08/24/2018 CLINICAL DATA:  85 y/o F; altered mental status. Last known normal 12 hours prior to arrival. EXAM: MRI HEAD WITHOUT CONTRAST TECHNIQUE: Multiplanar, multiecho pulse sequences of the brain and surrounding structures were obtained without intravenous contrast. COMPARISON:  08/23/2018 CT head. FINDINGS: Brain: No acute infarction, hemorrhage, hydrocephalus, extra-axial collection or mass lesion. Early confluent nonspecific T2 FLAIR hyperintensities in subcortical and periventricular white matter as well as the pons are  compatible with moderate chronic microvascular ischemic changes. Moderate volume loss of the brain. Vascular: Normal flow voids. Skull and upper cervical spine: Normal marrow signal. Sinuses/Orbits: Negative. Other: None. IMPRESSION: 1. No acute intracranial abnormality identified. 2. Moderate chronic microvascular ischemic changes and volume loss of the brain. Electronically Signed   By: Kristine Garbe M.D.   On: 08/24/2018 03:37   Dg Chest Portable 1 View  Result Date: 08/24/2018 CLINICAL DATA:  85 year old female  with altered mental status. EXAM: PORTABLE CHEST 1 VIEW COMPARISON:  06/15/2018 and earlier. FINDINGS: Portable AP upright view at 0009 hours. Mildly lower lung volumes. Stable mild tortuosity of the thoracic aorta. Other mediastinal contours are within normal limits. Visualized tracheal air column is within normal limits. Allowing for portable technique the lungs are clear. Chronic cervical ACDF. Paucity bowel gas in the upper abdomen. IMPRESSION: No acute cardiopulmonary abnormality. Electronically Signed   By: Genevie Ann M.D.   On: 08/24/2018 01:04    Pending Labs Unresulted Labs (From admission, onward)   None      Vitals/Pain Today's Vitals   08/24/18 0349 08/24/18 0400 08/24/18 0633 08/24/18 0644  BP: (!) 151/63 (!) 159/62 (!) 162/79   Pulse: 63 62    Resp:  (!) 21 19   Temp:      TempSrc:      SpO2: 96% 97%  98%  Weight:      Height:      PainSc:        Isolation Precautions No active isolations  Medications Medications  aspirin chewable tablet 324 mg (has no administration in time range)  sodium chloride 0.9 % bolus 500 mL (0 mLs Intravenous Stopped 08/24/18 0174)    Mobility walks with person assist     Focused Assessments Cardiac Assessment Handoff:  Cardiac Rhythm: Normal sinus rhythm Lab Results  Component Value Date   CKTOTAL 27 05/06/2012   CKMB < 0.5 (L) 05/06/2012   TROPONINI <0.03 06/15/2018   No results found for: DDIMER Does the  Patient currently have chest pain? No     R Recommendations: See Admitting Provider Note  Report given to:   Additional Notes: per previous nurse, pt AO and conversing with periods of confusion and slurred speech. Family has been updated that pt will be admitted

## 2018-08-24 NOTE — ED Notes (Signed)
Pt returned from MRI with Tom,RN

## 2018-08-25 ENCOUNTER — Observation Stay: Payer: Medicare HMO

## 2018-08-25 ENCOUNTER — Observation Stay (HOSPITAL_BASED_OUTPATIENT_CLINIC_OR_DEPARTMENT_OTHER)
Admit: 2018-08-25 | Discharge: 2018-08-25 | Disposition: A | Discharge status: Home / Self Care | Payer: Medicare HMO | Attending: Internal Medicine | Admitting: Internal Medicine

## 2018-08-25 ENCOUNTER — Inpatient Hospital Stay: Payer: Medicare HMO

## 2018-08-25 DIAGNOSIS — G459 Transient cerebral ischemic attack, unspecified: Secondary | ICD-10-CM

## 2018-08-25 DIAGNOSIS — D62 Acute posthemorrhagic anemia: Secondary | ICD-10-CM | POA: Diagnosis present

## 2018-08-25 DIAGNOSIS — G9340 Encephalopathy, unspecified: Secondary | ICD-10-CM | POA: Diagnosis present

## 2018-08-25 DIAGNOSIS — Z79899 Other long term (current) drug therapy: Secondary | ICD-10-CM | POA: Diagnosis not present

## 2018-08-25 DIAGNOSIS — Z885 Allergy status to narcotic agent status: Secondary | ICD-10-CM | POA: Diagnosis not present

## 2018-08-25 DIAGNOSIS — E119 Type 2 diabetes mellitus without complications: Secondary | ICD-10-CM | POA: Diagnosis present

## 2018-08-25 DIAGNOSIS — K567 Ileus, unspecified: Secondary | ICD-10-CM | POA: Diagnosis not present

## 2018-08-25 DIAGNOSIS — I6523 Occlusion and stenosis of bilateral carotid arteries: Secondary | ICD-10-CM | POA: Diagnosis not present

## 2018-08-25 DIAGNOSIS — K254 Chronic or unspecified gastric ulcer with hemorrhage: Secondary | ICD-10-CM | POA: Diagnosis present

## 2018-08-25 DIAGNOSIS — I639 Cerebral infarction, unspecified: Secondary | ICD-10-CM | POA: Diagnosis present

## 2018-08-25 DIAGNOSIS — Z20828 Contact with and (suspected) exposure to other viral communicable diseases: Secondary | ICD-10-CM | POA: Diagnosis present

## 2018-08-25 DIAGNOSIS — Z66 Do not resuscitate: Secondary | ICD-10-CM | POA: Diagnosis present

## 2018-08-25 DIAGNOSIS — K921 Melena: Secondary | ICD-10-CM | POA: Diagnosis not present

## 2018-08-25 DIAGNOSIS — J449 Chronic obstructive pulmonary disease, unspecified: Secondary | ICD-10-CM | POA: Diagnosis present

## 2018-08-25 DIAGNOSIS — I10 Essential (primary) hypertension: Secondary | ICD-10-CM | POA: Diagnosis present

## 2018-08-25 DIAGNOSIS — Z8711 Personal history of peptic ulcer disease: Secondary | ICD-10-CM | POA: Diagnosis not present

## 2018-08-25 DIAGNOSIS — R471 Dysarthria and anarthria: Secondary | ICD-10-CM | POA: Diagnosis present

## 2018-08-25 DIAGNOSIS — Z853 Personal history of malignant neoplasm of breast: Secondary | ICD-10-CM | POA: Diagnosis not present

## 2018-08-25 DIAGNOSIS — R4182 Altered mental status, unspecified: Secondary | ICD-10-CM | POA: Diagnosis not present

## 2018-08-25 DIAGNOSIS — Z7984 Long term (current) use of oral hypoglycemic drugs: Secondary | ICD-10-CM | POA: Diagnosis not present

## 2018-08-25 DIAGNOSIS — Z515 Encounter for palliative care: Secondary | ICD-10-CM | POA: Diagnosis present

## 2018-08-25 DIAGNOSIS — Z7982 Long term (current) use of aspirin: Secondary | ICD-10-CM | POA: Diagnosis not present

## 2018-08-25 DIAGNOSIS — Z87891 Personal history of nicotine dependence: Secondary | ICD-10-CM | POA: Diagnosis not present

## 2018-08-25 DIAGNOSIS — Z7189 Other specified counseling: Secondary | ICD-10-CM | POA: Diagnosis not present

## 2018-08-25 LAB — GLUCOSE, CAPILLARY
Glucose-Capillary: 159 mg/dL — ABNORMAL HIGH (ref 70–99)
Glucose-Capillary: 140 mg/dL — ABNORMAL HIGH (ref 70–99)
Glucose-Capillary: 126 mg/dL — ABNORMAL HIGH (ref 70–99)

## 2018-08-25 LAB — LIPID PANEL
Cholesterol: 112 mg/dL (ref 0–200)
HDL: 42 mg/dL (ref >40)
LDL Cholesterol: 60 mg/dL (ref 0–99)
Total CHOL/HDL Ratio: 2.7 RATIO
Triglycerides: 52 mg/dL (ref <150)
VLDL: 10 mg/dL (ref 0–40)

## 2018-08-25 LAB — VITAMIN B12: Vitamin B-12: 1367 pg/mL — ABNORMAL HIGH (ref 180–914)

## 2018-08-25 LAB — ECHOCARDIOGRAM COMPLETE
Height: 66 in
Weight: 3209.9 oz

## 2018-08-25 MED ORDER — ACETAMINOPHEN 325 MG PO TABS: 650.0000 mg | ORAL_TABLET | ORAL | Status: DC | PRN

## 2018-08-25 MED ORDER — ACETAMINOPHEN 160 MG/5ML PO SOLN
650.0000 mg | ORAL | Status: DC | PRN
  Administered 2018-09-01: 10:00:00 650 mg
  Filled 2018-08-25 (×2): qty 20.3

## 2018-08-25 MED ORDER — ACETAMINOPHEN 650 MG RE SUPP: 650.0000 mg | RECTAL | Status: DC | PRN

## 2018-08-25 MED ORDER — HYDRALAZINE HCL 20 MG/ML IJ SOLN: 10.0000 mg | Freq: Four times a day (QID) | INTRAMUSCULAR | Status: DC | PRN

## 2018-08-25 MED ORDER — ACETAMINOPHEN 160 MG/5ML PO SOLN
650.0000 mg | ORAL | Status: DC | PRN
  Filled 2018-08-25: qty 20.3

## 2018-08-25 MED ORDER — ACETAMINOPHEN 325 MG PO TABS
650.0000 mg | ORAL_TABLET | ORAL | Status: DC | PRN
  Administered 2018-08-27 – 2018-09-01 (×7): 650 mg via ORAL
  Filled 2018-08-25 (×7): qty 2

## 2018-08-25 MED ORDER — SODIUM CHLORIDE 0.9 % IV SOLN
INTRAVENOUS | Status: DC
  Administered 2018-08-25 – 2018-08-31 (×8): via INTRAVENOUS

## 2018-08-25 MED ORDER — ACETAMINOPHEN 160 MG/5ML PO SOLN: 650.0000 mg | ORAL | Status: DC | PRN

## 2018-08-25 NOTE — Progress Notes (Signed)
PT Cancellation Note  Patient Details Name: Alexa Lowe MRN: [id number] DOB: [birth year]   Cancelled Treatment:    Reason Eval/Treat Not Completed: Patient at procedure or test/unavailable;Other (comment)(Patient to MRI at this time. Will attempt again at later time/date. )  Janna Arch, PT, DPT   08/25/2018, 2:09 PM

## 2018-08-25 NOTE — Progress Notes (Signed)
Subjective: Patient remains confused and combative.    Objective: Current vital signs: BP (!) 121/102 (BP Location: Right Arm)   Pulse 74   Temp 99.2 F (37.3 C) (Oral)   Resp 16   Ht '5\' 6"'  (1.676 m)   Wt 91 kg   SpO2 91%   BMI 32.38 kg/m  Vital signs in last 24 hours: Temp:  [98 F (36.7 C)-99.2 F (37.3 C)] 99.2 F (37.3 C) (05/19 0516) Pulse Rate:  [58-82] 74 (05/19 0516) Resp:  [16-20] 16 (05/19 0516) BP: (121-153)/(68-102) 121/102 (05/19 0516) SpO2:  [91 %-96 %] 91 % (05/19 0516) Weight:  [91 kg] 91 kg (05/18 1459)  Intake/Output from previous day: 05/18 0701 - 05/19 0700 In: 0  Out: 600 [Urine:600] Intake/Output this shift: No intake/output data recorded. Nutritional status:  Diet Order            Diet NPO time specified  Diet effective now              Neurologic Exam: Mental Status: Resting but easily aroused. Once aroused patient is uncooperative.  Says little.     Cranial Nerves: II: Does not blink to confrontation from the right III,IV, VI: Extra-ocular motions intact bilaterally V,VII: No facial asymmetry noted VIII: hearing normal bilaterally IX,X: gag reflex present XI: bilateral shoulder shrug XII: midline tongue extension Motor: Patient moves all extremities against gravity but will not cooperate for formal testing   Lab Results: Basic Metabolic Panel: Recent Labs  Lab 08/23/18 2208  NA 141  K 3.8  CL 107  CO2 22  GLUCOSE 183*  BUN 19  CREATININE 1.11*  CALCIUM 9.3    Liver Function Tests: Recent Labs  Lab 08/23/18 2208  AST 21  ALT 11  ALKPHOS 83  BILITOT 0.6  PROT 6.9  ALBUMIN 3.8   No results for input(s): LIPASE, AMYLASE in the last 168 hours. No results for input(s): AMMONIA in the last 168 hours.  CBC: Recent Labs  Lab 08/23/18 2208  WBC 11.2*  HGB 11.7*  HCT 39.4  MCV 91.2  PLT 237    Cardiac Enzymes: No results for input(s): CKTOTAL, CKMB, CKMBINDEX, TROPONINI in the last 168 hours.  Lipid  Panel: Recent Labs  Lab 08/25/18 0341  CHOL 112  TRIG 52  HDL 42  CHOLHDL 2.7  VLDL 10  LDLCALC 60    CBG: Recent Labs  Lab 08/24/18 0651 08/24/18 1154 08/24/18 1631 08/24/18 2211 08/25/18 0749  GLUCAP 107* 118* 141* 148* 159*    Microbiology: Results for orders placed or performed during the hospital encounter of 08/23/18  SARS Coronavirus 2 (CEPHEID - Performed in [facility] hospital lab), Hosp Order     Status: None   Collection Time: 08/24/18  8:35 AM  Result Value Ref Range Status   SARS Coronavirus 2 NEGATIVE NEGATIVE Final    Comment: (NOTE) If result is NEGATIVE SARS-CoV-2 target nucleic acids are NOT DETECTED. The SARS-CoV-2 RNA is generally detectable in upper and lower  respiratory specimens during the acute phase of infection. The lowest  concentration of SARS-CoV-2 viral copies this assay can detect is 250  copies / mL. A negative result does not preclude SARS-CoV-2 infection  and should not be used as the sole basis for treatment or other  patient management decisions.  A negative result may occur with  improper specimen collection / handling, submission of specimen other  than nasopharyngeal swab, presence of viral mutation(s) within the  areas targeted by this assay,  and inadequate number of viral copies  (<250 copies / mL). A negative result must be combined with clinical  observations, patient history, and epidemiological information. If result is POSITIVE SARS-CoV-2 target nucleic acids are DETECTED. The SARS-CoV-2 RNA is generally detectable in upper and lower  respiratory specimens dur ing the acute phase of infection.  Positive  results are indicative of active infection with SARS-CoV-2.  Clinical  correlation with patient history and other diagnostic information is  necessary to determine patient infection status.  Positive results do  not rule out bacterial infection or co-infection with other viruses. If result is PRESUMPTIVE  POSTIVE SARS-CoV-2 nucleic acids MAY BE PRESENT.   A presumptive positive result was obtained on the submitted specimen  and confirmed on repeat testing.  While 2019 novel coronavirus  (SARS-CoV-2) nucleic acids may be present in the submitted sample  additional confirmatory testing may be necessary for epidemiological  and / or clinical management purposes  to differentiate between  SARS-CoV-2 and other Sarbecovirus currently known to infect humans.  If clinically indicated additional testing with an alternate test  methodology [id number]) is advised. The SARS-CoV-2 RNA is generally  detectable in upper and lower respiratory sp ecimens during the acute  phase of infection. The expected result is Negative. Fact Sheet for Patients:  StrictlyIdeas.no Fact Sheet for Healthcare Providers: BankingDealers.co.za This test is not yet approved or cleared by the [place] FDA and has been authorized for detection and/or diagnosis of SARS-CoV-2 by FDA under an Emergency Use Authorization (EUA).  This EUA will remain in effect (meaning this test can be used) for the duration of the COVID-19 declaration under Section 564(b)(1) of the Act, 21 U.S.C. section 360bbb-3(b)(1), unless the authorization is terminated or revoked sooner. Performed at [facility], [address] [place], [place] [address]     Coagulation Studies: No results for input(s): LABPROT, INR in the last 72 hours.  Imaging: Ct Head Wo Contrast  Result Date: 08/23/2018 CLINICAL DATA:  Confusion EXAM: CT HEAD WITHOUT CONTRAST TECHNIQUE: Contiguous axial images were obtained from the base of the skull through the vertex without intravenous contrast. COMPARISON:  10/28/2014 FINDINGS: Brain: No evidence of acute infarction, hemorrhage, hydrocephalus, extra-axial collection or mass lesion/mass effect. Periventricular white matter hypodensity. Vascular: No hyperdense vessel or  unexpected calcification. Skull: Normal. Negative for fracture or focal lesion. Sinuses/Orbits: No acute finding. Other: None. IMPRESSION: No acute intracranial pathology.  Small-vessel white matter disease. Electronically Signed   By: Eddie Candle M.D.   On: 08/23/2018 22:35   Mr Brain Wo Contrast  Result Date: 08/24/2018 CLINICAL DATA:  85 y/o F; altered mental status. Last known normal 12 hours prior to arrival. EXAM: MRI HEAD WITHOUT CONTRAST TECHNIQUE: Multiplanar, multiecho pulse sequences of the brain and surrounding structures were obtained without intravenous contrast. COMPARISON:  08/23/2018 CT head. FINDINGS: Brain: No acute infarction, hemorrhage, hydrocephalus, extra-axial collection or mass lesion. Early confluent nonspecific T2 FLAIR hyperintensities in subcortical and periventricular white matter as well as the pons are compatible with moderate chronic microvascular ischemic changes. Moderate volume loss of the brain. Vascular: Normal flow voids. Skull and upper cervical spine: Normal marrow signal. Sinuses/Orbits: Negative. Other: None. IMPRESSION: 1. No acute intracranial abnormality identified. 2. Moderate chronic microvascular ischemic changes and volume loss of the brain. Electronically Signed   By: Kristine Garbe M.D.   On: 08/24/2018 03:37   US Carotid Bilateral (at [facility] Only)  Result Date: 08/25/2018 CLINICAL DATA:  TIA EXAM: BILATERAL CAROTID DUPLEX  ULTRASOUND TECHNIQUE: Pearline Cables scale imaging, color Doppler and duplex ultrasound were performed of bilateral carotid and vertebral arteries in the neck. COMPARISON:  09/15/2010 FINDINGS: Criteria: Quantification of carotid stenosis is based on velocity parameters that correlate the residual internal carotid diameter with NASCET-based stenosis levels, using the diameter of the distal internal carotid lumen as the denominator for stenosis measurement. The following velocity measurements were obtained: RIGHT ICA: 87 cm/sec CCA:  72 cm/sec SYSTOLIC ICA/CCA RATIO:  1.2 ECA: 136 cm/sec LEFT ICA: 93 cm/sec CCA: 73 cm/sec SYSTOLIC ICA/CCA RATIO:  1.3 ECA: 108 cm/sec RIGHT CAROTID ARTERY: Little if any plaque in the bulb. Low resistance internal carotid Doppler pattern. RIGHT VERTEBRAL ARTERY:  Antegrade. LEFT CAROTID ARTERY: Little if any plaque in the bulb. Mild calcified plaque in the lower internal carotid. Low resistance internal carotid Doppler pattern. LEFT VERTEBRAL ARTERY:  Antegrade. IMPRESSION: Less than 50% stenosis in the right and left internal carotid arteries. Electronically Signed   By: Marybelle Killings M.D.   On: 08/25/2018 11:55   Dg Chest Portable 1 View  Result Date: 08/24/2018 CLINICAL DATA:  85 year old female with altered mental status. EXAM: PORTABLE CHEST 1 VIEW COMPARISON:  06/15/2018 and earlier. FINDINGS: Portable AP upright view at 0009 hours. Mildly lower lung volumes. Stable mild tortuosity of the thoracic aorta. Other mediastinal contours are within normal limits. Visualized tracheal air column is within normal limits. Allowing for portable technique the lungs are clear. Chronic cervical ACDF. Paucity bowel gas in the upper abdomen. IMPRESSION: No acute cardiopulmonary abnormality. Electronically Signed   By: Genevie Ann M.D.   On: 08/24/2018 01:04    Medications:  I have reviewed the patient's current medications. Scheduled: . aspirin  300 mg Rectal Daily   Or  . aspirin  325 mg Oral Daily  . atorvastatin  40 mg Oral QHS  . citalopram  30 mg Oral Daily  . enoxaparin (LOVENOX) injection  40 mg Subcutaneous Q24H  . gabapentin  300 mg Oral TID  . insulin aspart  0-5 Units Subcutaneous QHS  . insulin aspart  0-9 Units Subcutaneous TID WC  . Melatonin  10 mg Oral QHS  . metoprolol tartrate  25 mg Oral BID  . nystatin ointment  1 application Topical BID  . pantoprazole  40 mg Oral BID  . sucralfate  1 g Oral QID    Assessment/Plan: Patient remains altered.  Would not cooperate for EEG.  TSH, B12 and  ESR are unremarkable.  B1 pending.  Recommendations: 1. Would repeat MRI with contrast.  Patient will require some sedation.     LOS: 0 days   Alexis Goodell, MD Neurology [phone] 08/25/2018  12:05 PM

## 2018-08-25 NOTE — Progress Notes (Signed)
PT Cancellation Note  Patient Details Name: Alexa Lowe MRN: [id number] DOB: 1930-07-27   Cancelled Treatment:    Reason Eval/Treat Not Completed: Patient at procedure or test/unavailable;Other (comment)(Patient with speech therapy. Will return again at later time/date.  )  Janna Arch, PT, DPT   08/25/2018, 10:56 AM

## 2018-08-25 NOTE — Progress Notes (Signed)
PT Cancellation Note  Patient Details Name: Alexa Lowe MRN: [id number] DOB: 20-Mar-1931   Cancelled Treatment:    Reason Eval/Treat Not Completed: Other (comment);Fatigue/lethargy limiting ability to participate(Unable to rouse patient with verbal stimuli and/or sternal rub. Will attempt again at later time/date. )  Janna Arch, PT, DPT   08/25/2018, 3:33 PM

## 2018-08-25 NOTE — Progress Notes (Signed)
OT Cancellation Note  Patient Details Name: DEISSY GUILBERT MRN: [id number] DOB: 30-Mar-1931   Cancelled Treatment:    Reason Eval/Treat Not Completed: Patient at procedure or test/ unavailable. Consult received, chart reviewed. Pt currently unavailable 2/2 testing. Will re-attempt at later date/time as pt is available and medically appropriate.   Jeni Salles, MPH, MS, OTR/L ascom [phone] 08/25/18, 8:48 AM

## 2018-08-25 NOTE — Progress Notes (Signed)
PT Cancellation Note  Patient Details Name: Alexa Lowe MRN: [id number] DOB: 1931/01/24   Cancelled Treatment:    Reason Eval/Treat Not Completed: Patient at procedure or test/unavailable;Other (comment)(Patient recieving echo. Per nursing pt threw up this morning. Will attempt again at later time/date. )  Janna Arch, PT, DPT   08/25/2018, 8:57 AM

## 2018-08-25 NOTE — Progress Notes (Signed)
[facility] [place] NAME: Brooklinn Longbottom    MR#:  [id number]  DATE OF BIRTH:  1931/03/05  SUBJECTIVE:  CHIEF COMPLAINT:   Chief Complaint  Patient presents with  . Altered Mental Status  Patient seen today Has vomiting since yesterday Not able to tolerate diet Lethargic  REVIEW OF SYSTEMS:    ROS  Could not be obtained as patient is lethargic  DRUG ALLERGIES:   Allergies  Allergen Reactions  . Morphine And Related Other (See Comments)    Reaction:  Hallucinations   . Codeine Nausea And Vomiting    VITALS:  Blood pressure (!) 121/102, pulse 74, temperature 99.2 F (37.3 C), temperature source Oral, resp. rate 16, height 5\' 6"  (1.676 m), weight 91 kg, SpO2 91 %.  PHYSICAL EXAMINATION:   Physical Exam  GENERAL:  85 y.o.-year-old patient lying in the bed with no acute distress.  EYES: Pupils equal, round, reactive to light and accommodation. No scleral icterus. Extraocular muscles intact.  HEENT: Head atraumatic, normocephalic. Oropharynx dry and nasopharynx clear.  NECK:  Supple, no jugular venous distention. No thyroid enlargement, no tenderness.  LUNGS: Normal breath sounds bilaterally, no wheezing, rales, rhonchi. No use of accessory muscles of respiration.  CARDIOVASCULAR: S1, S2 normal. No murmurs, rubs, or gallops.  ABDOMEN: Soft, nontender, nondistended. Bowel sounds present. No organomegaly or mass.  EXTREMITIES: No cyanosis, clubbing or edema b/l.    NEUROLOGIC: Awake and arousable to loud verbal commands Has difficulty in speech PSYCHIATRIC: The patient is alert and oriented x 1 SKIN: No obvious rash, lesion, or ulcer.   LABORATORY PANEL:   CBC Recent Labs  Lab 08/23/18 2208  WBC 11.2*  HGB 11.7*  HCT 39.4  PLT 237   ------------------------------------------------------------------------------------------------------------------ Chemistries  Recent Labs  Lab 08/23/18 2208  NA 141  K 3.8  CL 107   CO2 22  GLUCOSE 183*  BUN 19  CREATININE 1.11*  CALCIUM 9.3  AST 21  ALT 11  ALKPHOS 83  BILITOT 0.6   ------------------------------------------------------------------------------------------------------------------  Cardiac Enzymes No results for input(s): TROPONINI in the last 168 hours. ------------------------------------------------------------------------------------------------------------------  RADIOLOGY:  Ct Head Wo Contrast  Result Date: 08/23/2018 CLINICAL DATA:  Confusion EXAM: CT HEAD WITHOUT CONTRAST TECHNIQUE: Contiguous axial images were obtained from the base of the skull through the vertex without intravenous contrast. COMPARISON:  10/28/2014 FINDINGS: Brain: No evidence of acute infarction, hemorrhage, hydrocephalus, extra-axial collection or mass lesion/mass effect. Periventricular white matter hypodensity. Vascular: No hyperdense vessel or unexpected calcification. Skull: Normal. Negative for fracture or focal lesion. Sinuses/Orbits: No acute finding. Other: None. IMPRESSION: No acute intracranial pathology.  Small-vessel white matter disease. Electronically Signed   By: Eddie Candle M.D.   On: 08/23/2018 22:35   Mr Brain Wo Contrast  Result Date: 08/24/2018 CLINICAL DATA:  85 y/o F; altered mental status. Last known normal 12 hours prior to arrival. EXAM: MRI HEAD WITHOUT CONTRAST TECHNIQUE: Multiplanar, multiecho pulse sequences of the brain and surrounding structures were obtained without intravenous contrast. COMPARISON:  08/23/2018 CT head. FINDINGS: Brain: No acute infarction, hemorrhage, hydrocephalus, extra-axial collection or mass lesion. Early confluent nonspecific T2 FLAIR hyperintensities in subcortical and periventricular white matter as well as the pons are compatible with moderate chronic microvascular ischemic changes. Moderate volume loss of the brain. Vascular: Normal flow voids. Skull and upper cervical spine: Normal marrow signal. Sinuses/Orbits:  Negative. Other: None. IMPRESSION: 1. No acute intracranial abnormality identified. 2. Moderate chronic microvascular ischemic changes and volume loss  of the brain. Electronically Signed   By: Kristine Garbe M.D.   On: 08/24/2018 03:37   US Carotid Bilateral (at [facility] Only)  Result Date: 08/25/2018 CLINICAL DATA:  TIA EXAM: BILATERAL CAROTID DUPLEX ULTRASOUND TECHNIQUE: Pearline Cables scale imaging, color Doppler and duplex ultrasound were performed of bilateral carotid and vertebral arteries in the neck. COMPARISON:  09/15/2010 FINDINGS: Criteria: Quantification of carotid stenosis is based on velocity parameters that correlate the residual internal carotid diameter with NASCET-based stenosis levels, using the diameter of the distal internal carotid lumen as the denominator for stenosis measurement. The following velocity measurements were obtained: RIGHT ICA: 87 cm/sec CCA: 72 cm/sec SYSTOLIC ICA/CCA RATIO:  1.2 ECA: 136 cm/sec LEFT ICA: 93 cm/sec CCA: 73 cm/sec SYSTOLIC ICA/CCA RATIO:  1.3 ECA: 108 cm/sec RIGHT CAROTID ARTERY: Little if any plaque in the bulb. Low resistance internal carotid Doppler pattern. RIGHT VERTEBRAL ARTERY:  Antegrade. LEFT CAROTID ARTERY: Little if any plaque in the bulb. Mild calcified plaque in the lower internal carotid. Low resistance internal carotid Doppler pattern. LEFT VERTEBRAL ARTERY:  Antegrade. IMPRESSION: Less than 50% stenosis in the right and left internal carotid arteries. Electronically Signed   By: Marybelle Killings M.D.   On: 08/25/2018 11:55   Dg Chest Portable 1 View  Result Date: 08/24/2018 CLINICAL DATA:  85 year old female with altered mental status. EXAM: PORTABLE CHEST 1 VIEW COMPARISON:  06/15/2018 and earlier. FINDINGS: Portable AP upright view at 0009 hours. Mildly lower lung volumes. Stable mild tortuosity of the thoracic aorta. Other mediastinal contours are within normal limits. Visualized tracheal air column is within normal limits. Allowing for  portable technique the lungs are clear. Chronic cervical ACDF. Paucity bowel gas in the upper abdomen. IMPRESSION: No acute cardiopulmonary abnormality. Electronically Signed   By: Genevie Ann M.D.   On: 08/24/2018 01:04     ASSESSMENT AND PLAN:  85 year old female patient with history of COPD, type 2 diabetes mellitus, breast cancer, hypertension currently under hospitalist service for altered mental status  -Acute encephalopathy Multifactorial Neurology follow-up Carotid ultrasound less than 50% stenosis MRI brain no acute abnormality Check for EEG Echocardiogram reviewed Continue aspirin  -Nausea and vomiting Unable to tolerate diet Status post speech therapy evaluation Currently n.p.o.  -COPD Continue nebulization therapy  -Hypertension Switched to IV meds for better control of blood pressure  -DVT prophylaxis subcu Lovenox daily  -Type 2 diabetes mellitus Sliding scale coverage with insulin  -Family updated  All the records are reviewed and case discussed with Care Management/Social Worker. Management plans discussed with the patient, family and they are in agreement.  CODE STATUS: DNR  DVT Prophylaxis: SCDs  TOTAL TIME TAKING CARE OF THIS PATIENT: 38 minutes.   POSSIBLE D/C IN  2 to 3 DAYS, DEPENDING ON CLINICAL CONDITION.  Saundra Shelling M.D on 08/25/2018 at 12:07 PM  Between 7am to 6pm - Pager - [phone]  After 6pm go to [email or web address] - password [email or web address] [facility]  Office  [phone]  CC: Primary care physician; Dion Body, MD  Note: This dictation was prepared with Dragon dictation along with smaller phrase technology. Any transcriptional errors that result from this process are unintentional.

## 2018-08-25 NOTE — Progress Notes (Signed)
*  PRELIMINARY RESULTS* Echocardiogram 2D Echocardiogram has been performed.  [place] 08/25/2018, 9:14 AM

## 2018-08-25 NOTE — Progress Notes (Signed)
OT Cancellation Note  Patient Details Name: Alexa Lowe MRN: [id number] DOB: 1930/05/11   Cancelled Treatment:    Reason Eval/Treat Not Completed: Patient at procedure or test/ unavailable. Pt out for additional testing. Will re-attempt at later date/time as pt available and medically appropriate.   Jeni Salles, MPH, MS, OTR/L ascom [phone] 08/25/18, 11:49 AM

## 2018-08-25 NOTE — Progress Notes (Signed)
SLP Cancellation Note  Patient Details Name: IONNA AVIS MRN: [id number] DOB: 07/03/1930   Cancelled treatment:       Reason Eval/Treat Not Completed: Patient's level of consciousness;Patient not medically ready(chart reviewed; consulted NSG and attempted to see pt). Pt agitated and swinging legs to side of bed attempting to get out of bed. Pt was also moaning and w/ unintelligible speech. She did not follow any instructions from SLP, NSG. NSG in room now to address bathing pt d/t stool. NSG giving medication for N/V per report. Due to pt's closed eyes, poor comprehension and declined Cognitive status, no po's are recommended currently secondary to pt's increased risk for choking/aspiration. Recommend alternative means of medications. ST services will continue to f/u today/tomorroow for appropriateness for, safety w/, po intake. Recommend frequent oral care for hygiene and stimulation of swallowing. NSG updated.     Orinda Kenner, [place], CCC-SLP Tifany Hirsch 08/25/2018, 11:04 AM

## 2018-08-25 NOTE — Progress Notes (Signed)
OT Cancellation Note  Patient Details Name: Alexa Lowe MRN: [id number] DOB: 04-30-1930   Cancelled Treatment:    Reason Eval/Treat Not Completed: Patient at procedure or test/ unavailable. On 3rd attempt, transport staff in pt's room. 2nd MRI noted in orders. Will re-attempt OT evaluation next date as pt is available and medically appropriate.   Jeni Salles, MPH, MS, OTR/L ascom [phone] 08/25/18, 2:09 PM

## 2018-08-26 ENCOUNTER — Inpatient Hospital Stay: Payer: Medicare HMO

## 2018-08-26 DIAGNOSIS — I639 Cerebral infarction, unspecified: Secondary | ICD-10-CM

## 2018-08-26 LAB — CBG (GLUCOSE < 50 OR >400 CONTRAINDICATION FOR TPA): CBG: 119

## 2018-08-26 LAB — GLUCOSE, CAPILLARY
Glucose-Capillary: 131 mg/dL — ABNORMAL HIGH (ref 70–99)
Glucose-Capillary: 130 mg/dL — ABNORMAL HIGH (ref 70–99)
Glucose-Capillary: 116 mg/dL — ABNORMAL HIGH (ref 70–99)

## 2018-08-26 LAB — POCT CBG MONITORING: CBG: 136

## 2018-08-26 MED ORDER — ORAL CARE MOUTH RINSE
15.0000 mL | Freq: Two times a day (BID) | OROMUCOSAL | Status: DC
  Administered 2018-08-26 – 2018-08-30 (×8): 15 mL via OROMUCOSAL

## 2018-08-26 MED ORDER — CLOPIDOGREL BISULFATE 75 MG PO TABS: 75.0000 mg | ORAL_TABLET | Freq: Every day | ORAL | Status: DC

## 2018-08-26 MED ORDER — ASPIRIN EC 81 MG PO TBEC: 81.0000 mg | DELAYED_RELEASE_TABLET | Freq: Every day | ORAL | Status: DC

## 2018-08-26 MED ORDER — KETOROLAC TROMETHAMINE 15 MG/ML IJ SOLN
15.0000 mg | Freq: Once | INTRAMUSCULAR | Status: AC
  Administered 2018-08-26: 22:00:00 15 mg via INTRAVENOUS
  Filled 2018-08-26: qty 1

## 2018-08-26 MED ORDER — QUETIAPINE FUMARATE 25 MG PO TABS
25.0000 mg | ORAL_TABLET | Freq: Every day | ORAL | Status: DC
  Administered 2018-08-27 – 2018-09-01 (×6): 25 mg via ORAL
  Filled 2018-08-26 (×6): qty 1

## 2018-08-26 MED ORDER — CLOPIDOGREL BISULFATE 75 MG PO TABS
75.0000 mg | ORAL_TABLET | Freq: Every day | ORAL | Status: DC
  Administered 2018-08-28: 09:00:00 75 mg via ORAL
  Filled 2018-08-26: qty 1

## 2018-08-26 MED ORDER — GABAPENTIN 100 MG PO CAPS
100.0000 mg | ORAL_CAPSULE | Freq: Three times a day (TID) | ORAL | Status: DC
  Administered 2018-08-27 – 2018-09-02 (×18): 100 mg via ORAL
  Filled 2018-08-26 (×19): qty 1

## 2018-08-26 MED ORDER — ASPIRIN EC 81 MG PO TBEC
81.0000 mg | DELAYED_RELEASE_TABLET | Freq: Every day | ORAL | Status: DC
  Administered 2018-08-28: 81 mg via ORAL
  Filled 2018-08-26: qty 1

## 2018-08-26 MED ORDER — LORAZEPAM 2 MG/ML IJ SOLN
1.0000 mg | Freq: Once | INTRAMUSCULAR | Status: AC
  Administered 2018-08-26: 1 mg via INTRAVENOUS
  Filled 2018-08-26: qty 1

## 2018-08-26 MED ORDER — GADOBUTROL 1 MMOL/ML IV SOLN
9.0000 mL | Freq: Once | INTRAVENOUS | Status: AC | PRN
  Administered 2018-08-26: 9 mL via INTRAVENOUS

## 2018-08-26 NOTE — Progress Notes (Signed)
OT Cancellation Note  Patient Details Name: Alexa Lowe MRN: [id number] DOB: [birth year]   Cancelled Treatment:    Reason Eval/Treat Not Completed: Other (comment). Spoke with RN who reports pt was tachycardic this am with heart rate in 140's, decreased responsiveness, and with a 2nd MRI pending this am. Will hold OT evaluation and re-attempt at later date/time as pt is appropriate.   Jeni Salles, MPH, MS, OTR/L ascom [phone] 08/26/18, 8:45 AM

## 2018-08-26 NOTE — Progress Notes (Signed)
Pt requesting gabapentin performed swallow screening. Resulted provided to MD pt. To remain strict NPO. NP provided order for PRN IV pain medication.

## 2018-08-26 NOTE — Progress Notes (Addendum)
[facility] [place] NAME: Alexa Lowe    MR#:  [id number]  DATE OF BIRTH:  1930-04-30  SUBJECTIVE:  CHIEF COMPLAINT:   Chief Complaint  Patient presents with  . Altered Mental Status  Patient seen today Confused Awake but not completely oriented to time place and person  REVIEW OF SYSTEMS:    ROS  Could not be obtained as patient is lethargic and confused  DRUG ALLERGIES:   Allergies  Allergen Reactions  . Morphine And Related Other (See Comments)    Reaction:  Hallucinations   . Codeine Nausea And Vomiting    VITALS:  Blood pressure 131/63, pulse (!) 59, temperature 98.8 F (37.1 C), temperature source Oral, resp. rate 18, height 5\' 6"  (1.676 m), weight 91 kg, SpO2 94 %.  PHYSICAL EXAMINATION:   Physical Exam  GENERAL:  85 y.o.-year-old patient lying in the bed  EYES: Pupils equal, round, reactive to light and accommodation. No scleral icterus. Extraocular muscles intact.  HEENT: Head atraumatic, normocephalic. Oropharynx dry and nasopharynx clear.  NECK:  Supple, no jugular venous distention. No thyroid enlargement, no tenderness.  LUNGS: Normal breath sounds bilaterally, no wheezing, rales, rhonchi. No use of accessory muscles of respiration.  CARDIOVASCULAR: S1, S2 normal. No murmurs, rubs, or gallops.  ABDOMEN: Soft, nontender, nondistended. Bowel sounds present. No organomegaly or mass.  EXTREMITIES: No cyanosis, clubbing or edema b/l.    NEUROLOGIC: Awake but not oriented Moves all extremities PSYCHIATRIC: The patient is alert and oriented x none SKIN: No obvious rash, lesion, or ulcer.   LABORATORY PANEL:   CBC Recent Labs  Lab 08/23/18 2208  WBC 11.2*  HGB 11.7*  HCT 39.4  PLT 237   ------------------------------------------------------------------------------------------------------------------ Chemistries  Recent Labs  Lab 08/23/18 2208  NA 141  K 3.8  CL 107  CO2 22  GLUCOSE 183*  BUN 19   CREATININE 1.11*  CALCIUM 9.3  AST 21  ALT 11  ALKPHOS 83  BILITOT 0.6   ------------------------------------------------------------------------------------------------------------------  Cardiac Enzymes No results for input(s): TROPONINI in the last 168 hours. ------------------------------------------------------------------------------------------------------------------  RADIOLOGY:  Dg Abd 1 View  Result Date: 08/25/2018 CLINICAL DATA:  Ileus diabetes EXAM: ABDOMEN - 1 VIEW COMPARISON:  CT 06/15/2018 FINDINGS: Right upper quadrant surgical clips. Lung bases are clear. Surgical hardware in the lumbosacral spine. Nonobstructed gas pattern with moderate feces. Mild impacted feces at the rectum. IMPRESSION: Nonobstructed gas pattern with moderate feces Electronically Signed   By: Donavan Foil M.D.   On: 08/25/2018 20:55   US Carotid Bilateral (at [facility] Only)  Result Date: 08/25/2018 CLINICAL DATA:  TIA EXAM: BILATERAL CAROTID DUPLEX ULTRASOUND TECHNIQUE: Pearline Cables scale imaging, color Doppler and duplex ultrasound were performed of bilateral carotid and vertebral arteries in the neck. COMPARISON:  09/15/2010 FINDINGS: Criteria: Quantification of carotid stenosis is based on velocity parameters that correlate the residual internal carotid diameter with NASCET-based stenosis levels, using the diameter of the distal internal carotid lumen as the denominator for stenosis measurement. The following velocity measurements were obtained: RIGHT ICA: 87 cm/sec CCA: 72 cm/sec SYSTOLIC ICA/CCA RATIO:  1.2 ECA: 136 cm/sec LEFT ICA: 93 cm/sec CCA: 73 cm/sec SYSTOLIC ICA/CCA RATIO:  1.3 ECA: 108 cm/sec RIGHT CAROTID ARTERY: Little if any plaque in the bulb. Low resistance internal carotid Doppler pattern. RIGHT VERTEBRAL ARTERY:  Antegrade. LEFT CAROTID ARTERY: Little if any plaque in the bulb. Mild calcified plaque in the lower internal carotid. Low resistance internal carotid Doppler pattern. LEFT  VERTEBRAL ARTERY:  Antegrade. IMPRESSION: Less than 50% stenosis in the right and left internal carotid arteries. Electronically Signed   By: Marybelle Killings M.D.   On: 08/25/2018 11:55     ASSESSMENT AND PLAN:  85 year old female patient with history of COPD, type 2 diabetes mellitus, breast cancer, hypertension currently under hospitalist service for altered mental status  -Acute encephalopathy Secondary to new CVA Repeat MRI brain reveals small CVA Neurology follow-up appreciated Carotid ultrasound less than 50% stenosis Check for EEG if patient cooperates Echocardiogram reviewed Continue aspirin  -Nausea and vomiting Improving Unable to tolerate diet Status post speech therapy evaluation Currently n.p.o. IV fluids  -COPD Continue nebulization therapy  -Hypertension Switched to IV meds for better control of blood pressure  -DVT prophylaxis subcu Lovenox daily  -Type 2 diabetes mellitus Sliding scale coverage with insulin  -Family updated  All the records are reviewed and case discussed with Care Management/Social Worker. Management plans discussed with the patient, family and they are in agreement.  CODE STATUS: DNR  DVT Prophylaxis: SCDs  TOTAL TIME TAKING CARE OF THIS PATIENT: 37 minutes.   POSSIBLE D/C IN  2 to 3 DAYS, DEPENDING ON CLINICAL CONDITION.  Saundra Shelling M.D on 08/26/2018 at 12:08 PM  Between 7am to 6pm - Pager - [phone]  After 6pm go to [email or web address] - password [email or web address] [facility]  Office  [phone]  CC: Primary care physician; Dion Body, MD  Note: This dictation was prepared with Dragon dictation along with smaller phrase technology. Any transcriptional errors that result from this process are unintentional.

## 2018-08-26 NOTE — Progress Notes (Signed)
Subjective: Patient improved today.  More cooperative and following commands but not fully oriented.    Objective: Current vital signs: BP 131/63   Pulse (!) 59   Temp 98.8 F (37.1 C) (Oral)   Resp 18   Ht 5\' 6"  (1.676 m)   Wt 91 kg   SpO2 94%   BMI 32.38 kg/m  Vital signs in last 24 hours: Temp:  [98.7 F (37.1 C)-98.9 F (37.2 C)] 98.8 F (37.1 C) (05/20 1142) Pulse Rate:  [59-75] 59 (05/20 1142) Resp:  [16-18] 18 (05/20 1142) BP: (129-158)/(58-63) 131/63 (05/20 1142) SpO2:  [94 %-97 %] 94 % (05/20 1142)  Intake/Output from previous day: 05/19 0701 - 05/20 0700 In: 634.3 [I.V.:634.3] Out: -  Intake/Output this shift: Total I/O In: -  Out: 250 [Urine:250] Nutritional status:  Diet Order            Diet NPO time specified  Diet effective now              Neurologic Exam: Mental Status: Awake and alert.  Follows commands.  Does not know where she is but is able to tell me where she lives and is oriented to self.  Speech fluent.      Cranial Nerves: II: Does not blink to confrontation from the right III,IV, DD:UKGUR-KYHCWC motions intact bilaterally V,VII:No facial asymmetry noted VIII: hearing normal bilaterally IX,X: gag reflex present XI: bilateral shoulder shrug XII: midline tongue extension Motor: Patient moves all extremities against gravity with no focal weakness noted.    Lab Results: Basic Metabolic Panel: Recent Labs  Lab 08/23/18 2208  NA 141  K 3.8  CL 107  CO2 22  GLUCOSE 183*  BUN 19  CREATININE 1.11*  CALCIUM 9.3    Liver Function Tests: Recent Labs  Lab 08/23/18 2208  AST 21  ALT 11  ALKPHOS 83  BILITOT 0.6  PROT 6.9  ALBUMIN 3.8   No results for input(s): LIPASE, AMYLASE in the last 168 hours. No results for input(s): AMMONIA in the last 168 hours.  CBC: Recent Labs  Lab 08/23/18 2208  WBC 11.2*  HGB 11.7*  HCT 39.4  MCV 91.2  PLT 237    Cardiac Enzymes: No results for input(s): CKTOTAL, CKMB,  CKMBINDEX, TROPONINI in the last 168 hours.  Lipid Panel: Recent Labs  Lab 08/25/18 0341  CHOL 112  TRIG 52  HDL 42  CHOLHDL 2.7  VLDL 10  LDLCALC 60    CBG: Recent Labs  Lab 08/24/18 2211 08/25/18 0749 08/25/18 1243 08/25/18 1654 08/25/18 2105  GLUCAP 148* 159* 140* 131* 126*    Microbiology: Results for orders placed or performed during the hospital encounter of 08/23/18  SARS Coronavirus 2 (CEPHEID - Performed in [facility] hospital lab), Hosp Order     Status: None   Collection Time: 08/24/18  8:35 AM  Result Value Ref Range Status   SARS Coronavirus 2 NEGATIVE NEGATIVE Final    Comment: (NOTE) If result is NEGATIVE SARS-CoV-2 target nucleic acids are NOT DETECTED. The SARS-CoV-2 RNA is generally detectable in upper and lower  respiratory specimens during the acute phase of infection. The lowest  concentration of SARS-CoV-2 viral copies this assay can detect is 250  copies / mL. A negative result does not preclude SARS-CoV-2 infection  and should not be used as the sole basis for treatment or other  patient management decisions.  A negative result may occur with  improper specimen collection / handling, submission of specimen other  than nasopharyngeal swab, presence of viral mutation(s) within the  areas targeted by this assay, and inadequate number of viral copies  (<250 copies / mL). A negative result must be combined with clinical  observations, patient history, and epidemiological information. If result is POSITIVE SARS-CoV-2 target nucleic acids are DETECTED. The SARS-CoV-2 RNA is generally detectable in upper and lower  respiratory specimens dur ing the acute phase of infection.  Positive  results are indicative of active infection with SARS-CoV-2.  Clinical  correlation with patient history and other diagnostic information is  necessary to determine patient infection status.  Positive results do  not rule out bacterial infection or co-infection with  other viruses. If result is PRESUMPTIVE POSTIVE SARS-CoV-2 nucleic acids MAY BE PRESENT.   A presumptive positive result was obtained on the submitted specimen  and confirmed on repeat testing.  While 2019 novel coronavirus  (SARS-CoV-2) nucleic acids may be present in the submitted sample  additional confirmatory testing may be necessary for epidemiological  and / or clinical management purposes  to differentiate between  SARS-CoV-2 and other Sarbecovirus currently known to infect humans.  If clinically indicated additional testing with an alternate test  methodology [id number]) is advised. The SARS-CoV-2 RNA is generally  detectable in upper and lower respiratory sp ecimens during the acute  phase of infection. The expected result is Negative. Fact Sheet for Patients:  StrictlyIdeas.no Fact Sheet for Healthcare Providers: BankingDealers.co.za This test is not yet approved or cleared by the [place] FDA and has been authorized for detection and/or diagnosis of SARS-CoV-2 by FDA under an Emergency Use Authorization (EUA).  This EUA will remain in effect (meaning this test can be used) for the duration of the COVID-19 declaration under Section 564(b)(1) of the Act, 21 U.S.C. section 360bbb-3(b)(1), unless the authorization is terminated or revoked sooner. Performed at [facility], [address] [place], [place] [address]     Coagulation Studies: No results for input(s): LABPROT, INR in the last 72 hours.  Imaging: Dg Abd 1 View  Result Date: 08/25/2018 CLINICAL DATA:  Ileus diabetes EXAM: ABDOMEN - 1 VIEW COMPARISON:  CT 06/15/2018 FINDINGS: Right upper quadrant surgical clips. Lung bases are clear. Surgical hardware in the lumbosacral spine. Nonobstructed gas pattern with moderate feces. Mild impacted feces at the rectum. IMPRESSION: Nonobstructed gas pattern with moderate feces Electronically Signed   By: Donavan Foil  M.D.   On: 08/25/2018 20:55   Mr Brain W Contrast  Result Date: 08/26/2018 CLINICAL DATA:  Follow-up examination for stroke. EXAM: MRI HEAD WITH CONTRAST TECHNIQUE: Multiplanar, multiecho pulse sequences of the brain and surrounding structures were obtained with intravenous contrast. CONTRAST:  9 cc of Gadavist. COMPARISON:  Prior brain MRI from 08/24/2018 FINDINGS: Brain: Cerebral atrophy with chronic microvascular ischemic disease again noted. Now seen is a punctate 4 mm focus of diffusion abnormality involving the left parietal cortex (series 2, image 37), suspicious for a small acute ischemic infarct. Evaluation for associated hemorrhage limited on this postcontrast exam. No associated mass effect. No other evidence for acute or subacute ischemia. No visible mass lesion. No midline shift or mass effect. No hydrocephalus. No extra-axial fluid collection. No abnormal enhancement. Vascular: Grossly patent on this limited postcontrast exam. Skull and upper cervical spine: Negative. Sinuses/Orbits: Globes and orbital soft tissues within normal limits. Paranasal sinuses and mastoid air cells are largely clear. Other: None. IMPRESSION: 1. 4 mm focus of diffusion abnormality at the left parietal cortex, suspicious for a small acute ischemic infarct.  2. Otherwise stable appearance of the brain. No abnormal enhancement. Electronically Signed   By: Jeannine Boga M.D.   On: 08/26/2018 13:29   US Carotid Bilateral (at [facility] And [facility] Only)  Result Date: 08/25/2018 CLINICAL DATA:  TIA EXAM: BILATERAL CAROTID DUPLEX ULTRASOUND TECHNIQUE: Pearline Cables scale imaging, color Doppler and duplex ultrasound were performed of bilateral carotid and vertebral arteries in the neck. COMPARISON:  09/15/2010 FINDINGS: Criteria: Quantification of carotid stenosis is based on velocity parameters that correlate the residual internal carotid diameter with NASCET-based stenosis levels, using the diameter of the distal internal carotid lumen  as the denominator for stenosis measurement. The following velocity measurements were obtained: RIGHT ICA: 87 cm/sec CCA: 72 cm/sec SYSTOLIC ICA/CCA RATIO:  1.2 ECA: 136 cm/sec LEFT ICA: 93 cm/sec CCA: 73 cm/sec SYSTOLIC ICA/CCA RATIO:  1.3 ECA: 108 cm/sec RIGHT CAROTID ARTERY: Little if any plaque in the bulb. Low resistance internal carotid Doppler pattern. RIGHT VERTEBRAL ARTERY:  Antegrade. LEFT CAROTID ARTERY: Little if any plaque in the bulb. Mild calcified plaque in the lower internal carotid. Low resistance internal carotid Doppler pattern. LEFT VERTEBRAL ARTERY:  Antegrade. IMPRESSION: Less than 50% stenosis in the right and left internal carotid arteries. Electronically Signed   By: Marybelle Killings M.D.   On: 08/25/2018 11:55    Medications:  I have reviewed the patient's current medications. Scheduled: . aspirin  300 mg Rectal Daily   Or  . aspirin  325 mg Oral Daily  . atorvastatin  40 mg Oral QHS  . citalopram  30 mg Oral Daily  . enoxaparin (LOVENOX) injection  40 mg Subcutaneous Q24H  . gabapentin  100 mg Oral TID  . insulin aspart  0-5 Units Subcutaneous QHS  . insulin aspart  0-9 Units Subcutaneous TID WC  . mouth rinse  15 mL Mouth Rinse BID  . Melatonin  10 mg Oral QHS  . nystatin ointment  1 application Topical BID  . pantoprazole  40 mg Oral BID  . QUEtiapine  25 mg Oral QHS  . sucralfate  1 g Oral QID    Assessment/Plan: Patient admitted with altered mental status.  Visual abnormalities noted on neurological examination.  Initial MRI of the brain was unremarkable.  Patient had a repeat contrast MRI today that was reviewed and shows a small, acute left parietal infarct infarct.  Concern is for embolic etiology.  Mental status improved today.  With improving mental status, infarct could have been cause.  If mental status continues to improve, no further work up indicate other than stroke work up.  If no further improvement noted may need to consider further investigation.    Carotid dopplers show no evidence of hemodynamically significant stenosis.  Echocardiogram shows no cardiac source of emboli with an EF of 55-60%.  Thiamine level remains pending.    Recommendations: 1. HgbA1c, fasting lipid panel 2. PT consult, OT consult, Speech consult 3. Prophylactic therapy-Patient on ASA and Lipitor prior to admission.  Would continue ASA and add Plavix at 75mg  daily for one month after which would change to 75mg  daily 4. NPO until RN stroke swallow screen 5. Telemetry monitoring 6. Frequent neuro checks 7. If inpatient stroke work up unremarkable would consider evaluation for outpatient prolonged cardiac monitoring.     LOS: 1 day   Alexis Goodell, MD Neurology [phone] 08/26/2018  1:34 PM

## 2018-08-26 NOTE — Progress Notes (Signed)
PT Cancellation Note  Patient Details Name: Alexa Lowe MRN: [id number] DOB: 02-07-1931   Cancelled Treatment:    Reason Eval/Treat Not Completed: Other (comment)(Per OT and nursing pt was tachycardic this am with heart rate in 140's, decreased responsiveness, and with a 2nd MRI pending this am. Will hold PT evaluation and re-attempt at later date/time as pt is appropriate. )  Janna Arch, PT, DPT   08/26/2018, 9:16 AM

## 2018-08-26 NOTE — Progress Notes (Signed)
PT Cancellation Note  Patient Details Name: JAMELL OPFER MRN: [id number] DOB: 1930-08-25   Cancelled Treatment:    Reason Eval/Treat Not Completed: Other (comment);Fatigue/lethargy limiting ability to participate(Unable to rouse patient with verbal stimuli and/or sternal rub. Will attempt again at later time/date. )  Janna Arch, PT, DPT   08/26/2018, 1:24 PM

## 2018-08-26 NOTE — Progress Notes (Signed)
SLP Cancellation Note  Patient Details Name: Alexa Lowe MRN: [id number] DOB: November 18, 1930   Cancelled treatment:       Reason Eval/Treat Not Completed: Patient's level of consciousness;Medical issues which prohibited therapy;Patient not medically ready(chart reviewed)  Due to pt's current medical decline and decreased responsiveness, ST will hold on performing the BSE at this time. ST services will continue to follow pt's status for appropriateness for assessment. Recommend frequent oral care for hygiene and stimulation of swallowing.     Orinda Kenner, [place], CCC-SLP Watson,Katherine 08/26/2018, 10:07 AM

## 2018-08-27 DIAGNOSIS — G459 Transient cerebral ischemic attack, unspecified: Secondary | ICD-10-CM

## 2018-08-27 DIAGNOSIS — Z515 Encounter for palliative care: Secondary | ICD-10-CM

## 2018-08-27 DIAGNOSIS — K567 Ileus, unspecified: Secondary | ICD-10-CM

## 2018-08-27 DIAGNOSIS — R471 Dysarthria and anarthria: Secondary | ICD-10-CM

## 2018-08-27 DIAGNOSIS — Z7189 Other specified counseling: Secondary | ICD-10-CM

## 2018-08-27 LAB — CBC
HCT: 34.4 % — ABNORMAL LOW (ref 36.0–46.0)
Hemoglobin: 10.6 g/dL — ABNORMAL LOW (ref 12.0–15.0)
MCH: 27 pg (ref 26.0–34.0)
MCHC: 30.8 g/dL (ref 30.0–36.0)
MCV: 87.8 fL (ref 80.0–100.0)
Platelets: 218 10*3/uL (ref 150–400)
RBC: 3.92 MIL/uL (ref 3.87–5.11)
RDW: 17.2 % — ABNORMAL HIGH (ref 11.5–15.5)
WBC: 10.1 10*3/uL (ref 4.0–10.5)
nRBC: 0 % (ref 0.0–0.2)

## 2018-08-27 LAB — BASIC METABOLIC PANEL
Anion gap: 9 (ref 5–15)
BUN: 18 mg/dL (ref 8–23)
CO2: 24 mmol/L (ref 22–32)
Calcium: 8.9 mg/dL (ref 8.9–10.3)
Chloride: 109 mmol/L (ref 98–111)
Creatinine, Ser: 0.84 mg/dL (ref 0.44–1.00)
GFR calc Af Amer: >60 mL/min (ref >60)
GFR calc non Af Amer: >60 mL/min (ref >60)
Glucose, Bld: 103 mg/dL — ABNORMAL HIGH (ref 70–99)
Potassium: 3.7 mmol/L (ref 3.5–5.1)
Sodium: 142 mmol/L (ref 135–145)

## 2018-08-27 LAB — GLUCOSE, CAPILLARY
Glucose-Capillary: 102 mg/dL — ABNORMAL HIGH (ref 70–99)
Glucose-Capillary: 108 mg/dL — ABNORMAL HIGH (ref 70–99)
Glucose-Capillary: 107 mg/dL — ABNORMAL HIGH (ref 70–99)
Glucose-Capillary: 112 mg/dL — ABNORMAL HIGH (ref 70–99)

## 2018-08-27 LAB — LIPID PANEL
Cholesterol: 117 mg/dL (ref 0–200)
HDL: 33 mg/dL — ABNORMAL LOW (ref >40)
LDL Cholesterol: 69 mg/dL (ref 0–99)
Total CHOL/HDL Ratio: 3.5 RATIO
Triglycerides: 75 mg/dL (ref <150)
VLDL: 15 mg/dL (ref 0–40)

## 2018-08-27 LAB — HEMOGLOBIN A1C
Hgb A1c MFr Bld: 5.7 % — ABNORMAL HIGH (ref 4.8–5.6)
Mean Plasma Glucose: 116.89 mg/dL

## 2018-08-27 LAB — VITAMIN B1: Vitamin B1 (Thiamine): 162.6 nmol/L (ref 66.5–200.0)

## 2018-08-27 MED ORDER — KETOROLAC TROMETHAMINE 30 MG/ML IJ SOLN
15.0000 mg | Freq: Once | INTRAMUSCULAR | Status: AC
  Administered 2018-08-27: 05:00:00 15 mg via INTRAVENOUS
  Filled 2018-08-27: qty 1

## 2018-08-27 NOTE — Evaluation (Signed)
Clinical/Bedside Swallow Evaluation Patient Details  Name: Alexa Lowe MRN: [id number] Date of Birth: [birth year]  Today's Date: 08/27/2018 Time: SLP Start Time (ACUTE ONLY): 0900 SLP Stop Time (ACUTE ONLY): 0959 SLP Time Calculation (min) (ACUTE ONLY): 59 min  Past Medical History:  Past Medical History:  Diagnosis Date  . Cancer [facility])    Right breast  . COPD (chronic obstructive pulmonary disease) ([place])   . Diabetes mellitus without complication ([place])   . Hypertension    Past Surgical History:  Past Surgical History:  Procedure Laterality Date  . ABDOMINAL SURGERY    . APPENDECTOMY    . BACK SURGERY    . BREAST SURGERY    . CHOLECYSTECTOMY    . ESOPHAGOGASTRODUODENOSCOPY (EGD) WITH PROPOFOL N/A 05/20/2018   Procedure: ESOPHAGOGASTRODUODENOSCOPY (EGD) WITH PROPOFOL;  Surgeon: Toledo, Benay Pike, MD;  Location: [facility] ENDOSCOPY;  Service: Gastroenterology;  Laterality: N/A;   HPI:  H&P 08/24/2018: "Alexa Lowe  is a 85 y.o. female with a known history of COPD, type 2 diabetes mellitus, breast cancer, hypertension presented to the emergency room for confusion.  Patient also had trouble in speech.  Patient was referred to the emergency room she was worked up with MRI brain which showed no acute abnormality.  Speech appeared better in the emergency room.  No weakness or tingling or numbness in any part of the body.  Hospitalist service was consulted.  No history of fall or head injury.  Patient more awake and alert in the emergency room and responds to all verbal commands.  In the emergency room patient was worked up with CT head and MRI brain."    Patient has been lethargic and unable to participate in swallow evaluation until today.   Assessment / Plan / Recommendation Clinical Impression  The patient is presenting with oropharyngeal swallowing within normal limits and with no significant risk for aspiration.  The patient was assessed with ice chips, spoon sips water, cup rim sips of  water, water via straw, self-feeding applesauce, and one-half graham cracker.  The patient has no clinical indicators of aspiration (no cough/throat clear, no change in vocal quality and no oral residue post swallow.  The patient is reporting that her previous diet appears to be self-selected mechanical soft.  Recommend Dysphagia 3 diet with thin liquids.  The patient seems to prefer to drink vs. eating.  In addition, the patient is presenting with functional cognitive communication skills.  Her speech is clear, she is able to verbalize wants/needs, and follow directions.  The patient has no further speech therapy needs, will sign off.  Please re-consult ST with any swallowing or communication concerns.   SLP Visit Diagnosis: Dysphagia, unspecified (R13.10)    Aspiration Risk  No limitations    Diet Recommendation Dysphagia 3 (Mech soft);Thin liquid   Liquid Administration via: Straw;Cup Medication Administration: Whole meds with liquid Supervision: Patient able to self feed Postural Changes: Seated upright at 90 degrees;Remain upright for at least 30 minutes after po intake    Other  Recommendations Oral Care Recommendations: Oral care BID   Follow up Recommendations None      Frequency and Duration            Prognosis        Swallow Study   General Date of Onset: 08/24/18 HPI: H&P 08/24/2018: "Alexa Lowe  is a 85 y.o. female with a known history of COPD, type 2 diabetes mellitus, breast cancer, hypertension presented to the emergency room for confusion.  Patient  also had trouble in speech.  Patient was referred to the emergency room she was worked up with MRI brain which showed no acute abnormality.  Speech appeared better in the emergency room.  No weakness or tingling or numbness in any part of the body.  Hospitalist service was consulted.  No history of fall or head injury.  Patient more awake and alert in the emergency room and responds to all verbal commands.  In the emergency  room patient was worked up with CT head and MRI brain."  Patient has been lethargic and unable to participate in swallow evaluation until today. Type of Study: Bedside Swallow Evaluation Previous Swallow Assessment: None known Diet Prior to this Study: NPO;Other (Comment)(Home diet appears to be mechanical soft) Temperature Spikes Noted: No Respiratory Status: Room air History of Recent Intubation: No Behavior/Cognition: Alert;Cooperative;Pleasant mood Oral Cavity Assessment: Within Functional Limits Oral Cavity - Dentition: Dentures, top;Dentures, bottom Vision: Functional for self-feeding Self-Feeding Abilities: Able to feed self Patient Positioning: Upright in bed Baseline Vocal Quality: Normal Volitional Cough: Strong Volitional Swallow: Able to elicit    Oral/Motor/Sensory Function Overall Oral Motor/Sensory Function: Within functional limits   Ice Chips Ice chips: Within functional limits Presentation: Spoon   Thin Liquid Thin Liquid: Within functional limits Presentation: Self Fed;Cup;Spoon;Straw    Nectar Thick     Honey Thick     Puree Puree: Within functional limits Presentation: Self Fed;Spoon   Solid     Solid: Within functional limits Presentation: Self Fed     [name], MS/CCC- SLP  [name], [name] 08/27/2018,9:59 AM

## 2018-08-27 NOTE — Evaluation (Addendum)
Occupational Therapy Evaluation Patient Details Name: Alexa Lowe MRN: [id number] DOB: [birth year] Today's Date: 08/27/2018    History of Present Illness Pt. is an 85 y.o. female who was amitted to [facility]  with confusion, and impaired speech. Imaging revealed 4 mm focus of diffusion abnormality at the Left Parietal cortex suspicious for small acute infarct. Pt. PMHx includes: COPD, Type 2 DM, Breast CA, HTN, Acute Encephalopathy, TIA, COPD, Hyperlipidemia, and HTN    Clinical Impression   Pt. presents with weakness, limited activity tolerance,limited cognition, and limited functional mobility which limits her ability to complete basic ADL and IADL functioning. Pt. resides at home alone in an apartment. Per pt. report Pt. was w/c bound, and performmed ADLs, and IADL functioning independently, and  Was independently transferring from with w/c to the chair, and commode. Pt. performed light meal preparation usually heating up a sausage biscuit in the microwave at 1pm and medication management. Pt. reported that her neighbors pick up items from the grocery store. Pt. Pt. could benefit from OT services for ADL training, A/E training, and pt. education about cognitive compensatory strategies. Pt. would benefit from [facility] services upon discharge. Pt. could benefit from follow-up OT services at discharge.    Follow Up Recommendations  HHOT    Equipment Recommendations      Recommendations for Other Services       Precautions / Restrictions Precautions Precautions: Fall Restrictions Weight Bearing Restrictions: No      Mobility Bed Mobility  Pt. Seen at bedside    Transfers Overall transfer level: Needs assistance   Transfers: Sit to/from Stand;Stand Pivot Transfers           General transfer comment: Per PT report    Balance Overall balance assessment: Needs assistance Sitting-balance support: No upper extremity supported;Feet supported Sitting balance-Leahy Scale: Good      Standing balance support: Bilateral upper extremity supported Standing balance-Leahy Scale: Poor                             ADL either performed or assessed with clinical judgement   ADL Overall ADL's : Needs assistance/impaired   Eating/Feeding Details (indicate cue type and reason): Poor appetitie, Independent drinking. Grooming: Set up;Supervision/safety   Upper Body Bathing: Set up;Minimal assistance   Lower Body Bathing: Set up;Moderate assistance   Upper Body Dressing : Set up;Minimal assistance   Lower Body Dressing: Set up;Moderate assistance                       Vision Baseline Vision/History: Wears glasses Wears Glasses: At all times Vision Assessment?: Vision impaired- to be further tested in functional context     Perception     Praxis      Pertinent Vitals/Pain Pain Assessment: Faces Faces Pain Scale: Hurts little more Pain Location: R knee Pain Descriptors / Indicators: Aching;Grimacing;Guarding Pain Intervention(s): Limited activity within patient's tolerance;Monitored during session;Repositioned     Hand Dominance Right   Extremity/Trunk Assessment Upper Extremity Assessment Upper Extremity Assessment: Overall WFL for tasks assessed(grossly 4/5 throughout, symmetrical without focal weakness/sensory deficit reported)   Lower Extremity Assessment Lower Extremity Assessment: Overall WFL for tasks assessed(grossly 4/5 throughout, symmetrical without focal weakness/sensory deficit reported)       Communication Communication Communication: No difficulties(difficulty maintaining train of thought at times)   Cognition Arousal/Alertness: Awake/alert Behavior During Therapy: WFL for tasks assessed/performed Overall Cognitive Status: No family/caregiver present to determine baseline cognitive functioning Area  of Impairment: Orientation                               General Comments: oriented to self, general location;  follows simple commands; intermittently confused with difficulty maintaining topic of conversation at times   General Comments       Exercises   Shoulder Instructions      Home Living Family/patient expects to be discharged to:: Private residence(senior living complex) Living Arrangements: Alone Available Help at Discharge: Family;Available PRN/intermittently Type of Home: Apartment       Home Layout: One level     Bathroom Shower/Tub: Chief Strategy Officer: Shower seat;Wheelchair - manual          Prior Functioning/Environment Level of Independence: Independent with assistive device(s)        Comments: Mod indep with ADLs, household mobility with manual WC as primary mobility; SPT without assist device to/from seating surfaces.  Does ambulate short-distances (limited household) without assist device at times; denies fall history.        OT Problem List: Decreased knowledge of use of DME or AE;Decreased coordination;Decreased activity tolerance;Decreased strength;Decreased cognition;Impaired UE functional use      OT Treatment/Interventions: Self-care/ADL training;Therapeutic exercise;Patient/family education;DME and/or AE instruction;Neuromuscular education;Cognitive remediation/compensation    OT Goals(Current goals can be found in the care plan section) Acute Rehab OT Goals Patient Stated Goal: to get another blanket  OT Frequency: Min 2X/week   Barriers to D/C:            Co-evaluation              AM-PAC OT "6 Clicks" Daily Activity     Outcome Measure Help from another person eating meals?: None(Drinking. Poor appetite) Help from another person taking care of personal grooming?: A Little Help from another person toileting, which includes using toliet, bedpan, or urinal?: A Lot Help from another person bathing (including washing, rinsing, drying)?: A Lot Help from another person to put on and taking off regular upper body  clothing?: A Little Help from another person to put on and taking off regular lower body clothing?: A Lot 6 Click Score: 16   End of Session    Activity Tolerance: Patient limited by fatigue Patient left: in bed;with call bell/phone within reach;with bed alarm set  OT Visit Diagnosis: Muscle weakness (generalized) (M62.81)                Time: 2774-1287 OT Time Calculation (min): 22 min Charges:  OT General Charges $OT Visit: 1 Visit OT Evaluation $OT Eval Low Complexity: 1 Low  Harrel Carina, MS, OTR/L   Harrel Carina 08/27/2018, 5:31 PM

## 2018-08-27 NOTE — Progress Notes (Signed)
[place] [facility] [place] NAME: Alexa Lowe    MR#:  [id number]  DATE OF BIRTH:  [birth year]  SUBJECTIVE:  CHIEF COMPLAINT:   Chief Complaint  Patient presents with  . Altered Mental Status  Patient seen today More awake and responds to verbal commands Better oriented No chest pain No shortness of breath  REVIEW OF SYSTEMS:    ROS Review of Systems  Constitutional: generalized weakness  HENT: Negative.   Eyes: Negative.   Respiratory: Negative.   Cardiovascular: Negative.   Gastrointestinal: Negative.   Genitourinary: Negative.   Musculoskeletal: Negative.   Skin: Negative.   Neurological: speech better  Endo/Heme/Allergies: Negative.   Psychiatric/Behavioral: Negative.   All other systems reviewed and are negative.  DRUG ALLERGIES:   Allergies  Allergen Reactions  . Morphine And Related Other (See Comments)    Reaction:  Hallucinations   . Codeine Nausea And Vomiting    VITALS:  Blood pressure 124/70, pulse (!) 129, temperature 98.1 F (36.7 C), resp. rate 19, height 5\' 6"  (1.676 m), weight 91 kg, SpO2 93 %.  PHYSICAL EXAMINATION:   Physical Exam  GENERAL:  85 y.o.-year-old patient lying in the bed  EYES: Pupils equal, round, reactive to light and accommodation. No scleral icterus. Extraocular muscles intact.  HEENT: Head atraumatic, normocephalic. Oropharynx dry and nasopharynx clear.  NECK:  Supple, no jugular venous distention. No thyroid enlargement, no tenderness.  LUNGS: Normal breath sounds bilaterally, no wheezing, rales, rhonchi. No use of accessory muscles of respiration.  CARDIOVASCULAR: S1, S2 normal. No murmurs, rubs, or gallops.  ABDOMEN: Soft, nontender, nondistended. Bowel sounds present. No organomegaly or mass.  EXTREMITIES: No cyanosis, clubbing or edema b/l.    NEUROLOGIC: Awake , responds to all verbal commands Moves all extremities PSYCHIATRIC: The patient is alert and oriented x 2 SKIN: No  obvious rash, lesion, or ulcer.   LABORATORY PANEL:   CBC Recent Labs  Lab 08/27/18 0358  WBC 10.1  HGB 10.6*  HCT 34.4*  PLT 218   ------------------------------------------------------------------------------------------------------------------ Chemistries  Recent Labs  Lab 08/23/18 2208 08/27/18 0358  NA 141 142  K 3.8 3.7  CL 107 109  CO2 22 24  GLUCOSE 183* 103*  BUN 19 18  CREATININE 1.11* 0.84  CALCIUM 9.3 8.9  AST 21  --   ALT 11  --   ALKPHOS 83  --   BILITOT 0.6  --    ------------------------------------------------------------------------------------------------------------------  Cardiac Enzymes No results for input(s): TROPONINI in the last 168 hours. ------------------------------------------------------------------------------------------------------------------  RADIOLOGY:  Mr Jeri Cos Contrast  Result Date: 08/26/2018 CLINICAL DATA:  Follow-up examination for stroke. EXAM: MRI HEAD WITH CONTRAST TECHNIQUE: Multiplanar, multiecho pulse sequences of the brain and surrounding structures were obtained with intravenous contrast. CONTRAST:  9 cc of Gadavist. COMPARISON:  Prior brain MRI from 08/24/2018 FINDINGS: Brain: Cerebral atrophy with chronic microvascular ischemic disease again noted. Now seen is a punctate 4 mm focus of diffusion abnormality involving the left parietal cortex (series 2, image 37), suspicious for a small acute ischemic infarct. Evaluation for associated hemorrhage limited on this postcontrast exam. No associated mass effect. No other evidence for acute or subacute ischemia. No visible mass lesion. No midline shift or mass effect. No hydrocephalus. No extra-axial fluid collection. No abnormal enhancement. Vascular: Grossly patent on this limited postcontrast exam. Skull and upper cervical spine: Negative. Sinuses/Orbits: Globes and orbital soft tissues within normal limits. Paranasal sinuses and mastoid air cells are largely clear. Other:  None. IMPRESSION: 1. 4 mm focus of diffusion abnormality at the left parietal cortex, suspicious for a small acute ischemic infarct. 2. Otherwise stable appearance of the brain. No abnormal enhancement. Electronically Signed   By: Jeannine Boga M.D.   On: 08/26/2018 13:29   US Carotid Bilateral (at [facility] And [facility] Only)  Result Date: 08/25/2018 CLINICAL DATA:  TIA EXAM: BILATERAL CAROTID DUPLEX ULTRASOUND TECHNIQUE: Pearline Cables scale imaging, color Doppler and duplex ultrasound were performed of bilateral carotid and vertebral arteries in the neck. COMPARISON:  09/15/2010 FINDINGS: Criteria: Quantification of carotid stenosis is based on velocity parameters that correlate the residual internal carotid diameter with NASCET-based stenosis levels, using the diameter of the distal internal carotid lumen as the denominator for stenosis measurement. The following velocity measurements were obtained: RIGHT ICA: 87 cm/sec CCA: 72 cm/sec SYSTOLIC ICA/CCA RATIO:  1.2 ECA: 136 cm/sec LEFT ICA: 93 cm/sec CCA: 73 cm/sec SYSTOLIC ICA/CCA RATIO:  1.3 ECA: 108 cm/sec RIGHT CAROTID ARTERY: Little if any plaque in the bulb. Low resistance internal carotid Doppler pattern. RIGHT VERTEBRAL ARTERY:  Antegrade. LEFT CAROTID ARTERY: Little if any plaque in the bulb. Mild calcified plaque in the lower internal carotid. Low resistance internal carotid Doppler pattern. LEFT VERTEBRAL ARTERY:  Antegrade. IMPRESSION: Less than 50% stenosis in the right and left internal carotid arteries. Electronically Signed   By: Marybelle Killings M.D.   On: 08/25/2018 11:55     ASSESSMENT AND PLAN:  85 year old female patient with history of COPD, type 2 diabetes mellitus, breast cancer, hypertension currently under hospitalist service for altered mental status  -Acute CVA Repeat MRI brain reveals small CVA Neurology follow-up appreciated Carotid ultrasound less than 50% stenosis Check for EEG if patient cooperates Echocardiogram reviewed Continue  aspirin Speech therapy follow-up will do swallow study and recommend diet Appreciate neurology follow-up Lumbar puncture deferred for now  -Nausea and vomiting Improved Restart diet after speech therapy recommendations Currently n.p.o. IV fluids  -COPD Continue nebulization therapy  -Hypertension Switched to IV meds for better control of blood pressure  -DVT prophylaxis subcu Lovenox daily  -Type 2 diabetes mellitus Sliding scale coverage with insulin  -Family updated  All the records are reviewed and case discussed with Care Management/Social Worker. Management plans discussed with the patient, family and they are in agreement.  CODE STATUS: DNR  DVT Prophylaxis: SCDs  TOTAL TIME TAKING CARE OF THIS PATIENT: 37 minutes.   POSSIBLE D/C IN  2 to 3 DAYS, DEPENDING ON CLINICAL CONDITION.  Saundra Shelling M.D on 08/27/2018 at 9:58 AM  Between 7am to 6pm - Pager - [phone]  After 6pm go to [email or web address] - password [email or web address] [facility]  Office  [phone]  CC: Primary care physician; Dion Body, MD  Note: This dictation was prepared with Dragon dictation along with smaller phrase technology. Any transcriptional errors that result from this process are unintentional.

## 2018-08-27 NOTE — Progress Notes (Signed)
OT Cancellation Note  Patient Details Name: AYRIEL TEXIDOR MRN: [id number] DOB: 1930/04/10   Cancelled Treatment:    Reason Eval/Treat Not Completed: Other (comment). Pt with nursing. Will re-attempt OT evaluation next date.   Jeni Salles, MPH, MS, OTR/L ascom [phone] 08/27/18, 1:14 PM

## 2018-08-27 NOTE — Consult Note (Addendum)
Consultation Note Date: 08/27/2018   Patient Name: Alexa Lowe  DOB: Jul 25, 1930  MRN: [id number]  Age / Sex: 85 y.o., female  PCP: Dion Body, MD Referring Physician: Saundra Shelling, MD  Reason for Consultation: Establishing goals of care  HPI/Patient Profile: Alexa Lowe  is a 85 y.o. female with a known history of COPD, type 2 diabetes mellitus, breast cancer, hypertension presented to the emergency room for confusion.  Clinical Assessment and Goals of Care: Patient is sitting in bed watching [place]. She is able to tell me her name, location, the year, and that she is here because a stroke.   She states she has been widowed 18 years. She has 3 children. One in [place], One in [place], and a son that lives in [place], but is moving to [facility] in the next month. She has 9 grandchildren. She lives alone. She uses a wheelchair. She does her own cooking and cleaning, but states " I don't know how much longer I can do this." She states she has a friend that she goes to the store with. She denies issue with oral intake, but states she is not hungry at this time, as breakfast is untouched.    We discussed her diagnosis, prognosis, GOC, EOL wishes disposition and options.  She states she would never want a feeding tube, and confirmed DNR/DNI status. She would like to treat the treatable. 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 Following this, she states " I don't want to die, Lord please don't let me die". She states "I'm 87, but I want to live". We discussed that medically, she does not appear to be iminently dying. She states she cannot think about death any further and begins to become tearful. Changed subject to discuss lighter topics, and again she states "Lord, please, I'm not ready to die". She states she is not afraid of the process, and states she knows where she is going, but she does not want to  die.   Referral made to chaplain service.       SUMMARY OF RECOMMENDATIONS   DNR/DNI States she would not want a feeding tube. Recommend palliative at D/C for further [place] conversations.     Prognosis:   Unable to determine  Discharge Planning: To Be Determined      Primary Diagnoses: Present on Admission: . TIA (transient ischemic attack) . Encephalopathy   I have reviewed the medical record, interviewed the patient and family, and examined the patient. The following aspects are pertinent.  Past Medical History:  Diagnosis Date  . Cancer [facility])    Right breast  . COPD (chronic obstructive pulmonary disease) ([place])   . Diabetes mellitus without complication ([place])   . Hypertension    Social History   Socioeconomic History  . Marital status: Widowed    Spouse name: Not on file  . Number of children: Not on file  . Years of education: Not on file  . Highest education level: Not on file  Occupational  History  . Not on file  Social Needs  . Financial resource strain: Not hard at all  . Food insecurity:    Worry: Never true    Inability: Never true  . Transportation needs:    Medical: No    Non-medical: No  Tobacco Use  . Smoking status: Former Research scientist (life sciences)  . Smokeless tobacco: Never Used  Substance and Sexual Activity  . Alcohol use: No  . Drug use: No  . Sexual activity: Not Currently  Lifestyle  . Physical activity:    Days per week: 0 days    Minutes per session: 0 min  . Stress: Not at all  Relationships  . Social connections:    Talks on phone: Once a week    Gets together: Once a week    Attends religious service: Never    Active member of club or organization: No    Attends meetings of clubs or organizations: Never    Relationship status: Widowed  Other Topics Concern  . Not on file  Social History Narrative   Lives alone in own apartment and uses a walker, still drives,or uses transport.   No family history on file. Scheduled Meds: .  aspirin EC  81 mg Oral Daily  . atorvastatin  40 mg Oral QHS  . citalopram  30 mg Oral Daily  . clopidogrel  75 mg Oral Daily  . enoxaparin (LOVENOX) injection  40 mg Subcutaneous Q24H  . gabapentin  100 mg Oral TID  . insulin aspart  0-5 Units Subcutaneous QHS  . insulin aspart  0-9 Units Subcutaneous TID WC  . mouth rinse  15 mL Mouth Rinse BID  . Melatonin  10 mg Oral QHS  . nystatin ointment  1 application Topical BID  . pantoprazole  40 mg Oral BID  . QUEtiapine  25 mg Oral QHS  . sucralfate  1 g Oral QID   Continuous Infusions: . sodium chloride 75 mL/hr at 08/27/18 0705   PRN Meds:.acetaminophen **OR** acetaminophen (TYLENOL) oral liquid 160 mg/5 mL **OR** acetaminophen, albuterol, hydrALAZINE, ondansetron (ZOFRAN) IV Medications Prior to Admission:  Prior to Admission medications   Medication Sig Start Date End Date Taking? Authorizing Provider  acetaminophen (TYLENOL) 500 MG tablet Take 1,000 mg by mouth every 8 (eight) hours as needed for mild pain or moderate pain.   Yes [provider]  albuterol (PROVENTIL HFA;VENTOLIN HFA) 108 (90 Base) MCG/ACT inhaler Inhale 2 puffs into the lungs every 4 (four) hours as needed for wheezing or shortness of breath. 02/26/17  Yes [provider]  aspirin EC 325 MG tablet Take 325 mg by mouth daily.   Yes [provider]  atorvastatin (LIPITOR) 40 MG tablet Take 40 mg by mouth at bedtime.    Yes [provider]  celecoxib (CELEBREX) 200 MG capsule Take 200 mg by mouth 2 (two) times daily.    Yes [provider]  citalopram (CELEXA) 20 MG tablet Take 30 mg by mouth daily.    Yes [provider]  fluconazole (DIFLUCAN) 150 MG tablet Take 150 mg by mouth once. (may repeat in 7 days if needed) 08/19/18  Yes [provider]  gabapentin (NEURONTIN) 100 MG capsule Take 100 mg by mouth at bedtime as needed for tremors. 04/13/18  Yes [provider]  gabapentin (NEURONTIN) 300 MG  capsule Take 300 mg by mouth 3 (three) times daily.    Yes [provider]  Melatonin 5 MG TABS Take 10 mg by mouth  at bedtime.   Yes [provider]  metFORMIN (GLUCOPHAGE) 500 MG tablet Take 500 mg by mouth 2 (two) times daily.    Yes [provider]  nystatin ointment (MYCOSTATIN) Apply 1 application topically 2 (two) times a day. 08/19/18  Yes [provider]  pantoprazole (PROTONIX) 40 MG tablet Take 1 tablet (40 mg total) by mouth 2 (two) times daily. 06/15/18  Yes Schuyler Amor, MD  sucralfate (CARAFATE) 1 g tablet Take 1 g by mouth 4 (four) times daily. 07/17/18  Yes [provider]  ondansetron (ZOFRAN) 4 MG tablet Take 1 tablet (4 mg total) by mouth every 8 (eight) hours as needed for nausea or vomiting. Patient not taking: Reported on 08/24/2018 06/15/18   Schuyler Amor, MD  sucralfate (CARAFATE) 1 GM/10ML suspension Take 10 mLs (1 g total) by mouth 4 (four) times daily -  with meals and at bedtime for 30 days. Patient not taking: Reported on 08/24/2018 06/15/18 07/15/18  Schuyler Amor, MD   Allergies  Allergen Reactions  . Morphine And Related Other (See Comments)    Reaction:  Hallucinations   . Codeine Nausea And Vomiting   Review of Systems  All other systems reviewed and are negative.   Physical Exam Pulmonary:     [place]: Pulmonary effort is normal.  Neurological:     Mental Status: She is alert.     Vital Signs: BP 124/70 (BP Location: Left Arm)   Pulse (!) 129   Temp 98.1 F (36.7 C)   Resp 19   Ht 5\' 6"  (1.676 m)   Wt 91 kg   SpO2 93%   BMI 32.38 kg/m  Pain Scale: 0-10 POSS *See Group Information*: S-Acceptable,Sleep, easy to arouse Pain Score: 10-Worst pain ever   SpO2: SpO2: 93 % O2 Device:SpO2: 93 % O2 Flow Rate: .   IO: Intake/output summary:   Intake/Output Summary (Last 24 hours) at 08/27/2018 1126 Last data filed at 08/27/2018 9326 Gross per 24 hour  Intake 0 ml  Output -  Net 0 ml    LBM:  Last BM Date: 08/25/18 Baseline Weight: Weight: 90.7 kg Most recent weight: Weight: 91 kg     Palliative Assessment/Data:   Flowsheet Rows     Most Recent Value  Intake Tab  Referral Department  Hospitalist  Unit at Time of Referral  Med/Surg Unit  Palliative Care Primary Diagnosis  Neurology  Date Notified  08/27/18  Palliative Care Type  New Palliative care  Reason for referral  Clarify Goals of Care, Non-pain Symptom  Date of Admission  08/23/18  # of days IP prior to Palliative referral  4  Clinical Assessment  Psychosocial & Spiritual Assessment  Palliative Care Outcomes      Time In: 9:40 Time Out:10:30 Time Total: 50 min Greater than 50%  of this time was spent counseling and coordinating care related to the above assessment and plan.  Signed by: Asencion Gowda, NP   Please contact Palliative Medicine Team phone at [phone] for questions and concerns.  For individual provider: See Shea Evans

## 2018-08-27 NOTE — Progress Notes (Signed)
Subjective: Patient continues to improve cognitively.  Awake and alert.    Objective: Current vital signs: BP 124/70 (BP Location: Left Arm)   Pulse (!) 129   Temp 98.1 F (36.7 C)   Resp 19   Ht 5\' 6"  (1.676 m)   Wt 91 kg   SpO2 93%   BMI 32.38 kg/m  Vital signs in last 24 hours: Temp:  [98.1 F (36.7 C)-99.7 F (37.6 C)] 98.1 F (36.7 C) (05/21 0419) Pulse Rate:  [59-129] 129 (05/21 0419) Resp:  [18-19] 19 (05/21 0419) BP: (124-141)/(63-70) 124/70 (05/21 0419) SpO2:  [93 %-96 %] 93 % (05/21 0419)  Intake/Output from previous day: 05/20 0701 - 05/21 0700 In: -  Out: 250 [Urine:250] Intake/Output this shift: No intake/output data recorded. Nutritional status:  Diet Order            DIET DYS 3 Room service appropriate? Yes; Fluid consistency: Thin  Diet effective now              Neurologic Exam: Mental Status: Alert, oriented ot person, place, month and year.  Thought content appropriate.  Speech fluent without evidence of aphasia.  Able to follow commands without difficulty. Cranial Nerves: II: Discs flat bilaterally; Visual fields grossly normal, pupils equal, round, reactive to light and accommodation III,IV, VI: ptosis not present, extra-ocular motions intact bilaterally V,VII: smile symmetric, facial light touch sensation normal bilaterally VIII: hearing normal bilaterally IX,X: gag reflex present XI: bilateral shoulder shrug XII: midline tongue extension Motor: Patient moves all extremities against gravity with no focal weakness noted   Lab Results: Basic Metabolic Panel: Recent Labs  Lab 08/23/18 2208 08/27/18 0358  NA 141 142  K 3.8 3.7  CL 107 109  CO2 22 24  GLUCOSE 183* 103*  BUN 19 18  CREATININE 1.11* 0.84  CALCIUM 9.3 8.9    Liver Function Tests: Recent Labs  Lab 08/23/18 2208  AST 21  ALT 11  ALKPHOS 83  BILITOT 0.6  PROT 6.9  ALBUMIN 3.8   No results for input(s): LIPASE, AMYLASE in the last 168 hours. No results for  input(s): AMMONIA in the last 168 hours.  CBC: Recent Labs  Lab 08/23/18 2208 08/27/18 0358  WBC 11.2* 10.1  HGB 11.7* 10.6*  HCT 39.4 34.4*  MCV 91.2 87.8  PLT 237 218    Cardiac Enzymes: No results for input(s): CKTOTAL, CKMB, CKMBINDEX, TROPONINI in the last 168 hours.  Lipid Panel: Recent Labs  Lab 08/25/18 0341 08/27/18 0358  CHOL 112 117  TRIG 52 75  HDL 42 33*  CHOLHDL 2.7 3.5  VLDL 10 15  LDLCALC 60 69    CBG: Recent Labs  Lab 08/25/18 1654 08/25/18 2105 08/26/18 1638 08/26/18 2121 08/27/18 0821  GLUCAP 131* 126* 130* 116* 102*    Microbiology: Results for orders placed or performed during the hospital encounter of 08/23/18  SARS Coronavirus 2 (CEPHEID - Performed in [facility] hospital lab), Hosp Order     Status: None   Collection Time: 08/24/18  8:35 AM  Result Value Ref Range Status   SARS Coronavirus 2 NEGATIVE NEGATIVE Final    Comment: (NOTE) If result is NEGATIVE SARS-CoV-2 target nucleic acids are NOT DETECTED. The SARS-CoV-2 RNA is generally detectable in upper and lower  respiratory specimens during the acute phase of infection. The lowest  concentration of SARS-CoV-2 viral copies this assay can detect is 250  copies / mL. A negative result does not preclude SARS-CoV-2 infection  and should  not be used as the sole basis for treatment or other  patient management decisions.  A negative result may occur with  improper specimen collection / handling, submission of specimen other  than nasopharyngeal swab, presence of viral mutation(s) within the  areas targeted by this assay, and inadequate number of viral copies  (<250 copies / mL). A negative result must be combined with clinical  observations, patient history, and epidemiological information. If result is POSITIVE SARS-CoV-2 target nucleic acids are DETECTED. The SARS-CoV-2 RNA is generally detectable in upper and lower  respiratory specimens dur ing the acute phase of infection.   Positive  results are indicative of active infection with SARS-CoV-2.  Clinical  correlation with patient history and other diagnostic information is  necessary to determine patient infection status.  Positive results do  not rule out bacterial infection or co-infection with other viruses. If result is PRESUMPTIVE POSTIVE SARS-CoV-2 nucleic acids MAY BE PRESENT.   A presumptive positive result was obtained on the submitted specimen  and confirmed on repeat testing.  While 2019 novel coronavirus  (SARS-CoV-2) nucleic acids may be present in the submitted sample  additional confirmatory testing may be necessary for epidemiological  and / or clinical management purposes  to differentiate between  SARS-CoV-2 and other Sarbecovirus currently known to infect humans.  If clinically indicated additional testing with an alternate test  methodology [phone]) is advised. The SARS-CoV-2 RNA is generally  detectable in upper and lower respiratory sp ecimens during the acute  phase of infection. The expected result is Negative. Fact Sheet for Patients:  StrictlyIdeas.no Fact Sheet for Healthcare Providers: BankingDealers.co.za This test is not yet approved or cleared by the [place] FDA and has been authorized for detection and/or diagnosis of SARS-CoV-2 by FDA under an Emergency Use Authorization (EUA).  This EUA will remain in effect (meaning this test can be used) for the duration of the COVID-19 declaration under Section 564(b)(1) of the Act, 21 U.S.C. section 360bbb-3(b)(1), unless the authorization is terminated or revoked sooner. Performed at [facility], [address] [place], [place] [address]     Coagulation Studies: No results for input(s): LABPROT, INR in the last 72 hours.  Imaging: Mr Jeri Cos Contrast  Result Date: 08/26/2018 CLINICAL DATA:  Follow-up examination for stroke. EXAM: MRI HEAD WITH CONTRAST TECHNIQUE:  Multiplanar, multiecho pulse sequences of the brain and surrounding structures were obtained with intravenous contrast. CONTRAST:  9 cc of Gadavist. COMPARISON:  Prior brain MRI from 08/24/2018 FINDINGS: Brain: Cerebral atrophy with chronic microvascular ischemic disease again noted. Now seen is a punctate 4 mm focus of diffusion abnormality involving the left parietal cortex (series 2, image 37), suspicious for a small acute ischemic infarct. Evaluation for associated hemorrhage limited on this postcontrast exam. No associated mass effect. No other evidence for acute or subacute ischemia. No visible mass lesion. No midline shift or mass effect. No hydrocephalus. No extra-axial fluid collection. No abnormal enhancement. Vascular: Grossly patent on this limited postcontrast exam. Skull and upper cervical spine: Negative. Sinuses/Orbits: Globes and orbital soft tissues within normal limits. Paranasal sinuses and mastoid air cells are largely clear. Other: None. IMPRESSION: 1. 4 mm focus of diffusion abnormality at the left parietal cortex, suspicious for a small acute ischemic infarct. 2. Otherwise stable appearance of the brain. No abnormal enhancement. Electronically Signed   By: Jeannine Boga M.D.   On: 08/26/2018 13:29   US Carotid Bilateral (at [facility] Only)  Result Date: 08/25/2018 CLINICAL DATA:  TIA EXAM: BILATERAL CAROTID DUPLEX ULTRASOUND TECHNIQUE: Pearline Cables scale imaging, color Doppler and duplex ultrasound were performed of bilateral carotid and vertebral arteries in the neck. COMPARISON:  09/15/2010 FINDINGS: Criteria: Quantification of carotid stenosis is based on velocity parameters that correlate the residual internal carotid diameter with NASCET-based stenosis levels, using the diameter of the distal internal carotid lumen as the denominator for stenosis measurement. The following velocity measurements were obtained: RIGHT ICA: 87 cm/sec CCA: 72 cm/sec SYSTOLIC ICA/CCA RATIO:  1.2 ECA: 136  cm/sec LEFT ICA: 93 cm/sec CCA: 73 cm/sec SYSTOLIC ICA/CCA RATIO:  1.3 ECA: 108 cm/sec RIGHT CAROTID ARTERY: Little if any plaque in the bulb. Low resistance internal carotid Doppler pattern. RIGHT VERTEBRAL ARTERY:  Antegrade. LEFT CAROTID ARTERY: Little if any plaque in the bulb. Mild calcified plaque in the lower internal carotid. Low resistance internal carotid Doppler pattern. LEFT VERTEBRAL ARTERY:  Antegrade. IMPRESSION: Less than 50% stenosis in the right and left internal carotid arteries. Electronically Signed   By: Marybelle Killings M.D.   On: 08/25/2018 11:55    Medications:  I have reviewed the patient's current medications. Scheduled: . aspirin EC  81 mg Oral Daily  . atorvastatin  40 mg Oral QHS  . citalopram  30 mg Oral Daily  . clopidogrel  75 mg Oral Daily  . enoxaparin (LOVENOX) injection  40 mg Subcutaneous Q24H  . gabapentin  100 mg Oral TID  . insulin aspart  0-5 Units Subcutaneous QHS  . insulin aspart  0-9 Units Subcutaneous TID WC  . mouth rinse  15 mL Mouth Rinse BID  . Melatonin  10 mg Oral QHS  . nystatin ointment  1 application Topical BID  . pantoprazole  40 mg Oral BID  . QUEtiapine  25 mg Oral QHS  . sucralfate  1 g Oral QID    Assessment/Plan: Patient continues to improve cognitively.  A1c 5.7.  LDL 69.  Thiamine level is normal.  Patient tolerating ASA and Plavix.  Recommendations: 1. No further neurologic intervention is recommended at this time.  If further questions arise, please call or page at that time.  Thank you for allowing neurology to participate in the care of this patient.  Patient to continue neurology follow up on an outpatient basis.     Alexis Goodell, MD Neurology [phone] 08/27/2018  10:34 AM     LOS: 2 days   Alexis Goodell, MD Neurology [phone] 08/27/2018  10:31 AM

## 2018-08-27 NOTE — Progress Notes (Signed)
OT Cancellation Note  Patient Details Name: Alexa Lowe MRN: [id number] DOB: 26-Jan-1931   Cancelled Treatment:    Reason Eval/Treat Not Completed: Patient declined, no reason specified. Upon attempt, pt sleeping but easily wakes. Pt requesting water. Confirmed in chart that pt is NPO and informed her. Pt politely declined a mouth swab. Pt stated, "if I can't get water soon, I don't know what I'm going to do." Reached out to SLP who plans to see pt soon for swallow evaluation. Will re-attempt OT evaluation at later time.  Jeni Salles, MPH, MS, OTR/L ascom [phone] 08/27/18, 8:59 AM

## 2018-08-27 NOTE — Evaluation (Signed)
Physical Therapy Evaluation Patient Details Name: Alexa Lowe MRN: [id number] DOB: [birth year] Today's Date: 08/27/2018   History of Present Illness  Pt. is an 85 y.o. female who was amitted to [facility]  with confusion, and impaired speech. Imaging revealed 4 mm focus of diffusion abnormality at the Left Parietal cortex suspicious for small acute infarct. Pt. PMHx includes: COPD, Type 2 DM, Breast CA, HTN, Acute Encephalopathy, TIA, COPD, Hyperlipidemia, and HTN   Clinical Impression  Upon evaluation, patient alert and oriented to self, general location; follows simple commands, but easily distracted, difficulty maintaining attention to task/conversation at times.  Bilat UE/LE strength and ROM grossly symmetrical and WFL; no focal weakness, sensory or coordination deficits appreciated.  Able to complete bed mobility with cga/close sup; sit/stand, basic transfers without assist device (does require UE support on armrests), cga; gait (20') with RW, cga/min assist.  Poor dynamic standing balance; requires RW and +1 assist at all times.  Patient does report manual WC as primary mobility at baseline (with short-distance gait within apartment). Would benefit from skilled PT to address above deficits and promote optimal return to PLOF; Recommend transition to [facility] upon discharge from acute hospitalization.      Follow Up Recommendations Home health PT    Equipment Recommendations  Rolling walker with 5" wheels    Recommendations for Other Services       Precautions / Restrictions Precautions Precautions: Fall Restrictions Weight Bearing Restrictions: No      Mobility  Bed Mobility Overal bed mobility: Needs Assistance Bed Mobility: Supine to Sit     Supine to sit: Min guard        Transfers Overall transfer level: Needs assistance   Transfers: Sit to/from Stand;Stand Pivot Transfers           General transfer comment: SPT without assist device, cga/min assist, does require UE  support on armrests throughout; sit/stand with RW, cga/min assist, UE support for lift off  Ambulation/Gait Ambulation/Gait assistance: Min assist Gait Distance (Feet): 20 Feet Assistive device: Rolling walker (2 wheeled)       General Gait Details: reciprocal stepping pattern with decreased step height/length bilat, choppy steps with limited balance reactions; mod reliance on RW; reports mild R knee pain with gait efforts.  Recommend use of RW and +1 at all times; does endorse manual WC as primary mobility at baseline.  Stairs            Wheelchair Mobility    Modified Rankin (Stroke Patients Only)       Balance Overall balance assessment: Needs assistance Sitting-balance support: No upper extremity supported;Feet supported Sitting balance-Leahy Scale: Good     Standing balance support: Bilateral upper extremity supported Standing balance-Leahy Scale: Poor                               Pertinent Vitals/Pain Pain Assessment: Faces Faces Pain Scale: Hurts little more Pain Location: R knee Pain Descriptors / Indicators: Aching;Grimacing;Guarding Pain Intervention(s): Limited activity within patient's tolerance;Monitored during session;Repositioned    Home Living Family/patient expects to be discharged to:: Private residence(senior living complex) Living Arrangements: Alone Available Help at Discharge: Family;Available PRN/intermittently Type of Home: Apartment       Home Layout: One level Home Equipment: Shower seat;Wheelchair - manual      Prior Function Level of Independence: Independent with assistive device(s)         Comments: Mod indep with ADLs, household mobility with manual  WC as primary mobility; SPT without assist device to/from seating surfaces.  Does ambulate short-distances (limited household) without assist device at times; denies fall history.     Hand Dominance   Dominant Hand: Right    Extremity/Trunk Assessment   Upper  Extremity Assessment Upper Extremity Assessment: Overall WFL for tasks assessed(grossly 4/5 throughout, symmetrical without focal weakness/sensory deficit reported)    Lower Extremity Assessment Lower Extremity Assessment: Overall WFL for tasks assessed(grossly 4/5 throughout, symmetrical without focal weakness/sensory deficit reported)       Communication   Communication: No difficulties(difficulty maintaining train of thought at times)  Cognition Arousal/Alertness: Awake/alert Behavior During Therapy: WFL for tasks assessed/performed Overall Cognitive Status: No family/caregiver present to determine baseline cognitive functioning Area of Impairment: Orientation                               General Comments: oriented to self, general location; follows simple commands; intermittently confused with difficulty maintaining topic of conversation at times      General Comments      Exercises Other Exercises Other Exercises: Sit/stand x2 with and without RW, cga/min assist-requires UE support on armrests or on RW to effectively complete all sit/stand and transfers   Assessment/Plan    PT Assessment Patient needs continued PT services  PT Problem List Decreased activity tolerance;Decreased balance;Decreased mobility;Decreased safety awareness;Decreased cognition;Decreased knowledge of use of DME       PT Treatment Interventions DME instruction;Gait training;Functional mobility training;Therapeutic activities;Cognitive remediation;Therapeutic exercise;Balance training;Neuromuscular re-education;Patient/family education    PT Goals (Current goals can be found in the Care Plan section)  Acute Rehab PT Goals Patient Stated Goal: to get another blanket PT Goal Formulation: With patient Time For Goal Achievement: 09/10/18 Potential to Achieve Goals: Fair    Frequency Min 2X/week   Barriers to discharge        Co-evaluation               AM-PAC PT "6 Clicks"  Mobility  Outcome Measure Help needed turning from your back to your side while in a flat bed without using bedrails?: A Little Help needed moving from lying on your back to sitting on the side of a flat bed without using bedrails?: A Little Help needed moving to and from a bed to a chair (including a wheelchair)?: A Little Help needed standing up from a chair using your arms (e.g., wheelchair or bedside chair)?: A Little Help needed to walk in hospital room?: A Little Help needed climbing 3-5 steps with a railing? : A Lot 6 Click Score: 17    End of Session Equipment Utilized During Treatment: Gait belt Activity Tolerance: Patient tolerated treatment well Patient left: in chair;with call bell/phone within reach;with chair alarm set Nurse Communication: Mobility status PT Visit Diagnosis: Muscle weakness (generalized) (M62.81);Difficulty in walking, not elsewhere classified (R26.2)    Time: 3220-2542 PT Time Calculation (min) (ACUTE ONLY): 19 min   Charges:   PT Evaluation $PT Eval Moderate Complexity: 1 Mod PT Treatments $Therapeutic Activity: 8-22 mins        Shadai Mcclane H. Owens Shark, PT, DPT, NCS 08/27/18, 3:35 PM [phone]

## 2018-08-28 DIAGNOSIS — K921 Melena: Secondary | ICD-10-CM

## 2018-08-28 LAB — HEMOGLOBIN AND HEMATOCRIT, BLOOD
HCT: 24.8 % — ABNORMAL LOW (ref 36.0–46.0)
HCT: 24.5 % — ABNORMAL LOW (ref 36.0–46.0)
Hemoglobin: 7.6 g/dL — ABNORMAL LOW (ref 12.0–15.0)
Hemoglobin: 7.5 g/dL — ABNORMAL LOW (ref 12.0–15.0)

## 2018-08-28 LAB — GLUCOSE, CAPILLARY
Glucose-Capillary: 118 mg/dL — ABNORMAL HIGH (ref 70–99)
Glucose-Capillary: 141 mg/dL — ABNORMAL HIGH (ref 70–99)
Glucose-Capillary: 128 mg/dL — ABNORMAL HIGH (ref 70–99)
Glucose-Capillary: 111 mg/dL — ABNORMAL HIGH (ref 70–99)

## 2018-08-28 MED ORDER — PANTOPRAZOLE SODIUM 40 MG IV SOLR: 40.0000 mg | Freq: Two times a day (BID) | INTRAVENOUS | Status: DC

## 2018-08-28 MED ORDER — SODIUM CHLORIDE 0.9 % IV SOLN
80.0000 mg | Freq: Once | INTRAVENOUS | Status: AC
  Administered 2018-08-28: 80 mg via INTRAVENOUS
  Filled 2018-08-28: qty 80

## 2018-08-28 MED ORDER — SODIUM CHLORIDE 0.9 % IV SOLN
8.0000 mg/h | INTRAVENOUS | Status: DC
  Administered 2018-08-28 – 2018-08-31 (×6): 8 mg/h via INTRAVENOUS
  Filled 2018-08-28 (×7): qty 80

## 2018-08-28 MED ORDER — CLOPIDOGREL BISULFATE 75 MG PO TABS: 75.0000 mg | ORAL_TABLET | Freq: Every day | ORAL | 0 refills | Status: DC

## 2018-08-28 MED ORDER — SODIUM CHLORIDE 0.9 % IV SOLN: 8.0000 mg/h | INTRAVENOUS | Status: DC

## 2018-08-28 MED ORDER — ASPIRIN 81 MG PO TBEC: 81.0000 mg | DELAYED_RELEASE_TABLET | Freq: Every day | ORAL | 0 refills | Status: DC

## 2018-08-28 MED ORDER — GABAPENTIN 100 MG PO CAPS: 100.0000 mg | ORAL_CAPSULE | Freq: Three times a day (TID) | ORAL | 0 refills | Status: DC

## 2018-08-28 NOTE — Progress Notes (Signed)
   08/28/18 1400  Clinical Encounter Type  Visited With Patient  Visit Type Initial  Referral From Palliative care team  Consult/Referral To Chaplain   Chaplain received a referral from Asencion Gowda, NP with the Palliative Care Team. Upon arrival, the patient was observed to be sleeping with the lights out. She was pleasant, but acknowledged that she was asleep and would appreciate a later visit.

## 2018-08-28 NOTE — Consult Note (Signed)
Vonda Antigua, MD [address] [place], [place], [place], [address] [address], [place], [place], [place], [address] Phone: [phone]  Fax: [phone]  Consultation  Referring Provider:     Dr. Estanislado Pandy Primary Care Physician:  Dion Body, MD Reason for Consultation:     GI bleed  Date of Admission:  08/23/2018 Date of Consultation:  08/28/2018         HPI:   Alexa Lowe is a 85 y.o. female admitted with altered mental status and found to have acute stroke, and is on aspirin and Plavix due to this.  GI is being consulted due to rectal bleeding.  Last dose of Plavix was this morning.  Hemoglobin dropped from 10.6 yesterday to 7.6 this morning.  No hematemesis.  No melena.  Patient only had one episode consisting of bright red blood. This was 2-3 hours ago.   Patient follows with [facility] GI, Dr. Alice Reichert, who did an upper endoscopy in February 2020 due to anemia and melena.  A nonbleeding gastric antral ulcer was noted at the time.  Patient was on Celebrex and high-dose aspirin at the time. States she continued to take the Celebrex after the EGD.   Last colonoscopy was in 2005 which showed two 5 mm polyps that were removed.  I do not have the pathology report from this.  Procedure report available in provation  Past Medical History:  Diagnosis Date  . Cancer [facility])    Right breast  . COPD (chronic obstructive pulmonary disease) ([place])   . Diabetes mellitus without complication ([place])   . Hypertension     Past Surgical History:  Procedure Laterality Date  . ABDOMINAL SURGERY    . APPENDECTOMY    . BACK SURGERY    . BREAST SURGERY    . CHOLECYSTECTOMY    . ESOPHAGOGASTRODUODENOSCOPY (EGD) WITH PROPOFOL N/A 05/20/2018   Procedure: ESOPHAGOGASTRODUODENOSCOPY (EGD) WITH PROPOFOL;  Surgeon: Toledo, Benay Pike, MD;  Location: [facility] ENDOSCOPY;  Service: Gastroenterology;  Laterality: N/A;    Prior to Admission medications   Medication Sig Start Date End  Date Taking? Authorizing Provider  acetaminophen (TYLENOL) 500 MG tablet Take 1,000 mg by mouth every 8 (eight) hours as needed for mild pain or moderate pain.   Yes [provider]  albuterol (PROVENTIL HFA;VENTOLIN HFA) 108 (90 Base) MCG/ACT inhaler Inhale 2 puffs into the lungs every 4 (four) hours as needed for wheezing or shortness of breath. 02/26/17  Yes [provider]  aspirin EC 325 MG tablet Take 325 mg by mouth daily.   Yes [provider]  atorvastatin (LIPITOR) 40 MG tablet Take 40 mg by mouth at bedtime.    Yes [provider]  celecoxib (CELEBREX) 200 MG capsule Take 200 mg by mouth 2 (two) times daily.    Yes [provider]  citalopram (CELEXA) 20 MG tablet Take 30 mg by mouth daily.    Yes [provider]  fluconazole (DIFLUCAN) 150 MG tablet Take 150 mg by mouth once. (may repeat in 7 days if needed) 08/19/18  Yes [provider]  gabapentin (NEURONTIN) 100 MG capsule Take 100 mg by mouth at bedtime as needed for tremors. 04/13/18  Yes [provider]  gabapentin (NEURONTIN) 300 MG capsule Take 300 mg by mouth 3 (three) times daily.    Yes [provider]  Melatonin 5 MG TABS Take 10 mg by mouth at bedtime.   Yes [provider]  metFORMIN (GLUCOPHAGE) 500 MG  tablet Take 500 mg by mouth 2 (two) times daily.    Yes [provider]  nystatin ointment (MYCOSTATIN) Apply 1 application topically 2 (two) times a day. 08/19/18  Yes [provider]  pantoprazole (PROTONIX) 40 MG tablet Take 1 tablet (40 mg total) by mouth 2 (two) times daily. 06/15/18  Yes Schuyler Amor, MD  sucralfate (CARAFATE) 1 g tablet Take 1 g by mouth 4 (four) times daily. 07/17/18  Yes [provider]  aspirin EC 81 MG EC tablet Take 1 tablet (81 mg total) by mouth daily for 30 days. 08/29/18 09/28/18  Saundra Shelling, MD  clopidogrel (PLAVIX) 75 MG tablet Take 1 tablet (75 mg total) by mouth daily for 30  days. 08/29/18 09/28/18  Saundra Shelling, MD  gabapentin (NEURONTIN) 100 MG capsule Take 1 capsule (100 mg total) by mouth 3 (three) times daily for 30 days. 08/28/18 09/27/18  Saundra Shelling, MD  ondansetron (ZOFRAN) 4 MG tablet Take 1 tablet (4 mg total) by mouth every 8 (eight) hours as needed for nausea or vomiting. Patient not taking: Reported on 08/24/2018 06/15/18   Schuyler Amor, MD  sucralfate (CARAFATE) 1 GM/10ML suspension Take 10 mLs (1 g total) by mouth 4 (four) times daily -  with meals and at bedtime for 30 days. Patient not taking: Reported on 08/24/2018 06/15/18 07/15/18  Schuyler Amor, MD    No family history on file.   Social History   Tobacco Use  . Smoking status: Former Research scientist (life sciences)  . Smokeless tobacco: Never Used  Substance Use Topics  . Alcohol use: No  . Drug use: No    Allergies as of 08/23/2018 - Review Complete 08/23/2018  Allergen Reaction Noted  . Morphine and related Other (See Comments) 10/28/2014  . Codeine Nausea And Vomiting 10/28/2014    Review of Systems:    All systems reviewed and negative except where noted in HPI.   Physical Exam:  Vital signs in last 24 hours: Vitals:   08/27/18 2147 08/28/18 0526 08/28/18 0926 08/28/18 1250  BP: (!) 149/55 (!) 125/49 (!) 132/54 (!) 114/53  Pulse: (!) 58 68 73   Resp: 16 16 16 17   Temp: 98.3 F (36.8 C) 98.8 F (37.1 C) 98.4 F (36.9 C) 98.3 F (36.8 C)  TempSrc: Oral Oral Oral Oral  SpO2: 97% 93% 95% 97%  Weight:      Height:       Last BM Date: 08/26/18(per patient ) General:   Pleasant, cooperative in NAD Head:  Normocephalic and atraumatic. Eyes:   No icterus.   Conjunctiva pink. PERRLA. Ears:  Normal auditory acuity. Neck:  Supple; no masses or thyroidomegaly Lungs: Respirations even and unlabored. Lungs clear to auscultation bilaterally.   No wheezes, crackles, or rhonchi.  Abdomen:  Soft, nondistended, nontender. Normal bowel sounds. No appreciable masses or hepatomegaly.  No rebound or  guarding.  Neurologic:  Alert and oriented x3;  grossly normal neurologically. Skin:  Intact without significant lesions or rashes. Cervical Nodes:  No significant cervical adenopathy. Psych:  Alert and cooperative. Normal affect.  LAB RESULTS: Recent Labs    08/27/18 0358 08/28/18 1418  WBC 10.1  --   HGB 10.6* 7.6*  HCT 34.4* 24.8*  PLT 218  --    BMET Recent Labs    08/27/18 0358  NA 142  K 3.7  CL 109  CO2 24  GLUCOSE 103*  BUN 18  CREATININE 0.84  CALCIUM 8.9   LFT No results for  input(s): PROT, ALBUMIN, AST, ALT, ALKPHOS, BILITOT, BILIDIR, IBILI in the last 72 hours. PT/INR No results for input(s): LABPROT, INR in the last 72 hours.  STUDIES: No results found.    Impression / Plan:   Alexa Lowe is a 85 y.o. y/o female with acute stroke on this admission, started on aspirin and Plavix and GI being consulted for hematochezia  When Dr. Estanislado Pandy informed me about the consult today, given that patient is very high risk for endoscopic procedures due to her acute stroke on this admission, and took on Plavix today, I recommended to him that patient should have an RBC scan or CTA due to the active bleeding.  He stated that the bleeding episode only occurred once and did not recur and therefore did not feel the need for the RBC scan or CTA at this time.  Patient's hematochezia may be due to lower GI bleed from hemorrhoids, diverticulosis, or ischemic colitis.  However, upper GI bleed can also be possible given her history of gastric ulcer, and her celebrex use at home recently  Patient is at high risk for endoscopic procedures that require sedation given that she has had an acute stroke within this admission.  In addition, Plavix will need to be held for 3 to 5 days prior to the endoscopy, and last dose was today.  If bleeding reoccurs I would strongly recommend obtaining RBC scan or CTA at the time of the bleeding to localize the source and consult vascular surgery  for embolization if positive.   PPI IV twice daily  Continue serial CBCs and transfuse PRN Avoid NSAIDs Maintain 2 large-bore IV lines Please page GI with any acute hemodynamic changes, or signs of active GI bleeding  Dr. Marius Ditch will be following the patient over the weekend  Thank you for involving me in the care of this patient.      LOS: 3 days   Virgel Manifold, MD  08/28/2018, 2:56 PM

## 2018-08-28 NOTE — Progress Notes (Signed)
Daily Progress Note   Patient Name: Alexa Lowe       Date: 08/28/2018 DOB: March 26, 1931  Age: 85 y.o. MRN#: [id number] Attending Physician: Saundra Shelling, MD Primary Care Physician: Dion Body, MD Admit Date: 08/23/2018  Reason for Consultation/Follow-up: Psychosocial/spiritual support  Subjective: Patient resting in bed. No complaints.  She is happy to be discharged today. Recommend palliative at D/C.     Length of Stay: 3  Current Medications: Scheduled Meds:  . aspirin EC  81 mg Oral Daily  . atorvastatin  40 mg Oral QHS  . citalopram  30 mg Oral Daily  . clopidogrel  75 mg Oral Daily  . enoxaparin (LOVENOX) injection  40 mg Subcutaneous Q24H  . gabapentin  100 mg Oral TID  . mouth rinse  15 mL Mouth Rinse BID  . Melatonin  10 mg Oral QHS  . nystatin ointment  1 application Topical BID  . pantoprazole  40 mg Oral BID  . QUEtiapine  25 mg Oral QHS  . sucralfate  1 g Oral QID    Continuous Infusions: . sodium chloride 75 mL/hr at 08/28/18 0714    PRN Meds: acetaminophen **OR** acetaminophen (TYLENOL) oral liquid 160 mg/5 mL **OR** acetaminophen, albuterol, hydrALAZINE, ondansetron (ZOFRAN) IV  Physical Exam Pulmonary:     Effort: Pulmonary effort is normal.  Neurological:     Mental Status: She is alert.             Vital Signs: BP (!) 132/54 (BP Location: Right Arm)   Pulse 73   Temp 98.4 F (36.9 C) (Oral)   Resp 16   Ht 5\' 6"  (1.676 m)   Wt 91 kg   SpO2 95%   BMI 32.38 kg/m  SpO2: SpO2: 95 % O2 Device: O2 Device: Room Air O2 Flow Rate:    Intake/output summary:   Intake/Output Summary (Last 24 hours) at 08/28/2018 1152 Last data filed at 08/27/2018 1612 Gross per 24 hour  Intake 576.25 ml  Output 300 ml  Net 276.25 ml   LBM: Last BM  Date: 08/26/18(per patient ) Baseline Weight: Weight: 90.7 kg Most recent weight: Weight: 91 kg       Palliative Assessment/Data:    Flowsheet Rows     Most Recent Value  Intake Tab  Referral Department  Hospitalist  Unit  at Time of Referral  Med/Surg Unit  Palliative Care Primary Diagnosis  Neurology  Date Notified  08/27/18  Palliative Care Type  New Palliative care  Reason for referral  Clarify Goals of Care, Non-pain Symptom  Date of Admission  08/23/18  # of days IP prior to Palliative referral  4  Clinical Assessment  Psychosocial & Spiritual Assessment  Palliative Care Outcomes      Patient Active Problem List   Diagnosis Date Noted  . Encephalopathy 08/25/2018  . TIA (transient ischemic attack) 08/24/2018  . Symptomatic anemia 05/19/2018  . HTN (hypertension) 05/19/2018  . Diabetes ([place]) 05/19/2018  . COPD (chronic obstructive pulmonary disease) ([place]) 05/19/2018    Palliative Care Assessment & Plan    Recommendations/Plan:  Recommend palliative to follow at D/C.   Code Status:    Code Status Orders  (From admission, onward)         Start     Ordered   08/24/18 1444  Do not attempt resuscitation (DNR)  Continuous    Question Answer Comment  In the event of cardiac or respiratory ARREST Do not call a "code blue"   In the event of cardiac or respiratory ARREST Do not perform Intubation, CPR, defibrillation or ACLS   In the event of cardiac or respiratory ARREST Use medication by any route, position, wound care, and other measures to relive pain and suffering. May use oxygen, suction and manual treatment of airway obstruction as needed for comfort.      08/24/18 1443        Code Status History    Date Active Date Inactive Code Status Order ID Comments User Context   05/20/2018 1950 05/21/2018 1834 DNR [id number]  Dustin Flock, MD Inpatient   05/20/2018 0006 05/20/2018 1219 Full Code [id number]  Lance Coon, MD Inpatient       Prognosis:    Unable to determine  Discharge Planning:  Home with [place] was discussed with CM  Thank you for allowing the Palliative Medicine Team to assist in the care of this patient.   Total Time 15 min Prolonged Time Billed  NO      Greater than 50%  of this time was spent counseling and coordinating care related to the above assessment and plan.  Asencion Gowda, NP  Please contact Palliative Medicine Team phone at [phone] for questions and concerns.

## 2018-08-28 NOTE — Progress Notes (Addendum)
[place] [facility] [place] NAME: Alexa Lowe    MR#:  [id number]  DATE OF BIRTH:  December 16, 1930  SUBJECTIVE:  CHIEF COMPLAINT:   Chief Complaint  Patient presents with  . Altered Mental Status  Patient seen today More awake and responds to verbal commands Better oriented No chest pain No shortness of breath Patient had an episode of  rectal bleed today Currently on aspirin and Plavix  REVIEW OF SYSTEMS:    ROS Review of Systems  Constitutional: generalized weakness  HENT: Negative.   Eyes: Negative.   Respiratory: Negative.   Cardiovascular: Negative.   Gastrointestinal: Has rectal bleed Genitourinary: Negative.   Musculoskeletal: Negative.   Skin: Negative.   Neurological: speech better  Endo/Heme/Allergies: Negative.   Psychiatric/Behavioral: Negative.   All other systems reviewed and are negative.  DRUG ALLERGIES:   Allergies  Allergen Reactions  . Morphine And Related Other (See Comments)    Reaction:  Hallucinations   . Codeine Nausea And Vomiting    VITALS:  Blood pressure (!) 114/53, pulse 73, temperature 98.3 F (36.8 C), temperature source Oral, resp. rate 17, height 5\' 6"  (1.676 m), weight 91 kg, SpO2 97 %.  PHYSICAL EXAMINATION:   Physical Exam  GENERAL:  85 y.o.-year-old patient lying in the bed  EYES: Pupils equal, round, reactive to light and accommodation. No scleral icterus. Extraocular muscles intact.  HEENT: Head atraumatic, normocephalic. Oropharynx dry and nasopharynx clear.  NECK:  Supple, no jugular venous distention. No thyroid enlargement, no tenderness.  LUNGS: Normal breath sounds bilaterally, no wheezing, rales, rhonchi. No use of accessory muscles of respiration.  CARDIOVASCULAR: S1, S2 normal. No murmurs, rubs, or gallops.  ABDOMEN: Soft, nontender, nondistended. Bowel sounds present. No organomegaly or mass.  EXTREMITIES: No cyanosis, clubbing or edema b/l.    NEUROLOGIC: Awake , responds to  all verbal commands Moves all extremities PSYCHIATRIC: The patient is alert and oriented x 2 SKIN: No obvious rash, lesion, or ulcer.   LABORATORY PANEL:   CBC Recent Labs  Lab 08/27/18 0358  WBC 10.1  HGB 10.6*  HCT 34.4*  PLT 218   ------------------------------------------------------------------------------------------------------------------ Chemistries  Recent Labs  Lab 08/23/18 2208 08/27/18 0358  NA 141 142  K 3.8 3.7  CL 107 109  CO2 22 24  GLUCOSE 183* 103*  BUN 19 18  CREATININE 1.11* 0.84  CALCIUM 9.3 8.9  AST 21  --   ALT 11  --   ALKPHOS 83  --   BILITOT 0.6  --    ------------------------------------------------------------------------------------------------------------------  Cardiac Enzymes No results for input(s): TROPONINI in the last 168 hours. ------------------------------------------------------------------------------------------------------------------  RADIOLOGY:  No results found.   ASSESSMENT AND PLAN:  85 year old female patient with history of COPD, type 2 diabetes mellitus, breast cancer, hypertension currently under hospitalist service for altered mental status  -Acute gastrointestinal bleeding Serial hemoglobin hematocrit monitoring N.p.o. for now IV Protonix drip Gastroenterology consultation Hold aspirin and Plavix  -Acute CVA Repeat MRI brain reveals small CVA Neurology follow-up appreciated Carotid ultrasound less than 50% stenosis Check for EEG if patient cooperates Echocardiogram reviewed Aspirin and Plavix will be held secondary to GI bleed Speech therapy follow-up will do swallow study and recommend diet Appreciate neurology follow-up Lumbar puncture deferred for now  -Nausea and vomiting Improved Restart diet after speech therapy recommendations Currently n.p.o. IV fluids  -COPD Continue nebulization therapy  -Hypertension Switched to IV meds for better control of blood pressure  -DVT prophylaxis  subcu Lovenox daily  -  Type 2 diabetes mellitus Sliding scale coverage with insulin  -Family updated  All the records are reviewed and case discussed with Care Management/Social Worker. Management plans discussed with the patient, family and they are in agreement.  CODE STATUS: DNR  DVT Prophylaxis: SCDs  TOTAL TIME TAKING CARE OF THIS PATIENT: 40 minutes.   POSSIBLE D/C IN  2 to 3 DAYS, DEPENDING ON CLINICAL CONDITION.  Saundra Shelling M.D on 08/28/2018 at 1:22 PM  Between 7am to 6pm - Pager - [phone]  After 6pm go to [email or web address] - password [email or web address] [facility]  Office  [phone]  CC: Primary care physician; Dion Body, MD  Note: This dictation was prepared with Dragon dictation along with smaller phrase technology. Any transcriptional errors that result from this process are unintentional.

## 2018-08-28 NOTE — TOC Transition Note (Signed)
Transition of Care [facility]) - CM/SW Discharge Note   Patient Details  Name: Alexa Lowe MRN: [id number] Date of Birth: January 05, 1931  [facility] CM/SW Contact:  Annamaria Boots, [place] Phone Number: 08/28/2018, 11:09 AM   Clinical Narrative:   Patient is medically ready for discharge today. CSW spoke with patient and daughter Atha Starks, both are in agreement with discharge today. Patient will discharge home with home health. Patient chose [facility] home health due to insurance. Daughter states she will pick up patient today and plans to stay with patient for a few weeks. CSW notified [place] at [facility] of referral and discharge today.     Final next level of care: [place] Barriers to Discharge: No Barriers Identified   Patient Goals and CMS Choice Patient states their goals for this hospitalization and ongoing recovery are:: return home  CMS Medicare.gov Compare Post Acute Care list provided to:: Patient Choice offered to / list presented to : Patient  Discharge Placement                  Name of family member notified: Atha Starks- daughter  Patient and family notified of of transfer: 08/28/18  Discharge Plan and Services   Discharge Planning Services: CM Consult            DME Arranged: Gilford Rile rolling DME Agency: [employer] Date DME Agency Contacted: 08/28/18 Time DME Agency Contacted: [phone] Representative spoke with at DME Agency: [place]  HH Arranged: RN, PT, OT, Nurse's Aide, Social Work [facility] Agency: [facility] Date [place]: 08/28/18 Time [place]: 1109 Representative spoke with at [place]: [place]) Interventions     Readmission Risk Interventions No flowsheet data found.

## 2018-08-28 NOTE — Care Management Important Message (Signed)
Important Message  Patient Details  Name: Alexa Lowe MRN: [id number] Date of Birth: Dec 27, 1930   Medicare Important Message Given:  Yes    Juliann Pulse A Mirtha Jain 08/28/2018, 10:47 AM

## 2018-08-29 ENCOUNTER — Inpatient Hospital Stay: Payer: Medicare HMO

## 2018-08-29 ENCOUNTER — Encounter: Payer: Self-pay | Admitting: Radiology

## 2018-08-29 DIAGNOSIS — D62 Acute posthemorrhagic anemia: Secondary | ICD-10-CM

## 2018-08-29 LAB — BASIC METABOLIC PANEL
Anion gap: 5 (ref 5–15)
BUN: 39 mg/dL — ABNORMAL HIGH (ref 8–23)
CO2: 21 mmol/L — ABNORMAL LOW (ref 22–32)
Calcium: 7.8 mg/dL — ABNORMAL LOW (ref 8.9–10.3)
Chloride: 115 mmol/L — ABNORMAL HIGH (ref 98–111)
Creatinine, Ser: 0.94 mg/dL (ref 0.44–1.00)
GFR calc Af Amer: >60 mL/min (ref >60)
GFR calc non Af Amer: 55 mL/min — ABNORMAL LOW (ref >60)
Glucose, Bld: 98 mg/dL (ref 70–99)
Potassium: 3.6 mmol/L (ref 3.5–5.1)
Sodium: 141 mmol/L (ref 135–145)

## 2018-08-29 LAB — FOLATE: Folate: 13.2 ng/mL

## 2018-08-29 LAB — HEMOGLOBIN AND HEMATOCRIT, BLOOD
HCT: 20.6 % — ABNORMAL LOW (ref 36.0–46.0)
HCT: 28.5 % — ABNORMAL LOW (ref 36.0–46.0)
Hemoglobin: 6.4 g/dL — ABNORMAL LOW (ref 12.0–15.0)
Hemoglobin: 9.3 g/dL — ABNORMAL LOW (ref 12.0–15.0)

## 2018-08-29 LAB — GLUCOSE, CAPILLARY
Glucose-Capillary: 127 mg/dL — ABNORMAL HIGH (ref 70–99)
Glucose-Capillary: 171 mg/dL — ABNORMAL HIGH (ref 70–99)
Glucose-Capillary: 119 mg/dL — ABNORMAL HIGH (ref 70–99)
Glucose-Capillary: 135 mg/dL — ABNORMAL HIGH (ref 70–99)

## 2018-08-29 LAB — IRON AND TIBC
Iron: 210 ug/dL — ABNORMAL HIGH (ref 28–170)
Saturation Ratios: 86 % — ABNORMAL HIGH (ref 10.4–31.8)
TIBC: 246 ug/dL — ABNORMAL LOW (ref 250–450)
UIBC: 36 ug/dL

## 2018-08-29 LAB — FERRITIN: Ferritin: 12 ng/mL (ref 11–307)

## 2018-08-29 LAB — PREPARE RBC (CROSSMATCH)

## 2018-08-29 MED ORDER — SODIUM CHLORIDE 0.9% FLUSH
3.0000 mL | INTRAVENOUS | Status: DC | PRN
  Administered 2018-08-29 – 2018-08-30 (×2): 3 mL via INTRAVENOUS
  Filled 2018-08-29 (×2): qty 3

## 2018-08-29 MED ORDER — IOPAMIDOL (ISOVUE-370) INJECTION 76%
100.0000 mL | Freq: Once | INTRAVENOUS | Status: AC | PRN
  Administered 2018-08-29: 15:00:00 100 mL via INTRAVENOUS

## 2018-08-29 MED ORDER — SODIUM CHLORIDE 0.9 % IV SOLN
300.0000 mg | Freq: Once | INTRAVENOUS | Status: AC
  Administered 2018-08-30: 300 mg via INTRAVENOUS
  Filled 2018-08-29: qty 15

## 2018-08-29 MED ORDER — SODIUM CHLORIDE 0.9% IV SOLUTION
Freq: Once | INTRAVENOUS | Status: AC
  Administered 2018-08-29: 09:00:00 via INTRAVENOUS

## 2018-08-29 MED ORDER — SODIUM CHLORIDE 0.9 % IV SOLN
510.0000 mg | Freq: Once | INTRAVENOUS | Status: AC
  Administered 2018-08-29: 17:00:00 510 mg via INTRAVENOUS
  Filled 2018-08-29: qty 17

## 2018-08-29 MED ORDER — SODIUM CHLORIDE 0.9% FLUSH
3.0000 mL | Freq: Two times a day (BID) | INTRAVENOUS | Status: DC
  Administered 2018-08-29 – 2018-08-31 (×5): 3 mL via INTRAVENOUS

## 2018-08-29 NOTE — Progress Notes (Addendum)
[facility] NAME: Alexa Lowe    MR#:  [id number]  DATE OF BIRTH:  07/16/1930  SUBJECTIVE:   Chief Complaint  Patient presents with  . Altered Mental Status   Patient arouses to verbal and tactile stimuli only. She is able to respond and follow commands however appears pale and lethargic.  Patient is noted to have an episode of black tarry loose stool on examination. Hgb dropped this am to 6.4 requiring blood transfusion. She is currently getting 2 units of PRBCs.  REVIEW OF SYSTEMS:   Unable to obtain from patient due to current altered mental status.  DRUG ALLERGIES:   Allergies  Allergen Reactions  . Morphine And Related Other (See Comments)    Reaction:  Hallucinations   . Codeine Nausea And Vomiting   VITALS:  Blood pressure (!) 128/41, pulse 70, temperature 97.7 F (36.5 C), resp. rate 16, height 5\' 6"  (1.676 m), weight 91 kg, SpO2 98 %. PHYSICAL EXAMINATION:   GENERAL:  85 y.o.-year-old patient lying in the bed with no acute distress.  EYES: Pupils equal, round, reactive to light and accommodation. No scleral icterus. Extraocular muscles intact.  HEENT: Head atraumatic, normocephalic. Oropharynx and nasopharynx clear.  NECK:  Supple, no jugular venous distention. No thyroid enlargement, no tenderness.  LUNGS: Normal breath sounds bilaterally, no wheezing, rales,rhonchi or crepitation. No use of accessory muscles of respiration.  CARDIOVASCULAR: S1, S2 normal. No murmurs, rubs, or gallops.  ABDOMEN: Soft, nontender, nondistended. Bowel sounds present. No organomegaly or mass.  EXTREMITIES: No pedal edema, cyanosis, or clubbing.  NEUROLOGIC: Cranial nerves II through XII are intact. Muscle strength 5/5 in all extremities. Sensation intact. Gait not checked.  PSYCHIATRIC: The patient is alert and oriented x 3.  SKIN: No obvious rash, lesion, or ulcer.   DATA REVIEWED:  LABORATORY PANEL:  Female CBC Recent Labs   Lab 08/27/18 0358  08/29/18 0139  WBC 10.1  --   --   HGB 10.6*   < > 6.4*  HCT 34.4*   < > 20.6*  PLT 218  --   --    < > = values in this interval not displayed.   ------------------------------------------------------------------------------------------------------------------ Chemistries  Recent Labs  Lab 08/23/18 2208  08/29/18 0139  NA 141   < > 141  K 3.8   < > 3.6  CL 107   < > 115*  CO2 22   < > 21*  GLUCOSE 183*   < > 98  BUN 19   < > 39*  CREATININE 1.11*   < > 0.94  CALCIUM 9.3   < > 7.8*  AST 21  --   --   ALT 11  --   --   ALKPHOS 83  --   --   BILITOT 0.6  --   --    < > = values in this interval not displayed.   RADIOLOGY:  No results found. ASSESSMENT AND PLAN:   85 y.o. female  presenting with altered mental status found to have acute ischemic stroke on MRI brain. Patient started on DUAP however was noted to with an episode of hematochezia.  1. Acute GI bleed - Likely Lower GI bleed without evidence of hemodynamic instability. Patient with new episode of black tarry stools noted this am. - Recent EGD on 05/2018 due to anemia and melena showed a nonbleeding gastric antral ulcer. Per GI at high risk for post procedure complication from sedation  given recent AMS and stroke. - Hgb today 6.4, transfusing  - H&H monitoring q6h - Pantoprazole 80mg  IV gtt 8mg /hr - Will obtain CT angio to isolate source and possible IR Consult for embolization if no improvement - Hold NSAIDs, steroids, ASA - GI following  2. Acute CVA - brain reviewed and shows punctate 4 mm focus of diffusion abnormality involving the left parietal cortex - Patient was on DUAP however this is being held due to recent acute GI bleed - US carotids shows no hemodynamically significant stenosis - Echocardiogram shows no cardiac source of emboli - Continue statin with goal LDL < 70 mg/dl - PT consult, OT consult, Speech consult - Neuro checks per protocol - Neurology following  3. Altered  mental status - Likely due to recent acute CVA. Mental status now improving - Continue to monitor mental status - Will avoid Benzos - LP deferred due to improving mental status   4. Type 2 diabetes mellitus without complication, without long-term current use of insulin (A1c 5.7% - 08/27/18) - Well controlled. No changes to current regimen. - Low intensity sliding scale insulin coverage - ADA 2100 calorie diet  - FS BS qac and qhs  5. Essential hypertension - controlled - Prn Hydralazine  6. COPD - No evidence of exacerbation - Continue DuoNeb PRN  7.  DVT prophylaxis -Lovenox held due to recent GI bleed  Patients' daughter updated on plan of care and current progress.  All the records are reviewed and case discussed with Care Management/Social Worker. Management plans discussed with the patient, family and they are in agreement.  CODE STATUS: DNR  TOTAL TIME TAKING CARE OF THIS PATIENT: 40 minutes.   More than 50% of the time was spent in counseling/coordination of care: YES  POSSIBLE D/C IN 3 DAYS, DEPENDING ON CLINICAL CONDITION.   on 08/29/2018 at 8:02 AM  This patient was staffed with Dr. Vaughan Basta who personally evaluated patient, reviewed documentation and agreed with assessment and plan of care as above.  Rufina Falco, DNP, FNP-BC [facility] Nurse Practitioner   Between 7am to 6pm - Pager [phone]  After 6pm go to [email or web address] - Proofreader  [facility]  Office  [phone]  CC: Primary care physician; Dion Body, MD  Note: This dictation was prepared with Dragon dictation along with smaller phrase technology. Any transcriptional errors that result from this process are unintentional.

## 2018-08-29 NOTE — Progress Notes (Signed)
Pt receiving her 2nd unit of PRBC's and tolerating well. Pt accompanied by Naoma Diener supervisor for scans with blood transfusing.  VSS. 1 small blackish/green loose stool at shift change this a.m.  VSS. Eating only small amount/tolerating sips of water well. Demonstrates some memory recall limitation. Abdomen tender/hyperactive BS's/ not distended.

## 2018-08-29 NOTE — Progress Notes (Signed)
OT Cancellation Note  Patient Details Name: Alexa Lowe MRN: [id number] DOB: Apr 05, 1931   Cancelled Treatment:    Reason Eval/Treat Not Completed: Medical issues which prohibited therapy. Hgb noted to be 6.4 this am and therefore inappropriate for therapy. Order for transfusion noted in chart but not initiated yet. Will hold OT treatment at this time and re-attempt at later date/time as pt is medically appropriate.   Jeni Salles, MPH, MS, OTR/L ascom [phone] 08/29/18, 8:22 AM

## 2018-08-29 NOTE — Progress Notes (Signed)
Alexa Darby, MD [address]  [place]  [place], [place] [address]  Main: [phone]  Fax: [phone] Pager: [phone]   Subjective: Patient's hemoglobin dropped from 7.5 to 6.4 today.  In the morning, she had blackish green bowel movement.  She had 1 burgundy colored, liquid bowel movement around 2:30 PM before she went for CT angios.  Patient received 2 units of PRBCs.  She has been hemodynamically stable   Objective: Vital signs in last 24 hours: Vitals:   08/29/18 1158 08/29/18 1226 08/29/18 1243 08/29/18 1514  BP: (!) 131/58 124/74 (!) 148/57 134/74  Pulse: 70 69 72 64  Resp: 18 20 20 20   Temp: 98.8 F (37.1 C) 98.7 F (37.1 C) (!) 97.4 F (36.3 C) 98.1 F (36.7 C)  TempSrc: Oral Oral Oral Oral  SpO2: 94% 94% 97% 96%  Weight:      Height:       Weight change:   Intake/Output Summary (Last 24 hours) at 08/29/2018 1548 Last data filed at 08/29/2018 1514 Gross per 24 hour  Intake 4979.65 ml  Output 2 ml  Net 4977.65 ml     Exam: Heart:: Regular rate and rhythm or S1S2 present Lungs: normal and clear to auscultation Abdomen: soft, moderate tenderness in suprapubic and left lower quadrant, normal bowel sounds   Lab Results: CBC Latest Ref Rng & Units 08/29/2018 08/28/2018 08/28/2018  WBC 4.0 - 10.5 K/uL - - -  Hemoglobin 12.0 - 15.0 g/dL 6.4(L) 7.5(L) 7.6(L)  Hematocrit 36.0 - 46.0 % 20.6(L) 24.5(L) 24.8(L)  Platelets 150 - 400 K/uL - - -   CMP Latest Ref Rng & Units 08/29/2018 08/27/2018 08/23/2018  Glucose 70 - 99 mg/dL 98 103(H) 183(H)  BUN 8 - 23 mg/dL 39(H) 18 19  Creatinine 0.44 - 1.00 mg/dL 0.94 0.84 1.11(H)  Sodium 135 - 145 mmol/L 141 142 141  Potassium 3.5 - 5.1 mmol/L 3.6 3.7 3.8  Chloride 98 - 111 mmol/L 115(H) 109 107  CO2 22 - 32 mmol/L 21(L) 24 22  Calcium 8.9 - 10.3 mg/dL 7.8(L) 8.9 9.3  Total Protein 6.5 - 8.1 g/dL - - 6.9  Total Bilirubin 0.3 - 1.2 mg/dL - - 0.6  Alkaline Phos 38 - 126 U/L - - 83  AST 15 - 41 U/L - - 21    ALT 0 - 44 U/L - - 11    Micro Results: Recent Results (from the past 240 hour(s))  SARS Coronavirus 2 (CEPHEID - Performed in [facility] hospital lab), Hosp Order     Status: None   Collection Time: 08/24/18  8:35 AM  Result Value Ref Range Status   SARS Coronavirus 2 NEGATIVE NEGATIVE Final    Comment: (NOTE) If result is NEGATIVE SARS-CoV-2 target nucleic acids are NOT DETECTED. The SARS-CoV-2 RNA is generally detectable in upper and lower  respiratory specimens during the acute phase of infection. The lowest  concentration of SARS-CoV-2 viral copies this assay can detect is 250  copies / mL. A negative result does not preclude SARS-CoV-2 infection  and should not be used as the sole basis for treatment or other  patient management decisions.  A negative result may occur with  improper specimen collection / handling, submission of specimen other  than nasopharyngeal swab, presence of viral mutation(s) within the  areas targeted by this assay, and inadequate number of viral copies  (<250 copies / mL). A negative result must be combined with clinical  observations, patient history, and  epidemiological information. If result is POSITIVE SARS-CoV-2 target nucleic acids are DETECTED. The SARS-CoV-2 RNA is generally detectable in upper and lower  respiratory specimens dur ing the acute phase of infection.  Positive  results are indicative of active infection with SARS-CoV-2.  Clinical  correlation with patient history and other diagnostic information is  necessary to determine patient infection status.  Positive results do  not rule out bacterial infection or co-infection with other viruses. If result is PRESUMPTIVE POSTIVE SARS-CoV-2 nucleic acids MAY BE PRESENT.   A presumptive positive result was obtained on the submitted specimen  and confirmed on repeat testing.  While 2019 novel coronavirus  (SARS-CoV-2) nucleic acids may be present in the submitted sample  additional  confirmatory testing may be necessary for epidemiological  and / or clinical management purposes  to differentiate between  SARS-CoV-2 and other Sarbecovirus currently known to infect humans.  If clinically indicated additional testing with an alternate test  methodology [id number]) is advised. The SARS-CoV-2 RNA is generally  detectable in upper and lower respiratory sp ecimens during the acute  phase of infection. The expected result is Negative. Fact Sheet for Patients:  StrictlyIdeas.no Fact Sheet for Healthcare Providers: BankingDealers.co.za This test is not yet approved or cleared by the [place] FDA and has been authorized for detection and/or diagnosis of SARS-CoV-2 by FDA under an Emergency Use Authorization (EUA).  This EUA will remain in effect (meaning this test can be used) for the duration of the COVID-19 declaration under Section 564(b)(1) of the Act, 21 U.S.C. section 360bbb-3(b)(1), unless the authorization is terminated or revoked sooner. Performed at [facility], [address] [place], [place] [address]    Studies/Results: Ct Abdomen Pelvis Wo Contrast  Result Date: 08/29/2018 CLINICAL DATA:  Rectal bleeding, falling hemoglobin, evaluate for retroperitoneal hemorrhage EXAM: CT ABDOMEN AND PELVIS WITHOUT CONTRAST TECHNIQUE: Multidetector CT imaging of the abdomen and pelvis was performed following the standard protocol without IV contrast. COMPARISON:  06/15/2018 FINDINGS: Lower chest: No acute abnormality.  Coronary artery calcifications. Hepatobiliary: No focal liver abnormality is seen. Status post cholecystectomy. No biliary dilatation. Pancreas: Unremarkable. No pancreatic ductal dilatation or surrounding inflammatory changes. Spleen: Normal in size without focal abnormality. Adrenals/Urinary Tract: Adrenal glands are unremarkable. Kidneys are normal, without renal calculi, focal lesion, or hydronephrosis.  Bladder is unremarkable. Stomach/Bowel: Stomach is within normal limits. No evidence of bowel wall thickening, distention, or inflammatory changes. There is fluid in the colon to the rectum. Vascular/Lymphatic: Calcific atherosclerosis. No enlarged abdominal or pelvic lymph nodes. Reproductive: Status post hysterectomy. Other: No abdominal wall hernia or abnormality. No abdominopelvic ascites. Musculoskeletal: No acute or significant osseous findings. IMPRESSION: 1. There is fluid in the colon to the rectum. No specific findings to localize GI bleeding on noncontrast CT. There is no significant diverticular disease. 2.  No evidence of retroperitoneal hemorrhage. 3. Chronic, incidental, and postoperative findings as detailed above. Electronically Signed   By: Eddie Candle M.D.   On: 08/29/2018 10:52   Ct Angio Abd/pel W/ And/or W/o  Result Date: 08/29/2018 CLINICAL DATA:  GI bleed. EXAM: CTA ABDOMEN AND PELVIS WITH CONTRAST TECHNIQUE: Multidetector CT imaging of the abdomen and pelvis was performed using the standard protocol during bolus administration of intravenous contrast. Multiplanar reconstructed images and MIPs were obtained and reviewed to evaluate the vascular anatomy. CONTRAST:  159mL ISOVUE-370 IOPAMIDOL (ISOVUE-370) INJECTION 76% COMPARISON:  Noncontrast study from earlier the same day FINDINGS: VASCULAR Coronary calcifications. Aorta: Moderate scattered calcified atheromatous plaque particularly in  the infrarenal segment. No aneurysm, dissection, or stenosis. Celiac: Short-segment origin stenosis of indeterminate hemodynamic significance, patent distally. SMA: Patent without evidence of aneurysm, dissection, vasculitis or significant stenosis. Renals: Single bilaterally, both with calcified ostial plaque but no definite high-grade stenosis. IMA: Patent without evidence of aneurysm, dissection, vasculitis or significant stenosis. Inflow: Mild scattered calcified plaque. No aneurysm, dissection, or  stenosis. Proximal Outflow: Bilateral common femoral and visualized portions of the superficial and profunda femoral arteries are patent without evidence of aneurysm, dissection, vasculitis or significant stenosis. Veins: Patent hepatic veins, portal vein, SMV, splenic vein, bilateral renal veins. No venous pathology evident. Review of the MIP images confirms the above findings. NON-VASCULAR Lower chest: No acute abnormality. Hepatobiliary: Cholecystectomy clips. Prominence of intrahepatic biliary tree stable since 06/15/2018. No new liver lesion. Pancreas: Unremarkable. No pancreatic ductal dilatation or surrounding inflammatory changes. Spleen: Normal in size without focal abnormality. Adrenals/Urinary Tract: Adrenal glands are unremarkable. Kidneys are normal, without renal calculi, focal lesion, or hydronephrosis. Normal adrenals. No hydronephrosis. No focal renal lesion. Urinary bladder incompletely distended. Stomach/Bowel: Stomach is nondistended. Small bowel decompressed. Lipomatous ileocecal valve. The colon is nondilated. No significant diverticular disease. No evidence of active extravasation on arterial or venous phase imaging. Lymphatic: No abdominal or pelvic adenopathy. Reproductive: Status post hysterectomy. No adnexal masses. Other: No ascites. No free air. Musculoskeletal: Lumbar fusion hardware L3-S1. Spondylitic changes throughout the lumbar spine. Bilateral hip DJD. No fracture or worrisome bone lesion. IMPRESSION: VASCULAR 1. No evidence of active extravasation or other acute finding. 2. Atherosclerosis, including aortoiliac and coronary artery disease. Please note that although the presence of coronary artery calcium documents the presence of coronary artery disease, the severity of this disease and any potential stenosis cannot be assessed on this non-gated CT examination. Assessment for potential risk factor modification, dietary therapy or pharmacologic therapy may be warranted, if  clinically indicated. NON-VASCULAR:. 1. No acute findings. Electronically Signed   By: Lucrezia Europe M.D.   On: 08/29/2018 15:03   Medications:  I have reviewed the patient's current medications. Prior to Admission:  Medications Prior to Admission  Medication Sig Dispense Refill Last Dose   acetaminophen (TYLENOL) 500 MG tablet Take 1,000 mg by mouth every 8 (eight) hours as needed for mild pain or moderate pain.   unknown at unknown   albuterol (PROVENTIL HFA;VENTOLIN HFA) 108 (90 Base) MCG/ACT inhaler Inhale 2 puffs into the lungs every 4 (four) hours as needed for wheezing or shortness of breath.   unknown at unknown   aspirin EC 325 MG tablet Take 325 mg by mouth daily.   unknown at unknown   atorvastatin (LIPITOR) 40 MG tablet Take 40 mg by mouth at bedtime.    unknown at unknown   celecoxib (CELEBREX) 200 MG capsule Take 200 mg by mouth 2 (two) times daily.    unknown at unknown   citalopram (CELEXA) 20 MG tablet Take 30 mg by mouth daily.    unknown at unknown   fluconazole (DIFLUCAN) 150 MG tablet Take 150 mg by mouth once. (may repeat in 7 days if needed)   unknown at unknown   gabapentin (NEURONTIN) 100 MG capsule Take 100 mg by mouth at bedtime as needed for tremors.   unknown at unknown   gabapentin (NEURONTIN) 300 MG capsule Take 300 mg by mouth 3 (three) times daily.    unknown at unknown   Melatonin 5 MG TABS Take 10 mg by mouth at bedtime.   unknown at unknown   metFORMIN (GLUCOPHAGE)  500 MG tablet Take 500 mg by mouth 2 (two) times daily.    unknown at unknown   nystatin ointment (MYCOSTATIN) Apply 1 application topically 2 (two) times a day.   unknown at unknown   pantoprazole (PROTONIX) 40 MG tablet Take 1 tablet (40 mg total) by mouth 2 (two) times daily. 30 tablet 1 unknown at unknown   sucralfate (CARAFATE) 1 g tablet Take 1 g by mouth 4 (four) times daily.   unknown at unknown   ondansetron (ZOFRAN) 4 MG tablet Take 1 tablet (4 mg total) by mouth every 8  (eight) hours as needed for nausea or vomiting. (Patient not taking: Reported on 08/24/2018) 8 tablet 0 Not Taking at Unknown time   sucralfate (CARAFATE) 1 GM/10ML suspension Take 10 mLs (1 g total) by mouth 4 (four) times daily -  with meals and at bedtime for 30 days. (Patient not taking: Reported on 08/24/2018) 1200 mL 0 Not Taking at Unknown time   Scheduled:  atorvastatin  40 mg Oral QHS   citalopram  30 mg Oral Daily   gabapentin  100 mg Oral TID   mouth rinse  15 mL Mouth Rinse BID   Melatonin  10 mg Oral QHS   nystatin ointment  1 application Topical BID   QUEtiapine  25 mg Oral QHS   sodium chloride flush  3 mL Intravenous Q12H   sucralfate  1 g Oral QID   Continuous:  sodium chloride 75 mL/hr at 08/29/18 1519   ferumoxytol     [START ON 08/30/2018] iron sucrose     pantoprozole (PROTONIX) infusion 8 mg/hr (08/29/18 1302)   [id number] **OR** acetaminophen (TYLENOL) oral liquid 160 mg/5 mL **OR** acetaminophen, albuterol, hydrALAZINE, ondansetron (ZOFRAN) IV, sodium chloride flush Anti-infectives (From admission, onward)   None     Scheduled Meds:  atorvastatin  40 mg Oral QHS   citalopram  30 mg Oral Daily   gabapentin  100 mg Oral TID   mouth rinse  15 mL Mouth Rinse BID   Melatonin  10 mg Oral QHS   nystatin ointment  1 application Topical BID   QUEtiapine  25 mg Oral QHS   sodium chloride flush  3 mL Intravenous Q12H   sucralfate  1 g Oral QID   Continuous Infusions:  sodium chloride 75 mL/hr at 08/29/18 1519   ferumoxytol     [START ON 08/30/2018] iron sucrose     pantoprozole (PROTONIX) infusion 8 mg/hr (08/29/18 1302)   PRN Meds:.acetaminophen **OR** acetaminophen (TYLENOL) oral liquid 160 mg/5 mL **OR** acetaminophen, albuterol, hydrALAZINE, ondansetron (ZOFRAN) IV, sodium chloride flush  Current Facility-Administered Medications:    0.9 %  sodium chloride infusion, , Intravenous, Continuous, Pyreddy, Pavan, MD, Last Rate:  75 mL/hr at 08/29/18 1519   acetaminophen (TYLENOL) tablet 650 mg, 650 mg, Oral, Q4H PRN, 650 mg at 08/29/18 1053 **OR** acetaminophen (TYLENOL) solution 650 mg, 650 mg, Per Tube, Q4H PRN **OR** acetaminophen (TYLENOL) suppository 650 mg, 650 mg, Rectal, Q4H PRN, Pyreddy, Pavan, MD   albuterol (PROVENTIL) (2.5 MG/3ML) 0.083% nebulizer solution 2.5 mg, 2.5 mg, Nebulization, Q4H PRN, Pyreddy, Pavan, MD   atorvastatin (LIPITOR) tablet 40 mg, 40 mg, Oral, QHS, Pyreddy, Pavan, MD, 40 mg at 08/28/18 2040   citalopram (CELEXA) tablet 30 mg, 30 mg, Oral, Daily, Pyreddy, Pavan, MD, 30 mg at 08/29/18 0846   ferumoxytol (FERAHEME) 510 mg in sodium chloride 0.9 % 100 mL IVPB, 510 mg, Intravenous, Once, Mitsuko Luera, Tally Due, MD   gabapentin (NEURONTIN) capsule  100 mg, 100 mg, Oral, TID, Pyreddy, Pavan, MD, 100 mg at 08/29/18 1546   hydrALAZINE (APRESOLINE) injection 10 mg, 10 mg, Intravenous, Q6H PRN, Saundra Shelling, MD   [START ON 08/30/2018] iron sucrose (VENOFER) 300 mg in sodium chloride 0.9 % 250 mL IVPB, 300 mg, Intravenous, Once, Sheana Bir, Tally Due, MD   MEDLINE mouth rinse, 15 mL, Mouth Rinse, BID, Harrie Foreman, MD, 15 mL at 08/29/18 0847   Melatonin TABS 10 mg, 10 mg, Oral, QHS, Pyreddy, Pavan, MD, 10 mg at 08/28/18 2040   nystatin ointment (MYCOSTATIN) 1 application, 1 application, Topical, BID, Pyreddy, Pavan, MD, 1 application at 93/71/69 0900   ondansetron (ZOFRAN) injection 4 mg, 4 mg, Intravenous, Q6H PRN, Pyreddy, Pavan, MD, 4 mg at 08/25/18 1050   pantoprazole (PROTONIX) 80 mg in sodium chloride 0.9 % 250 mL (0.32 mg/mL) infusion, 8 mg/hr, Intravenous, Continuous, Vaughan Basta, MD, Last Rate: 25 mL/hr at 08/29/18 1302, 8 mg/hr at 08/29/18 1302   QUEtiapine (SEROQUEL) tablet 25 mg, 25 mg, Oral, QHS, Pyreddy, Pavan, MD, 25 mg at 08/28/18 2040   sodium chloride flush (NS) 0.9 % injection 3 mL, 3 mL, Intravenous, PRN, Vaughan Basta, MD   sodium chloride flush  (NS) 0.9 % injection 3 mL, 3 mL, Intravenous, Q12H, Vaughan Basta, MD, 3 mL at 08/29/18 1522   sucralfate (CARAFATE) tablet 1 g, 1 g, Oral, QID, Pyreddy, Pavan, MD, 1 g at 08/29/18 1412   Assessment: Active Problems:   TIA (transient ischemic attack)   Encephalopathy  Acute blood loss anemia, hematochezia CT angiogram is unremarkable Known history of peptic ulcer disease History of severe iron deficiency, ferritin 6 in 05/2018  Plan: Continue to hold anticoagulation, antiplatelet therapy, day 2 Continue pantoprazole infusion Clear liquid diet only Closely monitor hemoglobin and transfuse to maintain hemoglobin > 8 Recommend aggressive parenteral iron therapy Recheck iron studies, B12 and folate levels If she continues to bleed with drop in hemoglobin, recommend endoscopic evaluation to locate and possibly treat the source of bleeding.  However, with acute stroke, she is a high risk candidate.  Would like to have neurology clearance prior to proceeding with endoscopy  We will follow along with you   LOS: 4 days   Alexa Lowe 08/29/2018, 3:48 PM

## 2018-08-30 LAB — BASIC METABOLIC PANEL
Anion gap: 5 (ref 5–15)
BUN: 22 mg/dL (ref 8–23)
CO2: 22 mmol/L (ref 22–32)
Calcium: 8.3 mg/dL — ABNORMAL LOW (ref 8.9–10.3)
Chloride: 116 mmol/L — ABNORMAL HIGH (ref 98–111)
Creatinine, Ser: 0.82 mg/dL (ref 0.44–1.00)
GFR calc Af Amer: >60 mL/min (ref >60)
GFR calc non Af Amer: >60 mL/min (ref >60)
Glucose, Bld: 101 mg/dL — ABNORMAL HIGH (ref 70–99)
Potassium: 3.4 mmol/L — ABNORMAL LOW (ref 3.5–5.1)
Sodium: 143 mmol/L (ref 135–145)

## 2018-08-30 LAB — TYPE AND SCREEN
ABO/RH(D): O POS
Antibody Screen: NEGATIVE
Unit division: 0
Unit division: 0

## 2018-08-30 LAB — CBC WITH DIFFERENTIAL/PLATELET
Abs Immature Granulocytes: 0.05 10*3/uL (ref 0.00–0.07)
Basophils Absolute: 0 10*3/uL (ref 0.0–0.1)
Basophils Relative: 1 %
Eosinophils Absolute: 0.6 10*3/uL — ABNORMAL HIGH (ref 0.0–0.5)
Eosinophils Relative: 7 %
HCT: 25.7 % — ABNORMAL LOW (ref 36.0–46.0)
Hemoglobin: 8.3 g/dL — ABNORMAL LOW (ref 12.0–15.0)
Immature Granulocytes: 1 %
Lymphocytes Relative: 27 %
Lymphs Abs: 2.4 10*3/uL (ref 0.7–4.0)
MCH: 28.5 pg (ref 26.0–34.0)
MCHC: 32.3 g/dL (ref 30.0–36.0)
MCV: 88.3 fL (ref 80.0–100.0)
Monocytes Absolute: 0.8 10*3/uL (ref 0.1–1.0)
Monocytes Relative: 10 %
Neutro Abs: 4.8 10*3/uL (ref 1.7–7.7)
Neutrophils Relative %: 54 %
Platelets: 148 10*3/uL — ABNORMAL LOW (ref 150–400)
RBC: 2.91 MIL/uL — ABNORMAL LOW (ref 3.87–5.11)
RDW: 16.1 % — ABNORMAL HIGH (ref 11.5–15.5)
WBC: 8.8 10*3/uL (ref 4.0–10.5)
nRBC: 0 % (ref 0.0–0.2)

## 2018-08-30 LAB — VITAMIN B12: Vitamin B-12: 1153 pg/mL — ABNORMAL HIGH (ref 180–914)

## 2018-08-30 LAB — BPAM RBC
Blood Product Expiration Date: 202005262359
Blood Product Expiration Date: 202006192359
ISSUE DATE / TIME: 202005230834
ISSUE DATE / TIME: 202005231220
Unit Type and Rh: 5100
Unit Type and Rh: 9500

## 2018-08-30 LAB — GLUCOSE, POCT (MANUAL RESULT ENTRY): POC Glucose: 111 mg/dl — AB (ref 70–99)

## 2018-08-30 LAB — GLUCOSE, CAPILLARY
Glucose-Capillary: 111 mg/dL — ABNORMAL HIGH (ref 70–99)
Glucose-Capillary: 151 mg/dL — ABNORMAL HIGH (ref 70–99)
Glucose-Capillary: 114 mg/dL — ABNORMAL HIGH (ref 70–99)
Glucose-Capillary: 120 mg/dL — ABNORMAL HIGH (ref 70–99)

## 2018-08-30 MED ORDER — PANTOPRAZOLE SODIUM 40 MG IV SOLR: 40.0000 mg | Freq: Two times a day (BID) | INTRAVENOUS | Status: DC

## 2018-08-30 MED ORDER — POTASSIUM CHLORIDE 10 MEQ/100ML IV SOLN
10.0000 meq | INTRAVENOUS | Status: DC
  Administered 2018-08-30: 10 meq via INTRAVENOUS
  Filled 2018-08-30 (×2): qty 100

## 2018-08-30 NOTE — Progress Notes (Signed)
Pt's HGB trended down to 8.3. Had large pasty black stool this a.m. Protonix drip continued. IVF"s increased to 178m/hr with discuss with Dr. Stark Klein regarding  fluid overload with no new orders.  Iron IV given x1. K+ 10 meq IV given for K+ 3.4. Tylenol prn effective for chronic back pain with some anxiety identified. Eating small amounts and tolerating. No n/v. Color more pale this p.m  VSS. Will continue to monitor for sign/symptoms rectal bleeding. TC's with family updated by RN/pt.

## 2018-08-30 NOTE — Progress Notes (Addendum)
Subjective: Significant improvement. Able to state name, location.    Objective: Current vital signs: BP (!) 139/50 (BP Location: Right Arm)   Pulse (!) 55   Temp 98.2 F (36.8 C) (Oral)   Resp 20   Ht 5\' 6"  (1.676 m)   Wt 91 kg   SpO2 95%   BMI 32.38 kg/m  Vital signs in last 24 hours: Temp:  [97.4 F (36.3 C)-98.8 F (37.1 C)] 98.2 F (36.8 C) (05/24 0754) Pulse Rate:  [52-72] 55 (05/24 0754) Resp:  [18-20] 20 (05/24 0754) BP: (124-148)/(49-82) 139/50 (05/24 0754) SpO2:  [94 %-98 %] 95 % (05/24 0754)  Intake/Output from previous day: 05/23 0701 - 05/24 0700 In: 2678.4 [P.O.:600; I.V.:878.6; Blood:1085; IV Piggyback:114.9] Out: -  Intake/Output this shift: Total I/O In: 431.8 [I.V.:204.8; IV Piggyback:226.9] Out: -  Nutritional status:  Diet Order            DIET DYS 3 Room service appropriate? Yes; Fluid consistency: Thin  Diet effective now              Neurologic Exam: Mental Status: Alert, oriented ot person, place, month and year.  Thought content appropriate.  Speech fluent without evidence of aphasia.  Able to follow commands without difficulty. Cranial Nerves: II: Discs flat bilaterally; Visual fields grossly normal, pupils equal, round, reactive to light and accommodation III,IV, VI: ptosis not present, extra-ocular motions intact bilaterally V,VII: smile symmetric, facial light touch sensation normal bilaterally VIII: hearing normal bilaterally IX,X: gag reflex present XI: bilateral shoulder shrug XII: midline tongue extension Motor: Patient moves all extremities against gravity with no focal weakness noted   Lab Results: Basic Metabolic Panel: Recent Labs  Lab 08/23/18 2208 08/27/18 0358 08/29/18 0139 08/30/18 0441  NA 141 142 141 143  K 3.8 3.7 3.6 3.4*  CL 107 109 115* 116*  CO2 22 24 21* 22  GLUCOSE 183* 103* 98 101*  BUN 19 18 39* 22  CREATININE 1.11* 0.84 0.94 0.82  CALCIUM 9.3 8.9 7.8* 8.3*    Liver Function Tests: Recent  Labs  Lab 08/23/18 2208  AST 21  ALT 11  ALKPHOS 83  BILITOT 0.6  PROT 6.9  ALBUMIN 3.8   No results for input(s): LIPASE, AMYLASE in the last 168 hours. No results for input(s): AMMONIA in the last 168 hours.  CBC: Recent Labs  Lab 08/23/18 2208 08/27/18 0358 08/28/18 1418 08/28/18 1922 08/29/18 0139 08/29/18 1602 08/30/18 0441  WBC 11.2* 10.1  --   --   --   --  8.8  NEUTROABS  --   --   --   --   --   --  4.8  HGB 11.7* 10.6* 7.6* 7.5* 6.4* 9.3* 8.3*  HCT 39.4 34.4* 24.8* 24.5* 20.6* 28.5* 25.7*  MCV 91.2 87.8  --   --   --   --  88.3  PLT 237 218  --   --   --   --  148*    Cardiac Enzymes: No results for input(s): CKTOTAL, CKMB, CKMBINDEX, TROPONINI in the last 168 hours.  Lipid Panel: Recent Labs  Lab 08/25/18 0341 08/27/18 0358  CHOL 112 117  TRIG 52 75  HDL 42 33*  CHOLHDL 2.7 3.5  VLDL 10 15  LDLCALC 60 69    CBG: Recent Labs  Lab 08/28/18 2103 08/29/18 0741 08/29/18 1201 08/29/18 1744 08/29/18 2144  GLUCAP 111* 127* 171* 119* 135*    Microbiology: Results for orders placed or performed during the  hospital encounter of 08/23/18  SARS Coronavirus 2 (CEPHEID - Performed in [facility] hospital lab), Hosp Order     Status: None   Collection Time: 08/24/18  8:35 AM  Result Value Ref Range Status   SARS Coronavirus 2 NEGATIVE NEGATIVE Final    Comment: (NOTE) If result is NEGATIVE SARS-CoV-2 target nucleic acids are NOT DETECTED. The SARS-CoV-2 RNA is generally detectable in upper and lower  respiratory specimens during the acute phase of infection. The lowest  concentration of SARS-CoV-2 viral copies this assay can detect is 250  copies / mL. A negative result does not preclude SARS-CoV-2 infection  and should not be used as the sole basis for treatment or other  patient management decisions.  A negative result may occur with  improper specimen collection / handling, submission of specimen other  than nasopharyngeal swab, presence of  viral mutation(s) within the  areas targeted by this assay, and inadequate number of viral copies  (<250 copies / mL). A negative result must be combined with clinical  observations, patient history, and epidemiological information. If result is POSITIVE SARS-CoV-2 target nucleic acids are DETECTED. The SARS-CoV-2 RNA is generally detectable in upper and lower  respiratory specimens dur ing the acute phase of infection.  Positive  results are indicative of active infection with SARS-CoV-2.  Clinical  correlation with patient history and other diagnostic information is  necessary to determine patient infection status.  Positive results do  not rule out bacterial infection or co-infection with other viruses. If result is PRESUMPTIVE POSTIVE SARS-CoV-2 nucleic acids MAY BE PRESENT.   A presumptive positive result was obtained on the submitted specimen  and confirmed on repeat testing.  While 2019 novel coronavirus  (SARS-CoV-2) nucleic acids may be present in the submitted sample  additional confirmatory testing may be necessary for epidemiological  and / or clinical management purposes  to differentiate between  SARS-CoV-2 and other Sarbecovirus currently known to infect humans.  If clinically indicated additional testing with an alternate test  methodology [phone]) is advised. The SARS-CoV-2 RNA is generally  detectable in upper and lower respiratory sp ecimens during the acute  phase of infection. The expected result is Negative. Fact Sheet for Patients:  StrictlyIdeas.no Fact Sheet for Healthcare Providers: BankingDealers.co.za This test is not yet approved or cleared by the [place] FDA and has been authorized for detection and/or diagnosis of SARS-CoV-2 by FDA under an Emergency Use Authorization (EUA).  This EUA will remain in effect (meaning this test can be used) for the duration of the COVID-19 declaration under Section  564(b)(1) of the Act, 21 U.S.C. section 360bbb-3(b)(1), unless the authorization is terminated or revoked sooner. Performed at [facility], [address] [place], [place] [address]     Coagulation Studies: No results for input(s): LABPROT, INR in the last 72 hours.  Imaging: Ct Abdomen Pelvis Wo Contrast  Result Date: 08/29/2018 CLINICAL DATA:  Rectal bleeding, falling hemoglobin, evaluate for retroperitoneal hemorrhage EXAM: CT ABDOMEN AND PELVIS WITHOUT CONTRAST TECHNIQUE: Multidetector CT imaging of the abdomen and pelvis was performed following the standard protocol without IV contrast. COMPARISON:  06/15/2018 FINDINGS: Lower chest: No acute abnormality.  Coronary artery calcifications. Hepatobiliary: No focal liver abnormality is seen. Status post cholecystectomy. No biliary dilatation. Pancreas: Unremarkable. No pancreatic ductal dilatation or surrounding inflammatory changes. Spleen: Normal in size without focal abnormality. Adrenals/Urinary Tract: Adrenal glands are unremarkable. Kidneys are normal, without renal calculi, focal lesion, or hydronephrosis. Bladder is unremarkable. Stomach/Bowel: Stomach is within  normal limits. No evidence of bowel wall thickening, distention, or inflammatory changes. There is fluid in the colon to the rectum. Vascular/Lymphatic: Calcific atherosclerosis. No enlarged abdominal or pelvic lymph nodes. Reproductive: Status post hysterectomy. Other: No abdominal wall hernia or abnormality. No abdominopelvic ascites. Musculoskeletal: No acute or significant osseous findings. IMPRESSION: 1. There is fluid in the colon to the rectum. No specific findings to localize GI bleeding on noncontrast CT. There is no significant diverticular disease. 2.  No evidence of retroperitoneal hemorrhage. 3. Chronic, incidental, and postoperative findings as detailed above. Electronically Signed   By: Eddie Candle M.D.   On: 08/29/2018 10:52   Ct Angio Abd/pel W/ And/or  W/o  Result Date: 08/29/2018 CLINICAL DATA:  GI bleed. EXAM: CTA ABDOMEN AND PELVIS WITH CONTRAST TECHNIQUE: Multidetector CT imaging of the abdomen and pelvis was performed using the standard protocol during bolus administration of intravenous contrast. Multiplanar reconstructed images and MIPs were obtained and reviewed to evaluate the vascular anatomy. CONTRAST:  131mL ISOVUE-370 IOPAMIDOL (ISOVUE-370) INJECTION 76% COMPARISON:  Noncontrast study from earlier the same day FINDINGS: VASCULAR Coronary calcifications. Aorta: Moderate scattered calcified atheromatous plaque particularly in the infrarenal segment. No aneurysm, dissection, or stenosis. Celiac: Short-segment origin stenosis of indeterminate hemodynamic significance, patent distally. SMA: Patent without evidence of aneurysm, dissection, vasculitis or significant stenosis. Renals: Single bilaterally, both with calcified ostial plaque but no definite high-grade stenosis. IMA: Patent without evidence of aneurysm, dissection, vasculitis or significant stenosis. Inflow: Mild scattered calcified plaque. No aneurysm, dissection, or stenosis. Proximal Outflow: Bilateral common femoral and visualized portions of the superficial and profunda femoral arteries are patent without evidence of aneurysm, dissection, vasculitis or significant stenosis. Veins: Patent hepatic veins, portal vein, SMV, splenic vein, bilateral renal veins. No venous pathology evident. Review of the MIP images confirms the above findings. NON-VASCULAR Lower chest: No acute abnormality. Hepatobiliary: Cholecystectomy clips. Prominence of intrahepatic biliary tree stable since 06/15/2018. No new liver lesion. Pancreas: Unremarkable. No pancreatic ductal dilatation or surrounding inflammatory changes. Spleen: Normal in size without focal abnormality. Adrenals/Urinary Tract: Adrenal glands are unremarkable. Kidneys are normal, without renal calculi, focal lesion, or hydronephrosis. Normal  adrenals. No hydronephrosis. No focal renal lesion. Urinary bladder incompletely distended. Stomach/Bowel: Stomach is nondistended. Small bowel decompressed. Lipomatous ileocecal valve. The colon is nondilated. No significant diverticular disease. No evidence of active extravasation on arterial or venous phase imaging. Lymphatic: No abdominal or pelvic adenopathy. Reproductive: Status post hysterectomy. No adnexal masses. Other: No ascites. No free air. Musculoskeletal: Lumbar fusion hardware L3-S1. Spondylitic changes throughout the lumbar spine. Bilateral hip DJD. No fracture or worrisome bone lesion. IMPRESSION: VASCULAR 1. No evidence of active extravasation or other acute finding. 2. Atherosclerosis, including aortoiliac and coronary artery disease. Please note that although the presence of coronary artery calcium documents the presence of coronary artery disease, the severity of this disease and any potential stenosis cannot be assessed on this non-gated CT examination. Assessment for potential risk factor modification, dietary therapy or pharmacologic therapy may be warranted, if clinically indicated. NON-VASCULAR:. 1. No acute findings. Electronically Signed   By: Lucrezia Europe M.D.   On: 08/29/2018 15:03    Medications:  I have reviewed the patient's current medications. Scheduled: . atorvastatin  40 mg Oral QHS  . citalopram  30 mg Oral Daily  . gabapentin  100 mg Oral TID  . Melatonin  10 mg Oral QHS  . nystatin ointment  1 application Topical BID  . QUEtiapine  25 mg Oral QHS  .  sodium chloride flush  3 mL Intravenous Q12H  . sucralfate  1 g Oral QID    Assessment/Plan: Patient continues to improve cognitively.  A1c 5.7.  LDL 69.     Pt has a very small parietal 57mm stroke.  Mentation significantly improved.  Pt with hematochezia and negative CTA Agree with holding anti platelet therapy Blood transfusion  Given the very small stroke and improvement in currently baseline I do feel  that benefits of endoscopic procedure to look for source of bleeding outweigh the negatives Agree with endoscopic procedure after which can discuss anti platelet therapy.    08/30/2018  10:16 AM     LOS: 5 days   Leotis Pain  08/30/2018  10:16 AM

## 2018-08-30 NOTE — Progress Notes (Signed)
[facility] NAME: Quinisha Mould    MR#:  [id number]  DATE OF BIRTH:  January 15, 1931  SUBJECTIVE:   Chief Complaint  Patient presents with   Altered Mental Status  Patient is more awake and alert today. She is able to hold a conversation without getting easily fatigued. Her only complaint today is some discomfort with IV iron infusion which she thinks is slightly better after Tylenol.  No new episodes of loose tarry stools per nursing staff. She received 2 units of PRBCs yesterday with improvement in her hgb levels today.  REVIEW OF SYSTEMS:  Review of Systems  Constitutional: Negative for chills, fever, malaise/fatigue and weight loss.  HENT: Negative for congestion, hearing loss and sore throat.   Eyes: Negative for blurred vision and double vision.  Respiratory: Negative for cough, shortness of breath and wheezing.   Cardiovascular: Negative for chest pain, palpitations, orthopnea and leg swelling.  Gastrointestinal: Negative for abdominal pain, diarrhea, nausea and vomiting.  Genitourinary: Negative for dysuria and urgency.  Musculoskeletal: Negative for myalgias.  Skin: Negative for rash.  Neurological: Negative for dizziness, sensory change, speech change, focal weakness and headaches.  Psychiatric/Behavioral: Negative for depression.   DRUG ALLERGIES:   Allergies  Allergen Reactions   Morphine And Related Other (See Comments)    Reaction:  Hallucinations    Codeine Nausea And Vomiting   VITALS:  Blood pressure (!) 139/50, pulse (!) 55, temperature 98.2 F (36.8 C), temperature source Oral, resp. rate 20, height 5\' 6"  (1.676 m), weight 91 kg, SpO2 95 %. PHYSICAL EXAMINATION:   GENERAL:  85 y.o.-year-old patient lying in the bed with no acute distress.  EYES: Pupils equal, round, reactive to light and accommodation. No scleral icterus. Extraocular muscles intact.  HEENT: Head atraumatic, normocephalic. Oropharynx  and nasopharynx clear.  NECK:  Supple, no jugular venous distention. No thyroid enlargement, no tenderness.  LUNGS: Normal breath sounds bilaterally, no wheezing, rales,rhonchi or crepitation. No use of accessory muscles of respiration.  CARDIOVASCULAR: S1, S2 normal. No murmurs, rubs, or gallops.  ABDOMEN: Soft, nontender, nondistended. Bowel sounds present. No organomegaly or mass.  EXTREMITIES: No pedal edema, cyanosis, or clubbing.  NEUROLOGIC: Cranial nerves II through XII are intact. Muscle strength 5/5 in all extremities. Sensation intact. Gait not checked.  PSYCHIATRIC: The patient is alert and oriented x 3.  SKIN: No obvious rash, lesion, or ulcer.   DATA REVIEWED:  LABORATORY PANEL:  Female CBC Recent Labs  Lab 08/30/18 0441  WBC 8.8  HGB 8.3*  HCT 25.7*  PLT 148*   ------------------------------------------------------------------------------------------------------------------ Chemistries  Recent Labs  Lab 08/23/18 2208  08/30/18 0441  NA 141   < > 143  K 3.8   < > 3.4*  CL 107   < > 116*  CO2 22   < > 22  GLUCOSE 183*   < > 101*  BUN 19   < > 22  CREATININE 1.11*   < > 0.82  CALCIUM 9.3   < > 8.3*  AST 21  --   --   ALT 11  --   --   ALKPHOS 83  --   --   BILITOT 0.6  --   --    < > = values in this interval not displayed.   RADIOLOGY:  Ct Abdomen Pelvis Wo Contrast  Result Date: 08/29/2018 CLINICAL DATA:  Rectal bleeding, falling hemoglobin, evaluate for retroperitoneal hemorrhage EXAM: CT ABDOMEN AND PELVIS WITHOUT CONTRAST  TECHNIQUE: Multidetector CT imaging of the abdomen and pelvis was performed following the standard protocol without IV contrast. COMPARISON:  06/15/2018 FINDINGS: Lower chest: No acute abnormality.  Coronary artery calcifications. Hepatobiliary: No focal liver abnormality is seen. Status post cholecystectomy. No biliary dilatation. Pancreas: Unremarkable. No pancreatic ductal dilatation or surrounding inflammatory changes. Spleen: Normal  in size without focal abnormality. Adrenals/Urinary Tract: Adrenal glands are unremarkable. Kidneys are normal, without renal calculi, focal lesion, or hydronephrosis. Bladder is unremarkable. Stomach/Bowel: Stomach is within normal limits. No evidence of bowel wall thickening, distention, or inflammatory changes. There is fluid in the colon to the rectum. Vascular/Lymphatic: Calcific atherosclerosis. No enlarged abdominal or pelvic lymph nodes. Reproductive: Status post hysterectomy. Other: No abdominal wall hernia or abnormality. No abdominopelvic ascites. Musculoskeletal: No acute or significant osseous findings. IMPRESSION: 1. There is fluid in the colon to the rectum. No specific findings to localize GI bleeding on noncontrast CT. There is no significant diverticular disease. 2.  No evidence of retroperitoneal hemorrhage. 3. Chronic, incidental, and postoperative findings as detailed above. Electronically Signed   By: Eddie Candle M.D.   On: 08/29/2018 10:52   Ct Angio Abd/pel W/ And/or W/o  Result Date: 08/29/2018 CLINICAL DATA:  GI bleed. EXAM: CTA ABDOMEN AND PELVIS WITH CONTRAST TECHNIQUE: Multidetector CT imaging of the abdomen and pelvis was performed using the standard protocol during bolus administration of intravenous contrast. Multiplanar reconstructed images and MIPs were obtained and reviewed to evaluate the vascular anatomy. CONTRAST:  159mL ISOVUE-370 IOPAMIDOL (ISOVUE-370) INJECTION 76% COMPARISON:  Noncontrast study from earlier the same day FINDINGS: VASCULAR Coronary calcifications. Aorta: Moderate scattered calcified atheromatous plaque particularly in the infrarenal segment. No aneurysm, dissection, or stenosis. Celiac: Short-segment origin stenosis of indeterminate hemodynamic significance, patent distally. SMA: Patent without evidence of aneurysm, dissection, vasculitis or significant stenosis. Renals: Single bilaterally, both with calcified ostial plaque but no definite high-grade  stenosis. IMA: Patent without evidence of aneurysm, dissection, vasculitis or significant stenosis. Inflow: Mild scattered calcified plaque. No aneurysm, dissection, or stenosis. Proximal Outflow: Bilateral common femoral and visualized portions of the superficial and profunda femoral arteries are patent without evidence of aneurysm, dissection, vasculitis or significant stenosis. Veins: Patent hepatic veins, portal vein, SMV, splenic vein, bilateral renal veins. No venous pathology evident. Review of the MIP images confirms the above findings. NON-VASCULAR Lower chest: No acute abnormality. Hepatobiliary: Cholecystectomy clips. Prominence of intrahepatic biliary tree stable since 06/15/2018. No new liver lesion. Pancreas: Unremarkable. No pancreatic ductal dilatation or surrounding inflammatory changes. Spleen: Normal in size without focal abnormality. Adrenals/Urinary Tract: Adrenal glands are unremarkable. Kidneys are normal, without renal calculi, focal lesion, or hydronephrosis. Normal adrenals. No hydronephrosis. No focal renal lesion. Urinary bladder incompletely distended. Stomach/Bowel: Stomach is nondistended. Small bowel decompressed. Lipomatous ileocecal valve. The colon is nondilated. No significant diverticular disease. No evidence of active extravasation on arterial or venous phase imaging. Lymphatic: No abdominal or pelvic adenopathy. Reproductive: Status post hysterectomy. No adnexal masses. Other: No ascites. No free air. Musculoskeletal: Lumbar fusion hardware L3-S1. Spondylitic changes throughout the lumbar spine. Bilateral hip DJD. No fracture or worrisome bone lesion. IMPRESSION: VASCULAR 1. No evidence of active extravasation or other acute finding. 2. Atherosclerosis, including aortoiliac and coronary artery disease. Please note that although the presence of coronary artery calcium documents the presence of coronary artery disease, the severity of this disease and any potential stenosis  cannot be assessed on this non-gated CT examination. Assessment for potential risk factor modification, dietary therapy or pharmacologic therapy may be warranted,  if clinically indicated. NON-VASCULAR:. 1. No acute findings. Electronically Signed   By: Lucrezia Europe M.D.   On: 08/29/2018 15:03   ASSESSMENT AND PLAN:   85 y.o. female  presenting with altered mental status found to have acute ischemic stroke on MRI brain. Patient started on DUAP however was noted to with an episode of hematochezia.  1. Acute GI bleed - Likely Lower GI bleed without evidence of hemodynamic instability.  - Recent EGD on 05/2018 due to anemia and melena showed a nonbleeding gastric antral ulcer. Per GI at high risk for post procedure complication from sedation given recent AMS and stroke. - CTA  Abdomen and pelvis reviewed and shows no source of bleed. - Pending Neurology clearance - S/p PRBCs on 5/23 for hgb of 6.4 with improvement in hgb to 8.3 today - Continue to monitor H&H  - Pantoprazole 80mg  IV gtt 8mg /hr - Hold NSAIDs, steroids, ASA - GI following  2. Acute CVA - brain reviewed and shows punctate 4 mm focus of diffusion abnormality involving the left parietal cortex - Patient was on DUAP however this is being held due to recent acute GI bleed - US carotids shows no hemodynamically significant stenosis - Echocardiogram shows no cardiac source of emboli - Continue statin with goal LDL < 70 mg/dl - PT consult, OT consult, Speech consult - Neuro checks per protocol - Neurology input appreciated  3. Altered mental status - Likely due to recent acute CVA. Mental status now improved - Continue to monitor - Will avoid Benzos - LP deferred due to improving mental status   4. Type 2 diabetes mellitus without complication, without long-term current use of insulin (A1c 5.7% - 08/27/18) - Well controlled. No changes to current regimen. - Low intensity sliding scale insulin coverage - ADA 2100 calorie diet  - FS BS  qac and qhs  5. Essential hypertension - controlled - Prn Hydralazine  6. COPD - No evidence of exacerbation - Continue DuoNeb PRN  7.  DVT prophylaxis -Lovenox held due to recent GI bleed  Patients' daughter updated on plan of care and current progress.  All the records are reviewed and case discussed with Care Management/Social Worker. Management plans discussed with the patient, family and they are in agreement.  CODE STATUS: DNR  TOTAL TIME TAKING CARE OF THIS PATIENT: 38 minutes.   More than 50% of the time was spent in counseling/coordination of care: YES  POSSIBLE D/C IN 3 DAYS, DEPENDING ON CLINICAL CONDITION.   on 08/30/2018 at 9:09 AM  This patient was staffed with Dr. Vaughan Basta who personally evaluated patient, reviewed documentation and agreed with assessment and plan of care as above.  Rufina Falco, DNP, FNP-BC [facility] Nurse Practitioner   Between 7am to 6pm - Pager [phone]  After 6pm go to [email or web address] - Proofreader  [facility]  Office  [phone]  CC: Primary care physician; Dion Body, MD  Note: This dictation was prepared with Dragon dictation along with smaller phrase technology. Any transcriptional errors that result from this process are unintentional.

## 2018-08-31 ENCOUNTER — Inpatient Hospital Stay: Payer: Medicare HMO | Admitting: Anesthesiology

## 2018-08-31 ENCOUNTER — Encounter: Payer: Self-pay | Admitting: *Deleted

## 2018-08-31 ENCOUNTER — Encounter
Admission: EM | Disposition: A | Discharge status: Home Health | Payer: Self-pay | Source: Home / Self Care | Attending: Internal Medicine

## 2018-08-31 HISTORY — PX: ESOPHAGOGASTRODUODENOSCOPY: SHX5428

## 2018-08-31 LAB — BASIC METABOLIC PANEL WITH GFR
Anion gap: 7 (ref 5–15)
BUN: 12 mg/dL (ref 8–23)
CO2: 21 mmol/L — ABNORMAL LOW (ref 22–32)
Calcium: 8.4 mg/dL — ABNORMAL LOW (ref 8.9–10.3)
Chloride: 114 mmol/L — ABNORMAL HIGH (ref 98–111)
Creatinine, Ser: 0.81 mg/dL (ref 0.44–1.00)
GFR calc Af Amer: >60 mL/min (ref >60)
GFR calc non Af Amer: >60 mL/min (ref >60)
Glucose, Bld: 101 mg/dL — ABNORMAL HIGH (ref 70–99)
Potassium: 3.5 mmol/L (ref 3.5–5.1)
Sodium: 142 mmol/L (ref 135–145)

## 2018-08-31 LAB — CBC WITH DIFFERENTIAL/PLATELET
Abs Immature Granulocytes: 0.06 10*3/uL (ref 0.00–0.07)
Basophils Absolute: 0 10*3/uL (ref 0.0–0.1)
Basophils Relative: 0 %
Eosinophils Absolute: 0.6 10*3/uL — ABNORMAL HIGH (ref 0.0–0.5)
Eosinophils Relative: 7 %
HCT: 26.9 % — ABNORMAL LOW (ref 36.0–46.0)
Hemoglobin: 8.6 g/dL — ABNORMAL LOW (ref 12.0–15.0)
Immature Granulocytes: 1 %
Lymphocytes Relative: 20 %
Lymphs Abs: 1.8 10*3/uL (ref 0.7–4.0)
MCH: 28.9 pg (ref 26.0–34.0)
MCHC: 32 g/dL (ref 30.0–36.0)
MCV: 90.3 fL (ref 80.0–100.0)
Monocytes Absolute: 0.8 10*3/uL (ref 0.1–1.0)
Monocytes Relative: 9 %
Neutro Abs: 5.7 10*3/uL (ref 1.7–7.7)
Neutrophils Relative %: 63 %
Platelets: 153 10*3/uL (ref 150–400)
RBC: 2.98 MIL/uL — ABNORMAL LOW (ref 3.87–5.11)
RDW: 16.4 % — ABNORMAL HIGH (ref 11.5–15.5)
WBC: 9 10*3/uL (ref 4.0–10.5)
nRBC: 0 % (ref 0.0–0.2)

## 2018-08-31 LAB — GLUCOSE, CAPILLARY
Glucose-Capillary: 104 mg/dL — ABNORMAL HIGH (ref 70–99)
Glucose-Capillary: 113 mg/dL — ABNORMAL HIGH (ref 70–99)
Glucose-Capillary: 204 mg/dL — ABNORMAL HIGH (ref 70–99)
Glucose-Capillary: 135 mg/dL — ABNORMAL HIGH (ref 70–99)

## 2018-08-31 SURGERY — EGD (ESOPHAGOGASTRODUODENOSCOPY)
Anesthesia: General

## 2018-08-31 MED ORDER — PROPOFOL 500 MG/50ML IV EMUL
INTRAVENOUS | Status: AC
  Filled 2018-08-31: qty 50

## 2018-08-31 MED ORDER — PROPOFOL 10 MG/ML IV BOLUS
INTRAVENOUS | Status: DC | PRN
  Administered 2018-08-31: 70 mg via INTRAVENOUS
  Administered 2018-08-31 (×2): 20 mg via INTRAVENOUS

## 2018-08-31 MED ORDER — PANTOPRAZOLE SODIUM 40 MG PO TBEC
40.0000 mg | DELAYED_RELEASE_TABLET | Freq: Two times a day (BID) | ORAL | Status: DC
  Administered 2018-08-31 – 2018-09-02 (×4): 40 mg via ORAL
  Filled 2018-08-31 (×5): qty 1

## 2018-08-31 MED ORDER — BOOST / RESOURCE BREEZE PO LIQD CUSTOM
1.0000 | Freq: Three times a day (TID) | ORAL | Status: DC
  Administered 2018-08-31 – 2018-09-02 (×6): 1 via ORAL

## 2018-08-31 MED ORDER — SODIUM CHLORIDE 0.9 % IV SOLN
300.0000 mg | Freq: Once | INTRAVENOUS | Status: AC
  Administered 2018-09-01: 04:00:00 300 mg via INTRAVENOUS
  Filled 2018-08-31: qty 15

## 2018-08-31 NOTE — Progress Notes (Signed)
[facility] [place] NAME: Alexa Lowe    MR#:  [id number]  DATE OF BIRTH:  25-Jul-1930  SUBJECTIVE:   Chief Complaint  Patient presents with   Altered Mental Status   Patient is seen at the bedside. Patient is found laying in bed in NAD. Overall she feels her condition is gradually improving. Voices no new complaints. No new events reported overnight.  No new episodes of loose tarry stools per nursing staff. Hemoglobin continues to improve post 2 units of PRBCs on 5/23  REVIEW OF SYSTEMS:  Review of Systems  Constitutional: Negative for chills, fever, malaise/fatigue and weight loss.  HENT: Negative for congestion, hearing loss and sore throat.   Eyes: Negative for blurred vision and double vision.  Respiratory: Negative for cough, shortness of breath and wheezing.   Cardiovascular: Negative for chest pain, palpitations, orthopnea and leg swelling.  Gastrointestinal: Negative for abdominal pain, diarrhea, nausea and vomiting.  Genitourinary: Negative for dysuria and urgency.  Musculoskeletal: Negative for myalgias.  Skin: Negative for rash.  Neurological: Negative for dizziness, sensory change, speech change, focal weakness and headaches.  Psychiatric/Behavioral: Negative for depression.   DRUG ALLERGIES:   Allergies  Allergen Reactions   Morphine And Related Other (See Comments)    Reaction:  Hallucinations    Codeine Nausea And Vomiting   VITALS:  Blood pressure (!) 146/59, pulse (!) 59, temperature 98 F (36.7 C), temperature source Oral, resp. rate 20, height 5\' 6"  (1.676 m), weight 91 kg, SpO2 95 %. PHYSICAL EXAMINATION:   GENERAL:  85 y.o.-year-old patient lying in the bed with no acute distress.  EYES: Pupils equal, round, reactive to light and accommodation. No scleral icterus. Extraocular muscles intact.  HEENT: Head atraumatic, normocephalic. Oropharynx and nasopharynx clear.  NECK:  Supple, no jugular venous  distention. No thyroid enlargement, no tenderness.  LUNGS: Normal breath sounds bilaterally, no wheezing, rales,rhonchi or crepitation. No use of accessory muscles of respiration.  CARDIOVASCULAR: S1, S2 normal. No murmurs, rubs, or gallops.  ABDOMEN: Soft, nontender, nondistended. Bowel sounds present. No organomegaly or mass.  EXTREMITIES: No pedal edema, cyanosis, or clubbing.  NEUROLOGIC: Cranial nerves II through XII are intact. Muscle strength 5/5 in all extremities. Sensation intact. Gait not checked.  PSYCHIATRIC: The patient is alert and oriented x 3.  SKIN: No obvious rash, lesion, or ulcer.   DATA REVIEWED:  LABORATORY PANEL:  Female CBC Recent Labs  Lab 08/31/18 0525  WBC 9.0  HGB 8.6*  HCT 26.9*  PLT 153   ------------------------------------------------------------------------------------------------------------------ Chemistries  Recent Labs  Lab 08/31/18 0525  NA 142  K 3.5  CL 114*  CO2 21*  GLUCOSE 101*  BUN 12  CREATININE 0.81  CALCIUM 8.4*   RADIOLOGY:  Ct Angio Abd/pel W/ And/or W/o  Result Date: 08/29/2018 CLINICAL DATA:  GI bleed. EXAM: CTA ABDOMEN AND PELVIS WITH CONTRAST TECHNIQUE: Multidetector CT imaging of the abdomen and pelvis was performed using the standard protocol during bolus administration of intravenous contrast. Multiplanar reconstructed images and MIPs were obtained and reviewed to evaluate the vascular anatomy. CONTRAST:  182mL ISOVUE-370 IOPAMIDOL (ISOVUE-370) INJECTION 76% COMPARISON:  Noncontrast study from earlier the same day FINDINGS: VASCULAR Coronary calcifications. Aorta: Moderate scattered calcified atheromatous plaque particularly in the infrarenal segment. No aneurysm, dissection, or stenosis. Celiac: Short-segment origin stenosis of indeterminate hemodynamic significance, patent distally. SMA: Patent without evidence of aneurysm, dissection, vasculitis or significant stenosis. Renals: Single bilaterally, both with calcified  ostial plaque but  no definite high-grade stenosis. IMA: Patent without evidence of aneurysm, dissection, vasculitis or significant stenosis. Inflow: Mild scattered calcified plaque. No aneurysm, dissection, or stenosis. Proximal Outflow: Bilateral common femoral and visualized portions of the superficial and profunda femoral arteries are patent without evidence of aneurysm, dissection, vasculitis or significant stenosis. Veins: Patent hepatic veins, portal vein, SMV, splenic vein, bilateral renal veins. No venous pathology evident. Review of the MIP images confirms the above findings. NON-VASCULAR Lower chest: No acute abnormality. Hepatobiliary: Cholecystectomy clips. Prominence of intrahepatic biliary tree stable since 06/15/2018. No new liver lesion. Pancreas: Unremarkable. No pancreatic ductal dilatation or surrounding inflammatory changes. Spleen: Normal in size without focal abnormality. Adrenals/Urinary Tract: Adrenal glands are unremarkable. Kidneys are normal, without renal calculi, focal lesion, or hydronephrosis. Normal adrenals. No hydronephrosis. No focal renal lesion. Urinary bladder incompletely distended. Stomach/Bowel: Stomach is nondistended. Small bowel decompressed. Lipomatous ileocecal valve. The colon is nondilated. No significant diverticular disease. No evidence of active extravasation on arterial or venous phase imaging. Lymphatic: No abdominal or pelvic adenopathy. Reproductive: Status post hysterectomy. No adnexal masses. Other: No ascites. No free air. Musculoskeletal: Lumbar fusion hardware L3-S1. Spondylitic changes throughout the lumbar spine. Bilateral hip DJD. No fracture or worrisome bone lesion. IMPRESSION: VASCULAR 1. No evidence of active extravasation or other acute finding. 2. Atherosclerosis, including aortoiliac and coronary artery disease. Please note that although the presence of coronary artery calcium documents the presence of coronary artery disease, the severity of this  disease and any potential stenosis cannot be assessed on this non-gated CT examination. Assessment for potential risk factor modification, dietary therapy or pharmacologic therapy may be warranted, if clinically indicated. NON-VASCULAR:. 1. No acute findings. Electronically Signed   By: Lucrezia Europe M.D.   On: 08/29/2018 15:03   ASSESSMENT AND PLAN:   85 y.o. female  presenting with altered mental status found to have acute ischemic stroke on MRI brain. Patient started on DUAP however was noted to with an episode of hematochezia.  1. Acute GI bleed - Likely Lower GI bleed without evidence of hemodynamic instability.  - Recent EGD on 05/2018 due to anemia and melena showed a nonbleeding gastric antral ulcer.  - CTA  Abdomen and pelvis reviewed and shows no source of bleed. - S/p PRBCs on 5/23 for hgb of 6.4 with improvement in hgb to 8.6 today - Continue to monitor H&H  - Pantoprazole 80mg  IV gtt 8mg /hr - Hold NSAIDs, steroids, ASA - Neurology clearance obtained for procedure - GI following scheduled for EGD today  2. Acute CVA - brain reviewed and shows punctate 4 mm focus of diffusion abnormality involving the left parietal cortex - Patient was on DUAP however this is being held due to recent acute GI bleed - US carotids shows no hemodynamically significant stenosis - Echocardiogram shows no cardiac source of emboli - Continue statin with goal LDL < 70 mg/dl - PT consult, OT consult, Speech consult - Neurology input appreciated  3. Altered mental status - Likely due to recent acute CVA. Mental status now improved - Continue to monitor - Will avoid Benzos - LP deferred due to improving mental status   4. Type 2 diabetes mellitus without complication, without long-term current use of insulin (A1c 5.7% - 08/27/18) - Well controlled. No changes to current regimen. - Low intensity sliding scale insulin coverage - ADA 2100 calorie diet  - FS BS qac and qhs  5. Essential hypertension -  controlled - Prn Hydralazine  6. COPD - No evidence of exacerbation -  Continue DuoNeb PRN  7.  DVT prophylaxis -Lovenox held due to recent GI bleed  Patients' daughter updated on plan of care and current progress.  All the records are reviewed and case discussed with Care Management/Social Worker. Management plans discussed with the patient, family and they are in agreement.  CODE STATUS: DNR  TOTAL TIME TAKING CARE OF THIS PATIENT: 38 minutes.   More than 50% of the time was spent in counseling/coordination of care: YES  POSSIBLE D/C IN 3 DAYS, DEPENDING ON CLINICAL CONDITION.   on 08/31/2018 at 1:24 PM  This patient was staffed with Dr. Vaughan Basta who personally evaluated patient, reviewed documentation and agreed with assessment and plan of care as above.  Rufina Falco, DNP, FNP-BC [facility] Nurse Practitioner   Between 7am to 6pm - Pager [phone]  After 6pm go to [email or web address] - Proofreader  [facility]  Office  [phone]  CC: Primary care physician; Dion Body, MD  Note: This dictation was prepared with Dragon dictation along with smaller phrase technology. Any transcriptional errors that result from this process are unintentional.

## 2018-08-31 NOTE — Progress Notes (Signed)
TC report given to Endo nurse; pt ready for procedure.

## 2018-08-31 NOTE — Progress Notes (Signed)
Daily Progress Note   Patient Name: Alexa Lowe       Date: 08/31/2018 DOB: Jul 19, 1930  Age: 85 y.o. MRN#: [id number] Attending Physician: Vaughan Basta, * Primary Care Physician: Dion Body, MD Admit Date: 08/23/2018  Reason for Consultation/Follow-up: Psychosocial/spiritual support  Subjective: Patient resting in bed. She is conversive. She was hopeful for discharge but her HGB began to drop, and this is currently being worked up. She states she is supposed to be having a procedure today. She states she is not worried, that God is in control, and whatever happens, happens. She states she has a living will. She states it advises that she does not want to be kept alive by artificial means. She states she would not want a feeding tube, ventilator, or CPR. She would like to focus on treating the treatable at this time.    Length of Stay: 6  Current Medications: Scheduled Meds:  . atorvastatin  40 mg Oral QHS  . citalopram  30 mg Oral Daily  . gabapentin  100 mg Oral TID  . Melatonin  10 mg Oral QHS  . nystatin ointment  1 application Topical BID  . pantoprazole (PROTONIX) IV  40 mg Intravenous Q12H  . QUEtiapine  25 mg Oral QHS  . sodium chloride flush  3 mL Intravenous Q12H  . sucralfate  1 g Oral QID    Continuous Infusions: . sodium chloride 100 mL/hr at 08/31/18 1100  . pantoprozole (PROTONIX) infusion 8 mg/hr (08/31/18 1100)    PRN Meds: acetaminophen **OR** acetaminophen (TYLENOL) oral liquid 160 mg/5 mL **OR** acetaminophen, albuterol, hydrALAZINE, ondansetron (ZOFRAN) IV, sodium chloride flush  Physical Exam Pulmonary:     Effort: Pulmonary effort is normal.  Neurological:     Mental Status: She is alert.             Vital Signs: BP (!) 146/59 (BP  Location: Right Arm)   Pulse (!) 59   Temp 98 F (36.7 C) (Oral)   Resp 20   Ht 5\' 6"  (1.676 m)   Wt 91 kg   SpO2 95%   BMI 32.38 kg/m  SpO2: SpO2: 95 % O2 Device: O2 Device: Room Air O2 Flow Rate:    Intake/output summary:   Intake/Output Summary (Last 24 hours) at 08/31/2018 1201 Last data filed at 08/31/2018  1100 Gross per 24 hour  Intake 3718.11 ml  Output 3400 ml  Net 318.11 ml   LBM: Last BM Date: 08/30/18 Baseline Weight: Weight: 90.7 kg Most recent weight: Weight: 91 kg       Palliative Assessment/Data:    Flowsheet Rows     Most Recent Value  Intake Tab  Referral Department  Hospitalist  Unit at Time of Referral  Med/Surg Unit  Palliative Care Primary Diagnosis  Neurology  Date Notified  08/27/18  Palliative Care Type  New Palliative care  Reason for referral  Clarify Goals of Care, Non-pain Symptom  Date of Admission  08/23/18  # of days IP prior to Palliative referral  4  Clinical Assessment  Psychosocial & Spiritual Assessment  Palliative Care Outcomes      Patient Active Problem List   Diagnosis Date Noted  . Encephalopathy 08/25/2018  . TIA (transient ischemic attack) 08/24/2018  . Symptomatic anemia 05/19/2018  . HTN (hypertension) 05/19/2018  . Diabetes ([place]) 05/19/2018  . COPD (chronic obstructive pulmonary disease) ([place]) 05/19/2018    Palliative Care Assessment & Plan    Recommendations/Plan:  Recommend palliative to follow at D/C.   Will continue to follow.   Code Status:    Code Status Orders  (From admission, onward)         Start     Ordered   08/24/18 1444  Do not attempt resuscitation (DNR)  Continuous    Question Answer Comment  In the event of cardiac or respiratory ARREST Do not call a "code blue"   In the event of cardiac or respiratory ARREST Do not perform Intubation, CPR, defibrillation or ACLS   In the event of cardiac or respiratory ARREST Use medication by any route, position, wound care, and other  measures to relive pain and suffering. May use oxygen, suction and manual treatment of airway obstruction as needed for comfort.      08/24/18 1443        Code Status History    Date Active Date Inactive Code Status Order ID Comments User Context   05/20/2018 1219 05/21/2018 1834 DNR [id number]  Dustin Flock, MD Inpatient   05/20/2018 0006 05/20/2018 1219 Full Code [id number]  Lance Coon, MD Inpatient       Prognosis:   Unable to determine  Discharge Planning:  Home with Palliative Services    Thank you for allowing the Palliative Medicine Team to assist in the care of this patient.   Total Time 25 min Prolonged Time Billed  NO      Greater than 50%  of this time was spent counseling and coordinating care related to the above assessment and plan.  Asencion Gowda, NP  Please contact Palliative Medicine Team phone at [phone] for questions and concerns.

## 2018-08-31 NOTE — Anesthesia Post-op Follow-up Note (Signed)
Anesthesia QCDR form completed.        

## 2018-08-31 NOTE — Care Management Important Message (Signed)
Important Message  Patient Details  Name: Alexa Lowe MRN: [id number] Date of Birth: 1930-05-09   Medicare Important Message Given:  Yes    Dannette Barbara 08/31/2018, 10:28 AM

## 2018-08-31 NOTE — Op Note (Signed)
[facility] Gastroenterology Patient Name: Alexa Lowe Procedure Date: 08/31/2018 1:07 PM MRN: [id number] Account #: [id number] Date of Birth: November 20, 1930 Admit Type: Inpatient Age: 85 Room: [facility] ENDO ROOM 4 Gender: Female Note Status: Finalized Procedure:            Upper GI endoscopy Indications:          Acute post hemorrhagic anemia, Melena Providers:            Lin Landsman MD, MD Referring MD:         Dion Body (Referring MD) Medicines:            Monitored Anesthesia Care Complications:        No immediate complications. Estimated blood loss: None. Procedure:            Pre-Anesthesia Assessment:                       - Prior to the procedure, a History and Physical was                        performed, and patient medications and allergies were                        reviewed. The patient is competent. The risks and                        benefits of the procedure and the sedation options and                        risks were discussed with the patient. All questions                        were answered and informed consent was obtained.                        Patient identification and proposed procedure were                        verified by the physician, the nurse, the                        anesthesiologist, the anesthetist and the technician in                        the pre-procedure area in the procedure room in the                        endoscopy suite. Mental Status Examination: alert and                        oriented. Airway Examination: normal oropharyngeal                        airway and neck mobility. Respiratory Examination:                        clear to auscultation. CV Examination: normal.                        Prophylactic Antibiotics: The patient does not require  prophylactic antibiotics. Prior Anticoagulants: The                        patient has taken Plavix (clopidogrel), last dose was 3                         days prior to procedure. ASA Grade Assessment: III - A                        patient with severe systemic disease. After reviewing                        the risks and benefits, the patient was deemed in                        satisfactory condition to undergo the procedure. The                        anesthesia plan was to use monitored anesthesia care                        (MAC). Immediately prior to administration of                        medications, the patient was re-assessed for adequacy                        to receive sedatives. The heart rate, respiratory rate,                        oxygen saturations, blood pressure, adequacy of                        pulmonary ventilation, and response to care were                        monitored throughout the procedure. The physical status                        of the patient was re-assessed after the procedure.                       After obtaining informed consent, the endoscope was                        passed under direct vision. Throughout the procedure,                        the patient's blood pressure, pulse, and oxygen                        saturations were monitored continuously. The Endoscope                        was introduced through the mouth, and advanced to the                        second part of duodenum. The upper GI endoscopy was  accomplished without difficulty. The patient tolerated                        the procedure well. Findings:      The duodenal bulb and second portion of the duodenum were normal.      One non-bleeding cratered gastric ulcer with a clean ulcer base (Forrest       Class III) was found in the gastric antrum. The lesion was 12 mm in       largest dimension. Biopsies were taken with a cold forceps for       Helicobacter pylori testing.      The gastroesophageal junction and examined esophagus were normal. Impression:           - Normal  duodenal bulb and second portion of the                        duodenum.                       - Non-bleeding gastric ulcer with a clean ulcer base                        (Forrest Class III). Biopsied.                       - Normal gastroesophageal junction and esophagus. Recommendation:       - Await pathology results.                       - Return patient to hospital ward for ongoing care.                       - Resume previous diet today.                       - Continue present medications.                       - Use Prilosec (omeprazole) 40 mg PO BID rest of life                       - Can resume plavix as outpt in 2 weeks                       - Avoid NSAIDs and high dose aspirin                       - Recommend inpatient colonoscopy if pt's hemoglobin                        continues to drop. She has burgundy colored blood mixed                        with stool on rectal exam today likely from gastric                        ulcer                       - Repeat EGD in 20month to confirm healing of gastric  ulcer                       - Follow up with Dr Alice Reichert as outpt Procedure Code(s):    --- Professional ---                       [phone], Esophagogastroduodenoscopy, flexible, transoral;                        with biopsy, single or multiple Diagnosis Code(s):    --- Professional ---                       K25.9, Gastric ulcer, unspecified as acute or chronic,                        without hemorrhage or perforation                       D62, Acute posthemorrhagic anemia                       K92.1, Melena (includes Hematochezia) CPT copyright 2019 American Medical Association. All rights reserved. The codes documented in this report are preliminary and upon coder review may  be revised to meet current compliance requirements. Dr. Ulyess Mort Lin Landsman MD, MD 08/31/2018 1:53:58 PM This report has been signed electronically. Number of  Addenda: 0 Note Initiated On: 08/31/2018 1:07 PM      [facility]

## 2018-08-31 NOTE — Transfer of Care (Signed)
Immediate Anesthesia Transfer of Care Note  Patient: Alexa Lowe  Procedure(s) Performed: ESOPHAGOGASTRODUODENOSCOPY (EGD) (N/A )  Patient Location: Endoscopy Unit  Anesthesia Type:General  Level of Consciousness: drowsy and patient cooperative  Airway & Oxygen Therapy: Patient Spontanous Breathing and Patient connected to nasal cannula oxygen  Post-op Assessment: Report given to RN and Post -op Vital signs reviewed and stable  Post vital signs: Reviewed and stable  Last Vitals:  Vitals Value Taken Time  BP 122/50 08/31/2018  1:46 PM  Temp    Pulse    Resp 12 08/31/2018  1:46 PM  SpO2 99 % 08/31/2018  1:46 PM    Last Pain:  Vitals:   08/31/18 0820  TempSrc:   PainSc: 3          Complications: No apparent anesthesia complications

## 2018-08-31 NOTE — Anesthesia Postprocedure Evaluation (Signed)
Anesthesia Post Note  Patient: Alexa Lowe  Procedure(s) Performed: ESOPHAGOGASTRODUODENOSCOPY (EGD) (N/A )  Patient location during evaluation: Endoscopy Anesthesia Type: General Level of consciousness: awake and alert Pain management: pain level controlled Vital Signs Assessment: post-procedure vital signs reviewed and stable Respiratory status: spontaneous breathing, nonlabored ventilation, respiratory function stable and patient connected to nasal cannula oxygen Cardiovascular status: blood pressure returned to baseline and stable Postop Assessment: no apparent nausea or vomiting Anesthetic complications: no     Last Vitals:  Vitals:   08/31/18 1400 08/31/18 1410  BP: (!) 132/50 (!) 131/56  Pulse: (!) 55 (!) 57  Resp: 16 18  Temp:    SpO2: 94% 97%    Last Pain:  Vitals:   08/31/18 1350  TempSrc: Tympanic  PainSc:                  Martha Clan

## 2018-08-31 NOTE — Progress Notes (Signed)
EDG done today with large gastric ulcer noted with no active bleeding with pt tolerating procedure well. Reports she is hungry, but eats small amounts with limited appetite. No stools thus far today or sign/symptoms bleeding. VSS. Hgb holding at 8.6 this a.m.

## 2018-08-31 NOTE — Anesthesia Preprocedure Evaluation (Signed)
Anesthesia Evaluation  Patient identified by MRN, date of birth, ID band Patient awake    Reviewed: Allergy & Precautions, H&P , NPO status , Patient's Chart, lab work & pertinent test results, reviewed documented beta blocker date and time   History of Anesthesia Complications Negative for: history of anesthetic complications  Airway Mallampati: II  TM Distance: >3 FB Neck ROM: full    Dental  (+) Edentulous Upper, Edentulous Lower, Upper Dentures, Lower Dentures, Dental Advidsory Given   Pulmonary neg shortness of breath, neg sleep apnea, COPD, neg recent URI, former smoker,    Pulmonary exam normal        Cardiovascular Exercise Tolerance: Good hypertension, (-) angina(-) CAD, (-) Past MI, (-) Cardiac Stents and (-) CABG (-) dysrhythmias (-) Valvular Problems/Murmurs     Neuro/Psych TIACVA, Residual Symptoms negative psych ROS   GI/Hepatic Neg liver ROS, GERD  ,  Endo/Other  diabetes  Renal/GU negative Renal ROS  negative genitourinary   Musculoskeletal   Abdominal   Peds  Hematology  (+) Blood dyscrasia, anemia ,   Anesthesia Other Findings Past Medical History: No date: Cancer ([place])     Comment:  Right breast No date: COPD (chronic obstructive pulmonary disease) ([facility]) No date: Diabetes mellitus without complication ([facility]) No date: Hypertension   Reproductive/Obstetrics negative OB ROS                             Anesthesia Physical  Anesthesia Plan  ASA: III  Anesthesia Plan: General   Post-op Pain Management:    Induction: Intravenous  PONV Risk Score and Plan: 3 and Propofol infusion and TIVA  Airway Management Planned: Natural Airway and Nasal Cannula  Additional Equipment:   Intra-op Plan:   Post-operative Plan:   Informed Consent: I have reviewed the patients History and Physical, chart, labs and discussed the procedure including the risks, benefits and  alternatives for the proposed anesthesia with the patient or authorized representative who has indicated his/her understanding and acceptance.     Dental Advisory Given  Plan Discussed with: Anesthesiologist, CRNA and Surgeon  Anesthesia Plan Comments:         Anesthesia Quick Evaluation

## 2018-09-01 LAB — CBC
HCT: 26.4 % — ABNORMAL LOW (ref 36.0–46.0)
Hemoglobin: 8.7 g/dL — ABNORMAL LOW (ref 12.0–15.0)
MCH: 29.2 pg (ref 26.0–34.0)
MCHC: 33 g/dL (ref 30.0–36.0)
MCV: 88.6 fL (ref 80.0–100.0)
Platelets: 189 10*3/uL (ref 150–400)
RBC: 2.98 MIL/uL — ABNORMAL LOW (ref 3.87–5.11)
RDW: 16.5 % — ABNORMAL HIGH (ref 11.5–15.5)
WBC: 10.2 10*3/uL (ref 4.0–10.5)
nRBC: 0 % (ref 0.0–0.2)

## 2018-09-01 LAB — GLUCOSE, CAPILLARY
Glucose-Capillary: 120 mg/dL — ABNORMAL HIGH (ref 70–99)
Glucose-Capillary: 233 mg/dL — ABNORMAL HIGH (ref 70–99)
Glucose-Capillary: 248 mg/dL — ABNORMAL HIGH (ref 70–99)
Glucose-Capillary: 108 mg/dL — ABNORMAL HIGH (ref 70–99)

## 2018-09-01 MED ORDER — METFORMIN HCL 500 MG PO TABS
500.0000 mg | ORAL_TABLET | Freq: Two times a day (BID) | ORAL | Status: DC
  Administered 2018-09-01 – 2018-09-02 (×2): 500 mg via ORAL
  Filled 2018-09-01 (×3): qty 1

## 2018-09-01 MED ORDER — INSULIN ASPART 100 UNIT/ML ~~LOC~~ SOLN
0.0000 [IU] | Freq: Three times a day (TID) | SUBCUTANEOUS | Status: DC
  Administered 2018-09-01: 3 [IU] via SUBCUTANEOUS
  Administered 2018-09-02 (×2): 1 [IU] via SUBCUTANEOUS
  Filled 2018-09-01 (×3): qty 1

## 2018-09-01 MED ORDER — CALCIUM CARBONATE ANTACID 500 MG PO CHEW
2.0000 | CHEWABLE_TABLET | Freq: Four times a day (QID) | ORAL | Status: DC | PRN
  Administered 2018-09-01: 400 mg via ORAL
  Filled 2018-09-01: qty 2

## 2018-09-01 MED ORDER — INSULIN ASPART 100 UNIT/ML ~~LOC~~ SOLN: 0.0000 [IU] | Freq: Every day | SUBCUTANEOUS | Status: DC

## 2018-09-01 NOTE — Progress Notes (Signed)
[facility] NAME: Alexa Lowe    MR#:  [id number]  DATE OF BIRTH:  November 05, 1930  SUBJECTIVE:   Chief Complaint  Patient presents with  . Altered Mental Status   Patient is seen at the bedside. Patient is found laying in bed in NAD. Overall she feels her condition is gradually improving. Voices no new complaints. No new events reported overnight.  No new episodes of loose tarry stools per nursing staff. Hemoglobin stable.  REVIEW OF SYSTEMS:  Review of Systems  Constitutional: Negative for chills, fever, malaise/fatigue and weight loss.  HENT: Negative for congestion, hearing loss and sore throat.   Eyes: Negative for blurred vision and double vision.  Respiratory: Negative for cough, shortness of breath and wheezing.   Cardiovascular: Negative for chest pain, palpitations, orthopnea and leg swelling.  Gastrointestinal: Negative for abdominal pain, diarrhea, nausea and vomiting.  Genitourinary: Negative for dysuria and urgency.  Musculoskeletal: Negative for myalgias.  Skin: Negative for rash.  Neurological: Negative for dizziness, sensory change, speech change, focal weakness and headaches.  Psychiatric/Behavioral: Negative for depression.   DRUG ALLERGIES:   Allergies  Allergen Reactions  . Morphine And Related Other (See Comments)    Reaction:  Hallucinations   . Codeine Nausea And Vomiting   VITALS:  Blood pressure (!) 122/58, pulse (!) 59, temperature 98.2 F (36.8 C), temperature source Oral, resp. rate 16, height 5\' 6"  (1.676 m), weight 91 kg, SpO2 96 %. PHYSICAL EXAMINATION:   GENERAL:  85 y.o.-year-old patient lying in the bed with no acute distress.  EYES: Pupils equal, round, reactive to light and accommodation. No scleral icterus. Extraocular muscles intact.  HEENT: Head atraumatic, normocephalic. Oropharynx and nasopharynx clear.  NECK:  Supple, no jugular venous distention. No thyroid enlargement, no  tenderness.  LUNGS: Normal breath sounds bilaterally, no wheezing, rales,rhonchi or crepitation. No use of accessory muscles of respiration.  CARDIOVASCULAR: S1, S2 normal. No murmurs, rubs, or gallops.  ABDOMEN: Soft, nontender, nondistended. Bowel sounds present. No organomegaly or mass.  EXTREMITIES: No pedal edema, cyanosis, or clubbing.  NEUROLOGIC: Cranial nerves II through XII are intact. Muscle strength 4/5 in all extremities. Sensation intact. Gait not checked.  PSYCHIATRIC: The patient is alert and oriented x 3.  SKIN: No obvious rash, lesion, or ulcer.   DATA REVIEWED:  LABORATORY PANEL:  Female CBC Recent Labs  Lab 09/01/18 0601  WBC 10.2  HGB 8.7*  HCT 26.4*  PLT 189   ------------------------------------------------------------------------------------------------------------------ Chemistries  Recent Labs  Lab 08/31/18 0525  NA 142  K 3.5  CL 114*  CO2 21*  GLUCOSE 101*  BUN 12  CREATININE 0.81  CALCIUM 8.4*   RADIOLOGY:  No results found. ASSESSMENT AND PLAN:   85 y.o. female  presenting with altered mental status found to have acute ischemic stroke on MRI brain. Patient started on DUAP however was noted to with an episode of hematochezia.  1. Acute GI bleed - Likely Lower GI bleed without evidence of hemodynamic instability.  - Recent EGD on 05/2018 due to anemia and melena showed a nonbleeding gastric antral ulcer.  - CTA  Abdomen and pelvis reviewed and shows no source of bleed. - S/p PRBCs on 5/23 for hgb of 6.4 with improvement in hgb to 8.6  - Continue to monitor H&H  - Pantoprazole 80mg  IV gtt 8mg /hr-changed to Protonix oral twice daily.  Added sucralfate. - Hold NSAIDs, steroids, ASA - Neurology clearance obtained for procedure - GI  had done EGD which showed gastric ulcer but no active bleed, PPI and sucralfate started. suggested to hold Plavix for 2 weeks and then can resume as outpatient.  Follow-up in GI clinic.  2. Acute CVA - brain reviewed  and shows punctate 4 mm focus of diffusion abnormality involving the left parietal cortex - Patient was on DUAP however this is being held due to recent acute GI bleed - US carotids shows no hemodynamically significant stenosis - Echocardiogram shows no cardiac source of emboli - Continue statin with goal LDL < 70 mg/dl - PT consult, OT consult, Speech consult - Neurology input appreciated  3. Altered mental status - Likely due to recent acute CVA. Mental status now improved - Continue to monitor - Will avoid Benzos - LP deferred due to improving mental status   4. Type 2 diabetes mellitus without complication, without long-term current use of insulin (A1c 5.7% - 08/27/18) - Well controlled. No changes to current regimen. - Low intensity sliding scale insulin coverage - ADA 2100 calorie diet  - FS BS qac and qhs  5. Essential hypertension - controlled - Prn Hydralazine  6. COPD - No evidence of exacerbation - Continue DuoNeb PRN  7.  DVT prophylaxis -Lovenox held due to recent GI bleed  Patients' daughter updated on plan of care and current progress. PT had suggested home health arrangements.  Likely discharge tomorrow if remains stable  All the records are reviewed and case discussed with Care Management/Social Worker. Management plans discussed with the patient, family and they are in agreement.  CODE STATUS: DNR  TOTAL TIME TAKING CARE OF THIS PATIENT: 38 minutes.   More than 50% of the time was spent in counseling/coordination of care: YES  POSSIBLE D/C IN 3 DAYS, DEPENDING ON CLINICAL CONDITION.   on 09/01/2018 at 4:13 PM  This patient was staffed with Dr. Vaughan Basta who personally evaluated patient, reviewed documentation and agreed with assessment and plan of care as above.  Rufina Falco, DNP, FNP-BC [facility] Nurse Practitioner   Between 7am to 6pm - Pager [phone]   After 6pm go to [email or web address] - Proofreader  [facility]  Office  [phone]  CC: Primary care physician; Dion Body, MD  Note: This dictation was prepared with Dragon dictation along with smaller phrase technology. Any transcriptional errors that result from this process are unintentional.

## 2018-09-01 NOTE — Progress Notes (Signed)
Jonathon Bellows , MD [address] [place], [place], [place], [address] [address], [place], [place], [place], [address] Phone: [phone]  Fax: [phone]   Alexa Lowe is being followed for GI bleed   Subjective: Alexa Lowe other complaints    Objective: Vital signs in last 24 hours: Vitals:   08/31/18 1456 08/31/18 1648 08/31/18 2026 09/01/18 0517  BP: (!) 151/63 (!) 141/76 (!) 150/66 (!) 122/58  Pulse: 62 61 62 (!) 59  Resp: (!) 22  16 16   Temp: 97.8 F (36.6 C) 98.2 F (36.8 C) 98.7 F (37.1 C) 98.2 F (36.8 C)  TempSrc:  Oral Oral Oral  SpO2: 98% 96% 96% 93%  Weight:      Height:       Weight change:   Intake/Output Summary (Last 24 hours) at 09/01/2018 1008 Last data filed at 09/01/2018 0500 Gross per 24 hour  Intake 899.56 ml  Output 1000 ml  Net -100.44 ml     Exam: Heart:: Regular rate and rhythm, S1S2 present or without murmur or extra heart sounds Lungs: normal, clear to auscultation and clear to auscultation and percussion Abdomen: soft, nontender, normal bowel sounds   Lab Results: @LABTEST2 @ Micro Results: Recent Results (from the past 240 hour(s))  SARS Coronavirus 2 (CEPHEID - Performed in [place] hospital lab), Hosp Order     Status: None   Collection Time: 08/24/18  8:35 AM  Result Value Ref Range Status   SARS Coronavirus 2 NEGATIVE NEGATIVE Final    Comment: (NOTE) If result is NEGATIVE SARS-CoV-2 target nucleic acids are NOT DETECTED. The SARS-CoV-2 RNA is generally detectable in upper and lower  respiratory specimens during the acute phase of infection. The lowest  concentration of SARS-CoV-2 viral copies this assay can detect is 250  copies / mL. A negative result does not preclude SARS-CoV-2 infection  and should not be used as the sole basis for treatment or other  patient management decisions.  A negative result may occur with  improper specimen collection / handling, submission of specimen other  than  nasopharyngeal swab, presence of viral mutation(s) within the  areas targeted by this assay, and inadequate number of viral copies  (<250 copies / mL). A negative result must be combined with clinical  observations, patient history, and epidemiological information. If result is POSITIVE SARS-CoV-2 target nucleic acids are DETECTED. The SARS-CoV-2 RNA is generally detectable in upper and lower  respiratory specimens dur ing the acute phase of infection.  Positive  results are indicative of active infection with SARS-CoV-2.  Clinical  correlation with patient history and other diagnostic information is  necessary to determine patient infection status.  Positive results do  not rule out bacterial infection or co-infection with other viruses. If result is PRESUMPTIVE POSTIVE SARS-CoV-2 nucleic acids MAY BE PRESENT.   A presumptive positive result was obtained on the submitted specimen  and confirmed on repeat testing.  While 2019 novel coronavirus  (SARS-CoV-2) nucleic acids may be present in the submitted sample  additional confirmatory testing may be necessary for epidemiological  and / or clinical management purposes  to differentiate between  SARS-CoV-2 and other Sarbecovirus currently known to infect humans.  If clinically indicated additional testing with an alternate test  methodology [phone]) is advised. The SARS-CoV-2 RNA is generally  detectable in upper and lower respiratory sp ecimens during the acute  phase of infection. The expected result is Negative. Fact Sheet for Patients:  StrictlyIdeas.no Fact Sheet for Healthcare  Providers: BankingDealers.co.za This test is not yet approved or cleared by the [place] and has been authorized for detection and/or diagnosis of SARS-CoV-2 by FDA under an Emergency Use Authorization (EUA).  This EUA will remain in effect (meaning this test can be used) for the duration of the  COVID-19 declaration under Section 564(b)(1) of the Act, 21 U.S.C. section 360bbb-3(b)(1), unless the authorization is terminated or revoked sooner. Performed at [facility], [address], [place], [place] [address]    Studies/Results: No results found. Medications: I have reviewed the patient's current medications. Scheduled Meds: . atorvastatin  40 mg Oral QHS  . citalopram  30 mg Oral Daily  . feeding supplement  1 Container Oral TID BM  . gabapentin  100 mg Oral TID  . Melatonin  10 mg Oral QHS  . nystatin ointment  1 application Topical BID  . pantoprazole  40 mg Oral BID AC  . QUEtiapine  25 mg Oral QHS  . sodium chloride flush  3 mL Intravenous Q12H  . sucralfate  1 g Oral QID   Continuous Infusions: . sodium chloride 50 mL/hr at 08/31/18 2100   PRN Meds:.acetaminophen **OR** acetaminophen (TYLENOL) oral liquid 160 mg/5 mL **OR** acetaminophen, albuterol, hydrALAZINE, ondansetron (ZOFRAN) IV, sodium chloride flush   Assessment: Active Problems:   TIA (transient ischemic attack)   Encephalopathy  Alexa Lowe 85 y.o. female admitted with Melena. EGD showed clean based gastric ulcer that was not bleeding and didn't require endotherapy. Hb stable. Ferritin low at 12. Hb stable  Plan: 1. Commence on clears and gradually advance 2. Can commence on oral iron at discharge as has low ferritin 3. F/u with Dr Alice Reichert as an outpatient - will require repeat EGD in 8-10 weeks to confirm healing  4. Check H pylori stool, antigen 5. No NSAID's 6. When she goes home will need PPI omeprazole 40 mg once a day till repeat EGD confirms healing.   I will sign off.  Please call me if any further GI concerns or questions.  We would like to thank you for the opportunity to participate in the care of Alexa Lowe.     LOS: 7 days   Jonathon Bellows, MD 09/01/2018, 10:08 AM

## 2018-09-01 NOTE — TOC Progression Note (Signed)
Transition of Care [facility]) - Progression Note    Patient Details  Name: Alexa Lowe MRN: [id number] Date of Birth: November 13, 1930  Transition of Care [facility]) CM/SW [place], [place] Phone Number: 09/01/2018, 8:22 PM  Clinical Narrative:   CSW received phone call from patient's daughter Atha Starks. Daughter states that the doctor told her that patient will discharge home tomorrow and she is concerned because the Nurse Practitioner told her yesterday that the social worker is working on placement for patient in assisted living. CSW explained that the Nurse Practitioner was incorrect. CSW explained that [facility] team does not usually place people in assisted living from the hospital due to insurance issues and beds not readily available. CSW offered to give daughter a list of ALF in [place] and surrounding area. Daughter states that patient would not be willing to go to an ALF and she already has a list. CSW explained that patient is not a candidate for SNF for rehab at this time per PT eval. Daughter agrees with this. Daughter states that she does not think patient would agree with going to a facility at this time anyways but she is concerned about patient being on her own Per daughter she will be able to stay with patient but only until Friday. CSW explained that patient has been set up with home health services but recommended that family begin looking into Assisted living facilities and possibly hiring caregivers in the home until a transition can be made. Daughter reports that patient would not be willing to do that. CSW again offered resources for in home care but daughter declined. CSW will continue to follow for discharge planning.      Expected Discharge Plan: [place] Barriers to Discharge: No Barriers Identified  Expected Discharge Plan and Services Expected Discharge Plan: [place]   Discharge Planning Services: CM Consult   Living  arrangements for the past 2 months: Apartment Expected Discharge Date: 08/28/18               DME Arranged: Gilford Rile rolling DME Agency: [employer] Date DME Agency Contacted: 08/28/18 Time DME Agency Contacted: [phone] Representative spoke with at DME Agency: [place]  HH Arranged: RN, PT, OT, Nurse's Aide, Social Work [facility] Agency: [facility] [place] Date [place]: 08/28/18 Time [place]: 1109 Representative spoke with at [place]: [place] ([place]) Interventions    Readmission Risk Interventions No flowsheet data found.

## 2018-09-01 NOTE — Progress Notes (Signed)
Physical Therapy Treatment Patient Details Name: Alexa Lowe MRN: [id number] DOB: 03-May-1930 Today's Date: 09/01/2018    History of Present Illness Pt. is an 85 y.o. female who was amitted to [facility]  with confusion, and impaired speech. Imaging revealed 4 mm focus of diffusion abnormality at the Left Parietal cortex suspicious for small acute infarct. Pt. PMHx includes: COPD, Type 2 DM, Breast CA, HTN, Acute Encephalopathy, TIA, COPD, Hyperlipidemia, and HTN     PT Comments    Pt is making good progress towards goals, however limited this date secondary to dizziness. Pt able to perform mobility with safe technique with RW. Appears motivated to perform therapy. Will continue to progress.   Follow Up Recommendations  Home health PT     Equipment Recommendations  Rolling walker with 5" wheels    Recommendations for Other Services       Precautions / Restrictions Precautions Precautions: Fall Restrictions Weight Bearing Restrictions: No    Mobility  Bed Mobility Overal bed mobility: Modified Independent             General bed mobility comments: safe technique performed, however once seated at EOB, became very dizzy, took a few minutes to recover.  Transfers Overall transfer level: Needs assistance Equipment used: Rolling walker (2 wheeled) Transfers: Sit to/from Stand Sit to Stand: Min guard         General transfer comment: Safe technique with upright posture. RW used for safety due to dizziness  Ambulation/Gait Ambulation/Gait assistance: Min Conservator, museum/gallery (Feet): 10 Feet Assistive device: Rolling walker (2 wheeled) Gait Pattern/deviations: Step-through pattern     General Gait Details: ambulated towards chair, however still complained of dizziness therefore further ambulation deferred. Safe technique with RW, however appears fatigued   Marine scientist [place] (Stroke Patients Only)       Balance                                             Cognition Arousal/Alertness: Awake/alert Behavior During Therapy: WFL for tasks assessed/performed Overall Cognitive Status: No family/caregiver present to determine baseline cognitive functioning                                        Exercises Other Exercises Other Exercises: supine ther-ex performed x 10 reps including SLRs and hip abd/add. Safe technique performed.    General Comments        Pertinent Vitals/Pain Pain Assessment: No/denies pain    Home Living                      Prior Function            PT Goals (current goals can now be found in the care plan section) Acute Rehab PT Goals Patient Stated Goal: to go home tomorrow PT Goal Formulation: With patient Time For Goal Achievement: 09/10/18 Potential to Achieve Goals: Fair Progress towards PT goals: Progressing toward goals    Frequency    Min 2X/week      PT Plan Current plan remains appropriate    Co-evaluation              AM-PAC PT "6 Clicks" Mobility  Outcome Measure  Help needed turning from your back to your side while in a flat bed without using bedrails?: None Help needed moving from lying on your back to sitting on the side of a flat bed without using bedrails?: None Help needed moving to and from a bed to a chair (including a wheelchair)?: A Little Help needed standing up from a chair using your arms (e.g., wheelchair or bedside chair)?: A Little Help needed to walk in hospital room?: A Little Help needed climbing 3-5 steps with a railing? : A Little 6 Click Score: 20    End of Session Equipment Utilized During Treatment: Gait belt Activity Tolerance: Patient tolerated treatment well Patient left: in chair;with chair alarm set Nurse Communication: Mobility status PT Visit Diagnosis: Muscle weakness (generalized) (M62.81);Difficulty in walking, not elsewhere classified (R26.2)     Time:  2633-3545 PT Time Calculation (min) (ACUTE ONLY): 14 min  Charges:  $Gait Training: 8-22 mins                     Greggory Stallion, [place], DPT [phone]    [name] 09/01/2018, 12:53 PM

## 2018-09-02 DIAGNOSIS — K254 Chronic or unspecified gastric ulcer with hemorrhage: Principal | ICD-10-CM

## 2018-09-02 LAB — GLUCOSE, CAPILLARY
Glucose-Capillary: 119 mg/dL — ABNORMAL HIGH (ref 70–99)
Glucose-Capillary: 136 mg/dL — ABNORMAL HIGH (ref 70–99)
Glucose-Capillary: 112 mg/dL — ABNORMAL HIGH (ref 70–99)
Glucose-Capillary: 128 mg/dL — ABNORMAL HIGH (ref 70–99)
Glucose-Capillary: 150 mg/dL — ABNORMAL HIGH (ref 70–99)

## 2018-09-02 MED ORDER — CLOPIDOGREL BISULFATE 75 MG PO TABS: 75.0000 mg | ORAL_TABLET | Freq: Every day | ORAL | 0 refills | Status: AC

## 2018-09-02 MED ORDER — GABAPENTIN 100 MG PO CAPS: 100.0000 mg | ORAL_CAPSULE | Freq: Three times a day (TID) | ORAL | 0 refills | Status: DC

## 2018-09-02 MED ORDER — ASPIRIN 81 MG PO TBEC: 81.0000 mg | DELAYED_RELEASE_TABLET | Freq: Every day | ORAL | 0 refills | Status: AC

## 2018-09-02 NOTE — Progress Notes (Signed)
Daily Progress Note   Patient Name: Alexa Lowe       Date: 09/02/2018 DOB: 13-Jun-1930  Age: 85 y.o. MRN#: [id number] Attending Physician: Gorden Harms, MD Primary Care Physician: Dion Body, MD Admit Date: 08/23/2018  Reason for Consultation/Follow-up: Psychosocial/spiritual support  Subjective: Patient resting in bed. She states she is being discharged today, and is eager. She states she is feeling better. She tells me she is thankful for the care provided during her admission.    Length of Stay: 8  Current Medications: Scheduled Meds:  . atorvastatin  40 mg Oral QHS  . citalopram  30 mg Oral Daily  . feeding supplement  1 Container Oral TID BM  . gabapentin  100 mg Oral TID  . insulin aspart  0-5 Units Subcutaneous QHS  . insulin aspart  0-9 Units Subcutaneous TID WC  . Melatonin  10 mg Oral QHS  . metFORMIN  500 mg Oral BID WC  . nystatin ointment  1 application Topical BID  . pantoprazole  40 mg Oral BID AC  . QUEtiapine  25 mg Oral QHS  . sodium chloride flush  3 mL Intravenous Q12H  . sucralfate  1 g Oral QID    Continuous Infusions:   PRN Meds: acetaminophen **OR** acetaminophen (TYLENOL) oral liquid 160 mg/5 mL **OR** acetaminophen, albuterol, calcium carbonate, hydrALAZINE, ondansetron (ZOFRAN) IV, sodium chloride flush  Physical Exam Pulmonary:     Effort: Pulmonary effort is normal.  Neurological:     Mental Status: She is alert.             Vital Signs: BP (!) 156/70 (BP Location: Right Arm)   Pulse 62   Temp 98.6 F (37 C) (Oral)   Resp 16   Ht 5\' 6"  (1.676 m)   Wt 91 kg   SpO2 98%   BMI 32.38 kg/m  SpO2: SpO2: 98 % O2 Device: O2 Device: Room Air O2 Flow Rate: O2 Flow Rate (L/min): 2 L/min  Intake/output summary:    Intake/Output Summary (Last 24 hours) at 09/02/2018 1214 Last data filed at 09/02/2018 1137 Gross per 24 hour  Intake 360 ml  Output 1250 ml  Net -890 ml   LBM: Last BM Date: 09/01/18 Baseline Weight: Weight: 90.7 kg Most recent weight: Weight: 91 kg  Palliative Assessment/Data:    Flowsheet Rows     Most Recent Value  Intake Tab  Referral Department  Hospitalist  Unit at Time of Referral  Med/Surg Unit  Palliative Care Primary Diagnosis  Neurology  Date Notified  08/27/18  Palliative Care Type  New Palliative care  Reason for referral  Clarify Goals of Care, Non-pain Symptom  Date of Admission  08/23/18  # of days IP prior to Palliative referral  4  Clinical Assessment  Psychosocial & Spiritual Assessment  Palliative Care Outcomes      Patient Active Problem List   Diagnosis Date Noted  . Encephalopathy 08/25/2018  . TIA (transient ischemic attack) 08/24/2018  . Symptomatic anemia 05/19/2018  . HTN (hypertension) 05/19/2018  . Diabetes ([place]) 05/19/2018  . COPD (chronic obstructive pulmonary disease) ([place]) 05/19/2018    Palliative Care Assessment & Plan    Recommendations/Plan:  Recommend palliative to follow at D/C.   Will continue to follow.   Code Status:    Code Status Orders  (From admission, onward)         Start     Ordered   08/24/18 1444  Do not attempt resuscitation (DNR)  Continuous    Question Answer Comment  In the event of cardiac or respiratory ARREST Do not call a "code blue"   In the event of cardiac or respiratory ARREST Do not perform Intubation, CPR, defibrillation or ACLS   In the event of cardiac or respiratory ARREST Use medication by any route, position, wound care, and other measures to relive pain and suffering. May use oxygen, suction and manual treatment of airway obstruction as needed for comfort.      08/24/18 1443        Code Status History    Date Active Date Inactive Code Status Order ID Comments User Context    05/20/2018 1219 05/21/2018 1834 DNR [id number]  Dustin Flock, MD Inpatient   05/20/2018 0006 05/20/2018 1219 Full Code [id number]  Lance Coon, MD Inpatient       Prognosis:   Unable to determine  Discharge Planning:  Home with Palliative Services    Thank you for allowing the Palliative Medicine Team to assist in the care of this patient.   Total Time 15 min Prolonged Time Billed  NO      Greater than 50%  of this time was spent counseling and coordinating care related to the above assessment and plan.  Asencion Gowda, NP  Please contact Palliative Medicine Team phone at [phone] for questions and concerns.

## 2018-09-02 NOTE — Care Management Important Message (Signed)
Important Message  Patient Details  Name: Alexa Lowe MRN: [id number] Date of Birth: Oct 23, 1930   Medicare Important Message Given:  Yes    Su Hilt, RN 09/02/2018, 9:43 AM

## 2018-09-02 NOTE — Progress Notes (Signed)
Pt is d/ced home w/home health.  This is a little concerning because I think she needs more assistance w/ADLs on a daily basis than home health will be able to provide.  Discussed w/Social Worker who tried to give pt and her daughter options, but ran into barriers.  Pt has urgency when going to BR but says her [facility] is close by.  Also said she has a wheelchair.  Will review d/c information and do stroke education.  Pt's daughter is awaiting my call to pick her up.

## 2018-09-02 NOTE — Discharge Summary (Signed)
[place] [facility] [place] NAME: Alexa Lowe    MR#:  [id number]  DATE OF BIRTH:  [birth year]  DATE OF ADMISSION:  08/23/2018 ADMITTING PHYSICIAN: Saundra Shelling, MD  DATE OF DISCHARGE: No discharge date for patient encounter.  PRIMARY CARE PHYSICIAN: Dion Body, MD    ADMISSION DIAGNOSIS:  TIA (transient ischemic attack) [G45.9] Dysarthria [R47.1] Altered mental status, unspecified altered mental status type [R41.82]  DISCHARGE DIAGNOSIS:  Active Problems:   TIA (transient ischemic attack)   Encephalopathy   SECONDARY DIAGNOSIS:   Past Medical History:  Diagnosis Date  . Cancer [facility])    Right breast  . COPD (chronic obstructive pulmonary disease) ([place])   . Diabetes mellitus without complication ([place])   . Hypertension     HOSPITAL COURSE:  85 y.o. female  presenting with altered mental status found to have acute ischemic stroke on MRI brain. Patient started on DUAP however was noted to with an episode of hematochezia.  *Acute GI bleed  Resolved Exact etiology unknown  CTA  Abdomen and pelvis reviewed and shows no source of bleed, S/p PRBCs on 5/23 for hgb of 6.4 with improvement in hgb to 8.6, treated with Protonix drip, sucralfate, held NSAIDs/steroids/ASA, Neurology clearance obtained for EGD,  GI had done EGD which showed gastric ulcer but no active bleed - suggested to hold Plavix for 2 weeks and then can resume as outpatient, Follow-up in GI clinic status post discharge  *Acute CVA brain reviewed and shows punctate 4 mm focus of diffusion abnormality involving the left parietal cortex Neurology did see patient while in house, treated with low-dose enteric-coated aspirin due to acute GI bleeding, Plavix usage per above - US carotids shows no hemodynamically significant stenosis - Echocardiogram shows no cardiac source of emboli - Continue statin with goal LDL < 70 mg/dl - PT recommending home health PT with  rolling walker status post discharge   *Altered mental status Resolved Likely due to recent acute CVA  *Type 2 diabetes mellitus without complication Controlled on current regiment  *Essential hypertension  Controlled on current regiment  *COPD without evidence of exacerbation Treated with DuoNeb PRN  DISCHARGE CONDITIONS:   stable  CONSULTS OBTAINED:  Treatment Team:  Catarina Hartshorn, MD Lin Landsman, MD Leotis Pain, MD Romaine Maciolek, Avel Peace, MD  DRUG ALLERGIES:   Allergies  Allergen Reactions  . Morphine And Related Other (See Comments)    Reaction:  Hallucinations   . Codeine Nausea And Vomiting    DISCHARGE MEDICATIONS:   Allergies as of 09/02/2018      Reactions   Morphine And Related Other (See Comments)   Reaction:  Hallucinations    Codeine Nausea And Vomiting      Medication List    STOP taking these medications   celecoxib 200 MG capsule Commonly known as:  CELEBREX   fluconazole 150 MG tablet Commonly known as:  DIFLUCAN   metFORMIN 500 MG tablet Commonly known as:  GLUCOPHAGE   ondansetron 4 MG tablet Commonly known as:  Zofran     TAKE these medications   acetaminophen 500 MG tablet Commonly known as:  TYLENOL Take 1,000 mg by mouth every 8 (eight) hours as needed for mild pain or moderate pain.   albuterol 108 (90 Base) MCG/ACT inhaler Commonly known as:  VENTOLIN HFA Inhale 2 puffs into the lungs every 4 (four) hours as needed for wheezing or shortness of breath.   aspirin 81 MG EC tablet Take 1  tablet (81 mg total) by mouth daily for 30 days. What changed:    medication strength  how much to take   atorvastatin 40 MG tablet Commonly known as:  LIPITOR Take 40 mg by mouth at bedtime.   citalopram 20 MG tablet Commonly known as:  CELEXA Take 30 mg by mouth daily.   clopidogrel 75 MG tablet Commonly known as:  PLAVIX Take 1 tablet (75 mg total) by mouth daily for 30 days. Start taking on:  September 15, 2018    gabapentin 100 MG capsule Commonly known as:  NEURONTIN Take 1 capsule (100 mg total) by mouth 3 (three) times daily for 30 days. What changed:    medication strength  how much to take  Another medication with the same name was removed. Continue taking this medication, and follow the directions you see here.   Melatonin 5 MG Tabs Take 10 mg by mouth at bedtime.   nystatin ointment Commonly known as:  MYCOSTATIN Apply 1 application topically 2 (two) times a day.   pantoprazole 40 MG tablet Commonly known as:  PROTONIX Take 1 tablet (40 mg total) by mouth 2 (two) times daily.   sucralfate 1 g tablet Commonly known as:  CARAFATE Take 1 g by mouth 4 (four) times daily. What changed:  Another medication with the same name was removed. Continue taking this medication, and follow the directions you see here.            Durable Medical Equipment  (From admission, onward)         Start     Ordered   09/02/18 1033  For home use only DME Gilford Rile  [facility])  Once    Comments:  Rolling walker with 5 inch wheels  Question:  Patient needs a walker to treat with the following condition  Answer:  CVA (cerebral vascular accident) ([place])   09/02/18 1033   08/28/18 1133  For home use only DME Walker rolling  Once    Question:  Patient needs a walker to treat with the following condition  Answer:  CVA (cerebral vascular accident) ([place])   08/28/18 1132   08/28/18 1133  For home use only DME Walker rolling  Once    Question:  Patient needs a walker to treat with the following condition  Answer:  Weakness   08/28/18 1133           DISCHARGE INSTRUCTIONS:      If you experience worsening of your admission symptoms, develop shortness of breath, life threatening emergency, suicidal or homicidal thoughts you must seek medical attention immediately by calling 911 or calling your MD immediately  if symptoms less severe.  You Must read complete instructions/literature along with all the  possible adverse reactions/side effects for all the Medicines you take and that have been prescribed to you. Take any new Medicines after you have completely understood and accept all the possible adverse reactions/side effects.   Please note  You were cared for by a hospitalist during your hospital stay. If you have any questions about your discharge medications or the care you received while you were in the hospital after you are discharged, you can call the unit and asked to speak with the hospitalist on call if the hospitalist that took care of you is not available. Once you are discharged, your primary care physician will handle any further medical issues. Please note that NO REFILLS for any discharge medications will be authorized once you are discharged, as it  is imperative that you return to your primary care physician (or establish a relationship with a primary care physician if you do not have one) for your aftercare needs so that they can reassess your need for medications and monitor your lab values.    Today   CHIEF COMPLAINT:   Chief Complaint  Patient presents with  . Altered Mental Status    HISTORY OF PRESENT ILLNESS:   85 y.o. female with a known history of COPD, type 2 diabetes mellitus, breast cancer, hypertension presented to the emergency room for confusion.  Patient also had trouble in speech.  Patient was referred to the emergency room she was worked up with MRI brain which showed no acute abnormality.  Speech appeared better in the emergency room.  No weakness or tingling or numbness in any part of the body.  Hospitalist service was consulted.  No history of fall or head injury.  Patient more awake and alert in the emergency room and responds to all verbal commands.  In the emergency room patient was worked up with CT head and MRI brain.  VITAL SIGNS:  Blood pressure (!) 156/70, pulse 62, temperature 98.6 F (37 C), temperature source Oral, resp. rate 16, height 5\' 6"   (1.676 m), weight 91 kg, SpO2 98 %.  I/O:    Intake/Output Summary (Last 24 hours) at 09/02/2018 1034 Last data filed at 09/02/2018 0900 Gross per 24 hour  Intake 360 ml  Output 1501 ml  Net -1141 ml    PHYSICAL EXAMINATION:  GENERAL:  85 y.o.-year-old patient lying in the bed with no acute distress.  EYES: Pupils equal, round, reactive to light and accommodation. No scleral icterus. Extraocular muscles intact.  HEENT: Head atraumatic, normocephalic. Oropharynx and nasopharynx clear.  NECK:  Supple, no jugular venous distention. No thyroid enlargement, no tenderness.  LUNGS: Normal breath sounds bilaterally, no wheezing, rales,rhonchi or crepitation. No use of accessory muscles of respiration.  CARDIOVASCULAR: S1, S2 normal. No murmurs, rubs, or gallops.  ABDOMEN: Soft, non-tender, non-distended. Bowel sounds present. No organomegaly or mass.  EXTREMITIES: No pedal edema, cyanosis, or clubbing.  NEUROLOGIC: Cranial nerves II through XII are intact. Muscle strength 5/5 in all extremities. Sensation intact. Gait not checked.  PSYCHIATRIC: The patient is alert and oriented x 3.  SKIN: No obvious rash, lesion, or ulcer.   DATA REVIEW:   CBC Recent Labs  Lab 09/01/18 0601  WBC 10.2  HGB 8.7*  HCT 26.4*  PLT 189    Chemistries  Recent Labs  Lab 08/31/18 0525  NA 142  K 3.5  CL 114*  CO2 21*  GLUCOSE 101*  BUN 12  CREATININE 0.81  CALCIUM 8.4*    Cardiac Enzymes No results for input(s): TROPONINI in the last 168 hours.  Microbiology Results  Results for orders placed or performed during the hospital encounter of 08/23/18  SARS Coronavirus 2 (CEPHEID - Performed in [facility] hospital lab), Hosp Order     Status: None   Collection Time: 08/24/18  8:35 AM  Result Value Ref Range Status   SARS Coronavirus 2 NEGATIVE NEGATIVE Final    Comment: (NOTE) If result is NEGATIVE SARS-CoV-2 target nucleic acids are NOT DETECTED. The SARS-CoV-2 RNA is generally detectable  in upper and lower  respiratory specimens during the acute phase of infection. The lowest  concentration of SARS-CoV-2 viral copies this assay can detect is 250  copies / mL. A negative result does not preclude SARS-CoV-2 infection  and should not be used  as the sole basis for treatment or other  patient management decisions.  A negative result may occur with  improper specimen collection / handling, submission of specimen other  than nasopharyngeal swab, presence of viral mutation(s) within the  areas targeted by this assay, and inadequate number of viral copies  (<250 copies / mL). A negative result must be combined with clinical  observations, patient history, and epidemiological information. If result is POSITIVE SARS-CoV-2 target nucleic acids are DETECTED. The SARS-CoV-2 RNA is generally detectable in upper and lower  respiratory specimens dur ing the acute phase of infection.  Positive  results are indicative of active infection with SARS-CoV-2.  Clinical  correlation with patient history and other diagnostic information is  necessary to determine patient infection status.  Positive results do  not rule out bacterial infection or co-infection with other viruses. If result is PRESUMPTIVE POSTIVE SARS-CoV-2 nucleic acids MAY BE PRESENT.   A presumptive positive result was obtained on the submitted specimen  and confirmed on repeat testing.  While 2019 novel coronavirus  (SARS-CoV-2) nucleic acids may be present in the submitted sample  additional confirmatory testing may be necessary for epidemiological  and / or clinical management purposes  to differentiate between  SARS-CoV-2 and other Sarbecovirus currently known to infect humans.  If clinically indicated additional testing with an alternate test  methodology [phone]) is advised. The SARS-CoV-2 RNA is generally  detectable in upper and lower respiratory sp ecimens during the acute  phase of infection. The expected result is  Negative. Fact Sheet for Patients:  StrictlyIdeas.no Fact Sheet for Healthcare Providers: BankingDealers.co.za This test is not yet approved or cleared by the [place] FDA and has been authorized for detection and/or diagnosis of SARS-CoV-2 by FDA under an Emergency Use Authorization (EUA).  This EUA will remain in effect (meaning this test can be used) for the duration of the COVID-19 declaration under Section 564(b)(1) of the Act, 21 U.S.C. section 360bbb-3(b)(1), unless the authorization is terminated or revoked sooner. Performed at [facility], [address], [place], [place] [address]     RADIOLOGY:  No results found.  EKG:   Orders placed or performed during the hospital encounter of 08/23/18  . EKG 12-Lead  . EKG 12-Lead      Management plans discussed with the patient, family and they are in agreement.  CODE STATUS:     Code Status Orders  (From admission, onward)         Start     Ordered   08/24/18 1444  Do not attempt resuscitation (DNR)  Continuous    Question Answer Comment  In the event of cardiac or respiratory ARREST Do not call a "code blue"   In the event of cardiac or respiratory ARREST Do not perform Intubation, CPR, defibrillation or ACLS   In the event of cardiac or respiratory ARREST Use medication by any route, position, wound care, and other measures to relive pain and suffering. May use oxygen, suction and manual treatment of airway obstruction as needed for comfort.      08/24/18 1443        Code Status History    Date Active Date Inactive Code Status Order ID Comments User Context   05/20/2018 1219 05/21/2018 1834 DNR [id number]  Dustin Flock, MD Inpatient   05/20/2018 0006 05/20/2018 1219 Full Code [id number]  Lance Coon, MD Inpatient      TOTAL TIME TAKING CARE OF THIS PATIENT: 35 minutes.    Lexander Tremblay D  Marcos Peloso M.D on 09/02/2018 at 10:34 AM  Between 7am to 6pm - Pager  - [phone]  After 6pm go to [email or web address] - password [email or web address] [place] [facility]  Office  [phone]  CC: Primary care physician; Dion Body, MD   Note: This dictation was prepared with Dragon dictation along with smaller phrase technology. Any transcriptional errors that result from this process are unintentional.

## 2018-09-02 NOTE — TOC Transition Note (Signed)
Transition of Care [facility]) - CM/SW Discharge Note   Patient Details  Name: Alexa Lowe MRN: [id number] Date of Birth: [birth year]  Transition of Care [facility]) CM/SW Contact:  Annamaria Boots, [place] Phone Number: 09/02/2018, 12:03 PM   Clinical Narrative: Patient is medically ready for discharge today back home. CSW notified patient's daughter Alexa Lowe of discharge today. Daughter will transport patient home. CSW also notified Alexa Lowe with [facility] of discharge today.      Final next level of care: [place] Barriers to Discharge: No Barriers Identified   Patient Goals and CMS Choice Patient states their goals for this hospitalization and ongoing recovery are:: return home  CMS Medicare.gov Compare Post Acute Care list provided to:: Patient Represenative (must comment)(Daughter- Alexa Lowe) Choice offered to / list presented to : Adult Children  Discharge Placement                  Name of family member notified: Daughter- Alexa Lowe  Patient and family notified of of transfer: 09/02/18  Discharge Plan and Services   Discharge Planning Services: CM Consult            DME Arranged: Gilford Rile rolling DME Agency: [employer] Date DME Agency Contacted: 08/28/18 Time DME Agency Contacted: [phone] Representative spoke with at DME Agency: [place]: PT, RN [facility] Agency: [facility] Date [place] Walk: 09/02/18 Time [place]: 1202 Representative spoke with at [place]: [place] ([place]) Interventions     Readmission Risk Interventions No flowsheet data found.

## 2018-09-03 DIAGNOSIS — Z853 Personal history of malignant neoplasm of breast: Secondary | ICD-10-CM | POA: Diagnosis not present

## 2018-09-03 DIAGNOSIS — R32 Unspecified urinary incontinence: Secondary | ICD-10-CM | POA: Diagnosis not present

## 2018-09-03 DIAGNOSIS — Z9181 History of falling: Secondary | ICD-10-CM | POA: Diagnosis not present

## 2018-09-03 DIAGNOSIS — E119 Type 2 diabetes mellitus without complications: Secondary | ICD-10-CM | POA: Diagnosis not present

## 2018-09-03 DIAGNOSIS — Z7982 Long term (current) use of aspirin: Secondary | ICD-10-CM | POA: Diagnosis not present

## 2018-09-03 DIAGNOSIS — I10 Essential (primary) hypertension: Secondary | ICD-10-CM | POA: Diagnosis not present

## 2018-09-03 DIAGNOSIS — Z794 Long term (current) use of insulin: Secondary | ICD-10-CM | POA: Diagnosis not present

## 2018-09-03 DIAGNOSIS — J449 Chronic obstructive pulmonary disease, unspecified: Secondary | ICD-10-CM | POA: Diagnosis not present

## 2018-09-03 LAB — SURGICAL PATHOLOGY

## 2018-09-04 DIAGNOSIS — Z794 Long term (current) use of insulin: Secondary | ICD-10-CM | POA: Diagnosis not present

## 2018-09-04 DIAGNOSIS — Z8673 Personal history of transient ischemic attack (TIA), and cerebral infarction without residual deficits: Secondary | ICD-10-CM | POA: Diagnosis not present

## 2018-09-04 DIAGNOSIS — Z8719 Personal history of other diseases of the digestive system: Secondary | ICD-10-CM | POA: Diagnosis not present

## 2018-09-04 DIAGNOSIS — Z8669 Personal history of other diseases of the nervous system and sense organs: Secondary | ICD-10-CM | POA: Diagnosis not present

## 2018-09-04 DIAGNOSIS — R32 Unspecified urinary incontinence: Secondary | ICD-10-CM | POA: Diagnosis not present

## 2018-09-04 DIAGNOSIS — Z853 Personal history of malignant neoplasm of breast: Secondary | ICD-10-CM | POA: Diagnosis not present

## 2018-09-04 DIAGNOSIS — J449 Chronic obstructive pulmonary disease, unspecified: Secondary | ICD-10-CM | POA: Diagnosis not present

## 2018-09-04 DIAGNOSIS — E119 Type 2 diabetes mellitus without complications: Secondary | ICD-10-CM | POA: Diagnosis not present

## 2018-09-04 DIAGNOSIS — Z7982 Long term (current) use of aspirin: Secondary | ICD-10-CM | POA: Diagnosis not present

## 2018-09-04 DIAGNOSIS — Z9181 History of falling: Secondary | ICD-10-CM | POA: Diagnosis not present

## 2018-09-04 DIAGNOSIS — I10 Essential (primary) hypertension: Secondary | ICD-10-CM | POA: Diagnosis not present

## 2018-09-07 DIAGNOSIS — I10 Essential (primary) hypertension: Secondary | ICD-10-CM | POA: Diagnosis not present

## 2018-09-07 DIAGNOSIS — Z794 Long term (current) use of insulin: Secondary | ICD-10-CM | POA: Diagnosis not present

## 2018-09-07 DIAGNOSIS — Z7982 Long term (current) use of aspirin: Secondary | ICD-10-CM | POA: Diagnosis not present

## 2018-09-07 DIAGNOSIS — Z9181 History of falling: Secondary | ICD-10-CM | POA: Diagnosis not present

## 2018-09-07 DIAGNOSIS — Z853 Personal history of malignant neoplasm of breast: Secondary | ICD-10-CM | POA: Diagnosis not present

## 2018-09-07 DIAGNOSIS — E119 Type 2 diabetes mellitus without complications: Secondary | ICD-10-CM | POA: Diagnosis not present

## 2018-09-07 DIAGNOSIS — J449 Chronic obstructive pulmonary disease, unspecified: Secondary | ICD-10-CM | POA: Diagnosis not present

## 2018-09-07 DIAGNOSIS — R32 Unspecified urinary incontinence: Secondary | ICD-10-CM | POA: Diagnosis not present

## 2018-09-08 DIAGNOSIS — Z9181 History of falling: Secondary | ICD-10-CM | POA: Diagnosis not present

## 2018-09-08 DIAGNOSIS — Z794 Long term (current) use of insulin: Secondary | ICD-10-CM | POA: Diagnosis not present

## 2018-09-08 DIAGNOSIS — E119 Type 2 diabetes mellitus without complications: Secondary | ICD-10-CM | POA: Diagnosis not present

## 2018-09-08 DIAGNOSIS — Z7982 Long term (current) use of aspirin: Secondary | ICD-10-CM | POA: Diagnosis not present

## 2018-09-08 DIAGNOSIS — J449 Chronic obstructive pulmonary disease, unspecified: Secondary | ICD-10-CM | POA: Diagnosis not present

## 2018-09-08 DIAGNOSIS — Z853 Personal history of malignant neoplasm of breast: Secondary | ICD-10-CM | POA: Diagnosis not present

## 2018-09-08 DIAGNOSIS — I10 Essential (primary) hypertension: Secondary | ICD-10-CM | POA: Diagnosis not present

## 2018-09-08 DIAGNOSIS — R32 Unspecified urinary incontinence: Secondary | ICD-10-CM | POA: Diagnosis not present

## 2018-09-09 DIAGNOSIS — Z8673 Personal history of transient ischemic attack (TIA), and cerebral infarction without residual deficits: Secondary | ICD-10-CM | POA: Diagnosis not present

## 2018-09-09 DIAGNOSIS — Z9181 History of falling: Secondary | ICD-10-CM | POA: Diagnosis not present

## 2018-09-09 DIAGNOSIS — E119 Type 2 diabetes mellitus without complications: Secondary | ICD-10-CM | POA: Diagnosis not present

## 2018-09-09 DIAGNOSIS — J449 Chronic obstructive pulmonary disease, unspecified: Secondary | ICD-10-CM | POA: Diagnosis not present

## 2018-09-09 DIAGNOSIS — Z794 Long term (current) use of insulin: Secondary | ICD-10-CM | POA: Diagnosis not present

## 2018-09-09 DIAGNOSIS — Z7982 Long term (current) use of aspirin: Secondary | ICD-10-CM | POA: Diagnosis not present

## 2018-09-09 DIAGNOSIS — G250 Essential tremor: Secondary | ICD-10-CM | POA: Diagnosis not present

## 2018-09-09 DIAGNOSIS — I10 Essential (primary) hypertension: Secondary | ICD-10-CM | POA: Diagnosis not present

## 2018-09-09 DIAGNOSIS — R32 Unspecified urinary incontinence: Secondary | ICD-10-CM | POA: Diagnosis not present

## 2018-09-09 DIAGNOSIS — Z853 Personal history of malignant neoplasm of breast: Secondary | ICD-10-CM | POA: Diagnosis not present

## 2018-09-11 DIAGNOSIS — Z853 Personal history of malignant neoplasm of breast: Secondary | ICD-10-CM | POA: Diagnosis not present

## 2018-09-11 DIAGNOSIS — Z794 Long term (current) use of insulin: Secondary | ICD-10-CM | POA: Diagnosis not present

## 2018-09-11 DIAGNOSIS — E119 Type 2 diabetes mellitus without complications: Secondary | ICD-10-CM | POA: Diagnosis not present

## 2018-09-11 DIAGNOSIS — J449 Chronic obstructive pulmonary disease, unspecified: Secondary | ICD-10-CM | POA: Diagnosis not present

## 2018-09-11 DIAGNOSIS — Z7982 Long term (current) use of aspirin: Secondary | ICD-10-CM | POA: Diagnosis not present

## 2018-09-11 DIAGNOSIS — R32 Unspecified urinary incontinence: Secondary | ICD-10-CM | POA: Diagnosis not present

## 2018-09-11 DIAGNOSIS — I10 Essential (primary) hypertension: Secondary | ICD-10-CM | POA: Diagnosis not present

## 2018-09-11 DIAGNOSIS — Z9181 History of falling: Secondary | ICD-10-CM | POA: Diagnosis not present

## 2018-09-12 DIAGNOSIS — Z794 Long term (current) use of insulin: Secondary | ICD-10-CM | POA: Diagnosis not present

## 2018-09-12 DIAGNOSIS — I10 Essential (primary) hypertension: Secondary | ICD-10-CM | POA: Diagnosis not present

## 2018-09-12 DIAGNOSIS — Z7982 Long term (current) use of aspirin: Secondary | ICD-10-CM | POA: Diagnosis not present

## 2018-09-12 DIAGNOSIS — Z853 Personal history of malignant neoplasm of breast: Secondary | ICD-10-CM | POA: Diagnosis not present

## 2018-09-12 DIAGNOSIS — R32 Unspecified urinary incontinence: Secondary | ICD-10-CM | POA: Diagnosis not present

## 2018-09-12 DIAGNOSIS — Z9181 History of falling: Secondary | ICD-10-CM | POA: Diagnosis not present

## 2018-09-12 DIAGNOSIS — E119 Type 2 diabetes mellitus without complications: Secondary | ICD-10-CM | POA: Diagnosis not present

## 2018-09-12 DIAGNOSIS — J449 Chronic obstructive pulmonary disease, unspecified: Secondary | ICD-10-CM | POA: Diagnosis not present

## 2018-09-14 DIAGNOSIS — Z794 Long term (current) use of insulin: Secondary | ICD-10-CM | POA: Diagnosis not present

## 2018-09-14 DIAGNOSIS — Z7982 Long term (current) use of aspirin: Secondary | ICD-10-CM | POA: Diagnosis not present

## 2018-09-14 DIAGNOSIS — Z853 Personal history of malignant neoplasm of breast: Secondary | ICD-10-CM | POA: Diagnosis not present

## 2018-09-14 DIAGNOSIS — J449 Chronic obstructive pulmonary disease, unspecified: Secondary | ICD-10-CM | POA: Diagnosis not present

## 2018-09-14 DIAGNOSIS — Z9181 History of falling: Secondary | ICD-10-CM | POA: Diagnosis not present

## 2018-09-14 DIAGNOSIS — I10 Essential (primary) hypertension: Secondary | ICD-10-CM | POA: Diagnosis not present

## 2018-09-14 DIAGNOSIS — E119 Type 2 diabetes mellitus without complications: Secondary | ICD-10-CM | POA: Diagnosis not present

## 2018-09-14 DIAGNOSIS — R32 Unspecified urinary incontinence: Secondary | ICD-10-CM | POA: Diagnosis not present

## 2018-09-15 DIAGNOSIS — Z853 Personal history of malignant neoplasm of breast: Secondary | ICD-10-CM | POA: Diagnosis not present

## 2018-09-15 DIAGNOSIS — Z9181 History of falling: Secondary | ICD-10-CM | POA: Diagnosis not present

## 2018-09-15 DIAGNOSIS — Z794 Long term (current) use of insulin: Secondary | ICD-10-CM | POA: Diagnosis not present

## 2018-09-15 DIAGNOSIS — R32 Unspecified urinary incontinence: Secondary | ICD-10-CM | POA: Diagnosis not present

## 2018-09-15 DIAGNOSIS — J449 Chronic obstructive pulmonary disease, unspecified: Secondary | ICD-10-CM | POA: Diagnosis not present

## 2018-09-15 DIAGNOSIS — E119 Type 2 diabetes mellitus without complications: Secondary | ICD-10-CM | POA: Diagnosis not present

## 2018-09-15 DIAGNOSIS — Z7982 Long term (current) use of aspirin: Secondary | ICD-10-CM | POA: Diagnosis not present

## 2018-09-15 DIAGNOSIS — I10 Essential (primary) hypertension: Secondary | ICD-10-CM | POA: Diagnosis not present

## 2018-09-16 DIAGNOSIS — I10 Essential (primary) hypertension: Secondary | ICD-10-CM | POA: Diagnosis not present

## 2018-09-16 DIAGNOSIS — J449 Chronic obstructive pulmonary disease, unspecified: Secondary | ICD-10-CM | POA: Diagnosis not present

## 2018-09-16 DIAGNOSIS — Z7982 Long term (current) use of aspirin: Secondary | ICD-10-CM | POA: Diagnosis not present

## 2018-09-16 DIAGNOSIS — R32 Unspecified urinary incontinence: Secondary | ICD-10-CM | POA: Diagnosis not present

## 2018-09-16 DIAGNOSIS — Z853 Personal history of malignant neoplasm of breast: Secondary | ICD-10-CM | POA: Diagnosis not present

## 2018-09-16 DIAGNOSIS — E119 Type 2 diabetes mellitus without complications: Secondary | ICD-10-CM | POA: Diagnosis not present

## 2018-09-16 DIAGNOSIS — Z9181 History of falling: Secondary | ICD-10-CM | POA: Diagnosis not present

## 2018-09-16 DIAGNOSIS — Z794 Long term (current) use of insulin: Secondary | ICD-10-CM | POA: Diagnosis not present

## 2018-09-17 ENCOUNTER — Other Ambulatory Visit: Payer: Self-pay

## 2018-09-17 ENCOUNTER — Ambulatory Visit: Payer: Medicare HMO | Admitting: Gastroenterology

## 2018-09-17 ENCOUNTER — Encounter: Payer: Self-pay | Admitting: Gastroenterology

## 2018-09-17 ENCOUNTER — Ambulatory Visit (INDEPENDENT_AMBULATORY_CARE_PROVIDER_SITE_OTHER): Payer: Medicare HMO | Admitting: Gastroenterology

## 2018-09-17 VITALS — BP 113/63 | HR 78 | Temp 98.0°F | Resp 17 | Ht 62.0 in | Wt 195.6 lb

## 2018-09-17 DIAGNOSIS — Z853 Personal history of malignant neoplasm of breast: Secondary | ICD-10-CM | POA: Diagnosis not present

## 2018-09-17 DIAGNOSIS — R32 Unspecified urinary incontinence: Secondary | ICD-10-CM | POA: Diagnosis not present

## 2018-09-17 DIAGNOSIS — Z794 Long term (current) use of insulin: Secondary | ICD-10-CM | POA: Diagnosis not present

## 2018-09-17 DIAGNOSIS — D500 Iron deficiency anemia secondary to blood loss (chronic): Secondary | ICD-10-CM

## 2018-09-17 DIAGNOSIS — Z9181 History of falling: Secondary | ICD-10-CM | POA: Diagnosis not present

## 2018-09-17 DIAGNOSIS — E119 Type 2 diabetes mellitus without complications: Secondary | ICD-10-CM | POA: Diagnosis not present

## 2018-09-17 DIAGNOSIS — I10 Essential (primary) hypertension: Secondary | ICD-10-CM | POA: Diagnosis not present

## 2018-09-17 DIAGNOSIS — K279 Peptic ulcer, site unspecified, unspecified as acute or chronic, without hemorrhage or perforation: Secondary | ICD-10-CM | POA: Diagnosis not present

## 2018-09-17 DIAGNOSIS — J449 Chronic obstructive pulmonary disease, unspecified: Secondary | ICD-10-CM | POA: Diagnosis not present

## 2018-09-17 DIAGNOSIS — Z7982 Long term (current) use of aspirin: Secondary | ICD-10-CM | POA: Diagnosis not present

## 2018-09-17 NOTE — Progress Notes (Signed)
Alexa Darby, MD [address]  [place]  [place], [place] [address]  Main: [phone]  Fax: [phone]    Gastroenterology Consultation  Referring Provider:     Dion Body, MD Primary Care Physician:  Dion Body, MD Primary Gastroenterologist:  Dr. Cephas Lowe Reason for Consultation:     PUD        HPI:   LATISE DILLEY is a 85 y.o. female referred by Dr. Dion Body, MD  for consultation & management of PUD  I initially met Ms. Boyington when she was admitted to [facility] secondary to altered mental status and found to have acute stroke, who developed significant GI bleed and she was started on aspirin and Plavix.  These were stopped, she underwent CT angios abdomen and pelvis which did not reveal active bleeding source, subsequently she underwent upper endoscopy which revealed persistent clean-based gastric ulcer.  Patient was originally admitted to [facility] in 2/20 due to anemia and melena and was found to have clean-based gastric antral ulcer.  This was secondary to high-dose aspirin and Celebrex.  Stayed on Celebrex.  She also has severe iron deficiency and received parenteral iron.  She was discharged home on Protonix 40 mg twice daily.  Neurology was consulted and recommended to stay off aspirin and Plavix.  Interval summary Patient is accompanied by her daughter today.  Patient is recovering from stroke slowly.  She does have some memory issues, tremors in her hand, difficulty identifying colors, weakness.  Her appetite is okay.  She had a follow-up with her neurologist, Dr. Manuella Ghazi who recommended not to restart aspirin and Plavix.  Her geriatrician also recommended not to restart aspirin and Plavix.  She is taking Protonix 40 mg twice daily.  She denies abdominal pain, nausea or vomiting.  Reports her stools are brown in color.  She does have postprandial urgency.  NSAIDs: None  Antiplts/Anticoagulants/Anti thrombotics: None  GI Procedures:  EGD  05/20/2018 - Normal esophagus. - Non-bleeding gastric ulcer with no stigmata of bleeding. - Normal examined duodenum. - Biopsies were taken with a cold forceps for Helicobacter pylori testing.  EGD 08/31/2018 - Normal duodenal bulb and second portion of the duodenum. - Non-bleeding gastric ulcer with a clean ulcer base (Forrest Class III). Biopsied. - Normal gastroesophageal junction and esophagus. DIAGNOSIS:  A. STOMCAH, RANDOM GASTRIC; BIOPSY:  - BENIGN GASTRIC MUCOSA WITH NO SIGNIFICANT HISTOPATHOLOGICAL  FINDINGS.  - ACTIVITY, INTESTINAL METAPLASIA AND DYSPLASIA ARE NOT PRESENT.  - MICROORGANISMS ARE NOT IDENTIFIED ON THE ROUTINE STAIN.    Past Medical History:  Diagnosis Date   Cancer [facility])    Right breast   COPD (chronic obstructive pulmonary disease) ([place])    Diabetes mellitus without complication ([place])    Hypertension     Past Surgical History:  Procedure Laterality Date   ABDOMINAL SURGERY     APPENDECTOMY     BACK SURGERY     BREAST SURGERY     CHOLECYSTECTOMY     ESOPHAGOGASTRODUODENOSCOPY N/A 08/31/2018   Procedure: ESOPHAGOGASTRODUODENOSCOPY (EGD);  Surgeon: Lin Landsman, MD;  Location: [facility] ENDOSCOPY;  Service: Gastroenterology;  Laterality: N/A;   ESOPHAGOGASTRODUODENOSCOPY (EGD) WITH PROPOFOL N/A 05/20/2018   Procedure: ESOPHAGOGASTRODUODENOSCOPY (EGD) WITH PROPOFOL;  Surgeon: Toledo, Benay Pike, MD;  Location: [facility] ENDOSCOPY;  Service: Gastroenterology;  Laterality: N/A;    Current Outpatient Medications:    acetaminophen (TYLENOL) 500 MG tablet, Take 1,000 mg by mouth every 8 (eight) hours as needed for mild pain or moderate pain.,  Disp: , Rfl:    albuterol (PROVENTIL HFA;VENTOLIN HFA) 108 (90 Base) MCG/ACT inhaler, Inhale 2 puffs into the lungs every 4 (four) hours as needed for wheezing or shortness of breath., Disp: , Rfl:    atorvastatin (LIPITOR) 40 MG tablet, Take 40 mg by mouth at bedtime. , Disp: , Rfl:    Cholecalciferol  (VITAMIN D-1000 MAX ST) 25 MCG (1000 UT) tablet, Take by mouth., Disp: , Rfl:    citalopram (CELEXA) 20 MG tablet, Take 30 mg by mouth daily. , Disp: , Rfl:    gabapentin (NEURONTIN) 100 MG capsule, Take 1 capsule (100 mg total) by mouth 3 (three) times daily for 30 days., Disp: 30 capsule, Rfl: 0   Melatonin 5 MG TABS, Take 10 mg by mouth at bedtime., Disp: , Rfl:    nystatin ointment (MYCOSTATIN), Apply 1 application topically 2 (two) times a day., Disp: , Rfl:    pantoprazole (PROTONIX) 40 MG tablet, Take 1 tablet (40 mg total) by mouth 2 (two) times daily., Disp: 30 tablet, Rfl: 1   sucralfate (CARAFATE) 1 g tablet, Take 1 g by mouth 4 (four) times daily., Disp: , Rfl:    vitamin B-12 (CYANOCOBALAMIN) 1000 MCG tablet, Take by mouth., Disp: , Rfl:    aspirin 81 MG EC tablet, Take 1 tablet (81 mg total) by mouth daily for 30 days. (Patient not taking: Reported on 09/17/2018), Disp: 30 tablet, Rfl: 0   clopidogrel (PLAVIX) 75 MG tablet, Take 1 tablet (75 mg total) by mouth daily for 30 days. (Patient not taking: Reported on 09/17/2018), Disp: 30 tablet, Rfl: 0  No family history on file.   Social History   Tobacco Use   Smoking status: Former Smoker   Smokeless tobacco: Never Used  Substance Use Topics   Alcohol use: No   Drug use: No    Allergies as of 09/17/2018 - Review Complete 09/17/2018  Allergen Reaction Noted   Morphine and related Other (See Comments) 10/28/2014   Ace inhibitors Other (See Comments) 12/24/2013   Doxycycline Other (See Comments) 08/24/2015   Erythromycin ethylsuccinate Other (See Comments) 12/24/2013   Codeine Nausea And Vomiting 10/28/2014    Review of Systems:    All systems reviewed and negative except where noted in HPI.   Physical Exam:  BP 113/63 (BP Location: Left Arm, Patient Position: Sitting, Cuff Size: Large)    Pulse 78    Temp 98 F (36.7 C)    Resp 17    Ht 5' 2" (1.575 m)    Wt 195 lb 9.6 oz (88.7 kg)    BMI 35.78 kg/m    No LMP recorded. Patient has had a hysterectomy.  General:   Alert,  Well-developed, well-nourished, pleasant and cooperative in NAD Head:  Normocephalic and atraumatic. Eyes:  Sclera clear, no icterus.   Conjunctiva pink. Ears:  Normal auditory acuity. Nose:  No deformity, discharge, or lesions. Mouth:  No deformity or lesions,oropharynx pink & moist. Neck:  Supple; no masses or thyromegaly. Lungs:  Respirations even and unlabored.  Clear throughout to auscultation.   No wheezes, crackles, or rhonchi. No acute distress. Heart:  Regular rate and rhythm; no murmurs, clicks, rubs, or gallops. Abdomen:  Normal bowel sounds. Soft, non-tender and non-distended without masses, hepatosplenomegaly or hernias noted.  No guarding or rebound tenderness.   Rectal: Not performed Msk:  Symmetrical without gross deformities. Good, equal movement & strength bilaterally. Pulses:  Normal pulses noted. Extremities:  No clubbing or edema.  No cyanosis. Neurologic:  Alert and oriented x3; Skin:  Intact without significant lesions or rashes. No jaundice. Psych:  Alert and cooperative. Normal mood and affect.  Imaging Studies: Reviewed  Assessment and Plan:   [name] is a 85 y.o. female with history of diabetes, recent history of stroke, known gastric ulcer, thought to secondary to NSAIDs, negative for H. pylori, severe iron deficiency anemia here for hospital follow-up.  Patient is recovering progressively from recent stroke.  She is not reporting any symptoms or signs to suggest active GI bleed  Recommend to check CBC, iron studies Continue Protonix 40 mg twice daily long-term Okay to continue sucralfate as needed Recommend EGD in next 2 months to confirm healing of the ulcer Okay to start aspirin 81 mg daily from GI standpoint, advised patient's daughter to discuss about the need for antiplatelet therapy with her neurologist and geriatrician   Follow up in 2 months   Alexa Darby, MD

## 2018-09-18 DIAGNOSIS — R32 Unspecified urinary incontinence: Secondary | ICD-10-CM | POA: Diagnosis not present

## 2018-09-18 DIAGNOSIS — Z7982 Long term (current) use of aspirin: Secondary | ICD-10-CM | POA: Diagnosis not present

## 2018-09-18 DIAGNOSIS — Z794 Long term (current) use of insulin: Secondary | ICD-10-CM | POA: Diagnosis not present

## 2018-09-18 DIAGNOSIS — J449 Chronic obstructive pulmonary disease, unspecified: Secondary | ICD-10-CM | POA: Diagnosis not present

## 2018-09-18 DIAGNOSIS — Z853 Personal history of malignant neoplasm of breast: Secondary | ICD-10-CM | POA: Diagnosis not present

## 2018-09-18 DIAGNOSIS — Z9181 History of falling: Secondary | ICD-10-CM | POA: Diagnosis not present

## 2018-09-18 DIAGNOSIS — I10 Essential (primary) hypertension: Secondary | ICD-10-CM | POA: Diagnosis not present

## 2018-09-18 DIAGNOSIS — E119 Type 2 diabetes mellitus without complications: Secondary | ICD-10-CM | POA: Diagnosis not present

## 2018-09-20 DIAGNOSIS — M545 Low back pain: Secondary | ICD-10-CM | POA: Diagnosis not present

## 2018-09-20 DIAGNOSIS — M150 Primary generalized (osteo)arthritis: Secondary | ICD-10-CM | POA: Diagnosis not present

## 2018-09-21 DIAGNOSIS — J449 Chronic obstructive pulmonary disease, unspecified: Secondary | ICD-10-CM | POA: Diagnosis not present

## 2018-09-21 DIAGNOSIS — Z7982 Long term (current) use of aspirin: Secondary | ICD-10-CM | POA: Diagnosis not present

## 2018-09-21 DIAGNOSIS — Z8673 Personal history of transient ischemic attack (TIA), and cerebral infarction without residual deficits: Secondary | ICD-10-CM | POA: Diagnosis not present

## 2018-09-21 DIAGNOSIS — M7501 Adhesive capsulitis of right shoulder: Secondary | ICD-10-CM | POA: Diagnosis not present

## 2018-09-21 DIAGNOSIS — R32 Unspecified urinary incontinence: Secondary | ICD-10-CM | POA: Diagnosis not present

## 2018-09-21 DIAGNOSIS — Z9181 History of falling: Secondary | ICD-10-CM | POA: Diagnosis not present

## 2018-09-21 DIAGNOSIS — Z853 Personal history of malignant neoplasm of breast: Secondary | ICD-10-CM | POA: Diagnosis not present

## 2018-09-21 DIAGNOSIS — I10 Essential (primary) hypertension: Secondary | ICD-10-CM | POA: Diagnosis not present

## 2018-09-21 DIAGNOSIS — E119 Type 2 diabetes mellitus without complications: Secondary | ICD-10-CM | POA: Diagnosis not present

## 2018-09-21 DIAGNOSIS — Z8711 Personal history of peptic ulcer disease: Secondary | ICD-10-CM | POA: Diagnosis not present

## 2018-09-21 DIAGNOSIS — Z794 Long term (current) use of insulin: Secondary | ICD-10-CM | POA: Diagnosis not present

## 2018-09-21 LAB — CBC
Hematocrit: 35.4 % (ref 34.0–46.6)
Hemoglobin: 11.3 g/dL (ref 11.1–15.9)
MCH: 30.5 pg (ref 26.6–33.0)
MCHC: 31.9 g/dL (ref 31.5–35.7)
MCV: 95 fL (ref 79–97)
Platelets: 235 10*3/uL (ref 150–450)
RBC: 3.71 x10E6/uL — ABNORMAL LOW (ref 3.77–5.28)
RDW: 15.7 % — ABNORMAL HIGH (ref 11.7–15.4)
WBC: 7.7 10*3/uL (ref 3.4–10.8)

## 2018-09-21 LAB — IRON AND TIBC
Iron Saturation: 27 % (ref 15–55)
Iron: 64 ug/dL (ref 27–139)
Total Iron Binding Capacity: 238 ug/dL — ABNORMAL LOW (ref 250–450)
UIBC: 174 ug/dL (ref 118–369)

## 2018-09-21 LAB — COPPER, SERUM: Copper: 85 ug/dL (ref 72–166)

## 2018-09-21 LAB — FERRITIN: Ferritin: 530 ng/mL — ABNORMAL HIGH (ref 15–150)

## 2018-09-22 DIAGNOSIS — Z7982 Long term (current) use of aspirin: Secondary | ICD-10-CM | POA: Diagnosis not present

## 2018-09-22 DIAGNOSIS — E119 Type 2 diabetes mellitus without complications: Secondary | ICD-10-CM | POA: Diagnosis not present

## 2018-09-22 DIAGNOSIS — Z853 Personal history of malignant neoplasm of breast: Secondary | ICD-10-CM | POA: Diagnosis not present

## 2018-09-22 DIAGNOSIS — Z9181 History of falling: Secondary | ICD-10-CM | POA: Diagnosis not present

## 2018-09-22 DIAGNOSIS — I10 Essential (primary) hypertension: Secondary | ICD-10-CM | POA: Diagnosis not present

## 2018-09-22 DIAGNOSIS — J449 Chronic obstructive pulmonary disease, unspecified: Secondary | ICD-10-CM | POA: Diagnosis not present

## 2018-09-22 DIAGNOSIS — R32 Unspecified urinary incontinence: Secondary | ICD-10-CM | POA: Diagnosis not present

## 2018-09-22 DIAGNOSIS — Z794 Long term (current) use of insulin: Secondary | ICD-10-CM | POA: Diagnosis not present

## 2018-09-23 DIAGNOSIS — Z853 Personal history of malignant neoplasm of breast: Secondary | ICD-10-CM | POA: Diagnosis not present

## 2018-09-23 DIAGNOSIS — Z7982 Long term (current) use of aspirin: Secondary | ICD-10-CM | POA: Diagnosis not present

## 2018-09-23 DIAGNOSIS — J449 Chronic obstructive pulmonary disease, unspecified: Secondary | ICD-10-CM | POA: Diagnosis not present

## 2018-09-23 DIAGNOSIS — E119 Type 2 diabetes mellitus without complications: Secondary | ICD-10-CM | POA: Diagnosis not present

## 2018-09-23 DIAGNOSIS — R32 Unspecified urinary incontinence: Secondary | ICD-10-CM | POA: Diagnosis not present

## 2018-09-23 DIAGNOSIS — Z794 Long term (current) use of insulin: Secondary | ICD-10-CM | POA: Diagnosis not present

## 2018-09-23 DIAGNOSIS — Z9181 History of falling: Secondary | ICD-10-CM | POA: Diagnosis not present

## 2018-09-23 DIAGNOSIS — I10 Essential (primary) hypertension: Secondary | ICD-10-CM | POA: Diagnosis not present

## 2018-09-24 DIAGNOSIS — E669 Obesity, unspecified: Secondary | ICD-10-CM | POA: Diagnosis not present

## 2018-09-24 DIAGNOSIS — E1169 Type 2 diabetes mellitus with other specified complication: Secondary | ICD-10-CM | POA: Diagnosis not present

## 2018-09-24 DIAGNOSIS — M7501 Adhesive capsulitis of right shoulder: Secondary | ICD-10-CM | POA: Diagnosis not present

## 2018-09-24 DIAGNOSIS — E785 Hyperlipidemia, unspecified: Secondary | ICD-10-CM | POA: Diagnosis not present

## 2018-09-24 DIAGNOSIS — G8929 Other chronic pain: Secondary | ICD-10-CM | POA: Diagnosis not present

## 2018-09-24 DIAGNOSIS — M25511 Pain in right shoulder: Secondary | ICD-10-CM | POA: Diagnosis not present

## 2018-09-24 DIAGNOSIS — M19011 Primary osteoarthritis, right shoulder: Secondary | ICD-10-CM | POA: Diagnosis not present

## 2018-09-25 ENCOUNTER — Other Ambulatory Visit: Payer: Self-pay | Admitting: Orthopedic Surgery

## 2018-09-25 ENCOUNTER — Other Ambulatory Visit (HOSPITAL_COMMUNITY): Payer: Self-pay | Admitting: Orthopedic Surgery

## 2018-09-25 DIAGNOSIS — R32 Unspecified urinary incontinence: Secondary | ICD-10-CM | POA: Diagnosis not present

## 2018-09-25 DIAGNOSIS — M7501 Adhesive capsulitis of right shoulder: Secondary | ICD-10-CM

## 2018-09-25 DIAGNOSIS — M19011 Primary osteoarthritis, right shoulder: Secondary | ICD-10-CM

## 2018-09-25 DIAGNOSIS — G8929 Other chronic pain: Secondary | ICD-10-CM

## 2018-09-25 DIAGNOSIS — Z794 Long term (current) use of insulin: Secondary | ICD-10-CM | POA: Diagnosis not present

## 2018-09-25 DIAGNOSIS — Z853 Personal history of malignant neoplasm of breast: Secondary | ICD-10-CM | POA: Diagnosis not present

## 2018-09-25 DIAGNOSIS — J449 Chronic obstructive pulmonary disease, unspecified: Secondary | ICD-10-CM | POA: Diagnosis not present

## 2018-09-25 DIAGNOSIS — I10 Essential (primary) hypertension: Secondary | ICD-10-CM | POA: Diagnosis not present

## 2018-09-25 DIAGNOSIS — E119 Type 2 diabetes mellitus without complications: Secondary | ICD-10-CM | POA: Diagnosis not present

## 2018-09-25 DIAGNOSIS — M25511 Pain in right shoulder: Secondary | ICD-10-CM

## 2018-09-25 DIAGNOSIS — Z7982 Long term (current) use of aspirin: Secondary | ICD-10-CM | POA: Diagnosis not present

## 2018-09-25 DIAGNOSIS — Z9181 History of falling: Secondary | ICD-10-CM | POA: Diagnosis not present

## 2018-09-28 DIAGNOSIS — Z9181 History of falling: Secondary | ICD-10-CM | POA: Diagnosis not present

## 2018-09-28 DIAGNOSIS — Z853 Personal history of malignant neoplasm of breast: Secondary | ICD-10-CM | POA: Diagnosis not present

## 2018-09-28 DIAGNOSIS — I10 Essential (primary) hypertension: Secondary | ICD-10-CM | POA: Diagnosis not present

## 2018-09-28 DIAGNOSIS — Z794 Long term (current) use of insulin: Secondary | ICD-10-CM | POA: Diagnosis not present

## 2018-09-28 DIAGNOSIS — J449 Chronic obstructive pulmonary disease, unspecified: Secondary | ICD-10-CM | POA: Diagnosis not present

## 2018-09-28 DIAGNOSIS — R32 Unspecified urinary incontinence: Secondary | ICD-10-CM | POA: Diagnosis not present

## 2018-09-28 DIAGNOSIS — Z7982 Long term (current) use of aspirin: Secondary | ICD-10-CM | POA: Diagnosis not present

## 2018-09-28 DIAGNOSIS — E119 Type 2 diabetes mellitus without complications: Secondary | ICD-10-CM | POA: Diagnosis not present

## 2018-09-29 DIAGNOSIS — J449 Chronic obstructive pulmonary disease, unspecified: Secondary | ICD-10-CM | POA: Diagnosis not present

## 2018-09-29 DIAGNOSIS — E119 Type 2 diabetes mellitus without complications: Secondary | ICD-10-CM | POA: Diagnosis not present

## 2018-09-29 DIAGNOSIS — Z853 Personal history of malignant neoplasm of breast: Secondary | ICD-10-CM | POA: Diagnosis not present

## 2018-09-29 DIAGNOSIS — Z7982 Long term (current) use of aspirin: Secondary | ICD-10-CM | POA: Diagnosis not present

## 2018-09-29 DIAGNOSIS — I10 Essential (primary) hypertension: Secondary | ICD-10-CM | POA: Diagnosis not present

## 2018-09-29 DIAGNOSIS — Z794 Long term (current) use of insulin: Secondary | ICD-10-CM | POA: Diagnosis not present

## 2018-09-29 DIAGNOSIS — R32 Unspecified urinary incontinence: Secondary | ICD-10-CM | POA: Diagnosis not present

## 2018-09-29 DIAGNOSIS — Z9181 History of falling: Secondary | ICD-10-CM | POA: Diagnosis not present

## 2018-09-30 DIAGNOSIS — Z853 Personal history of malignant neoplasm of breast: Secondary | ICD-10-CM | POA: Diagnosis not present

## 2018-09-30 DIAGNOSIS — Z7982 Long term (current) use of aspirin: Secondary | ICD-10-CM | POA: Diagnosis not present

## 2018-09-30 DIAGNOSIS — Z9181 History of falling: Secondary | ICD-10-CM | POA: Diagnosis not present

## 2018-09-30 DIAGNOSIS — E119 Type 2 diabetes mellitus without complications: Secondary | ICD-10-CM | POA: Diagnosis not present

## 2018-09-30 DIAGNOSIS — Z794 Long term (current) use of insulin: Secondary | ICD-10-CM | POA: Diagnosis not present

## 2018-09-30 DIAGNOSIS — I10 Essential (primary) hypertension: Secondary | ICD-10-CM | POA: Diagnosis not present

## 2018-09-30 DIAGNOSIS — R32 Unspecified urinary incontinence: Secondary | ICD-10-CM | POA: Diagnosis not present

## 2018-09-30 DIAGNOSIS — J449 Chronic obstructive pulmonary disease, unspecified: Secondary | ICD-10-CM | POA: Diagnosis not present

## 2018-10-02 DIAGNOSIS — Z794 Long term (current) use of insulin: Secondary | ICD-10-CM | POA: Diagnosis not present

## 2018-10-02 DIAGNOSIS — Z9181 History of falling: Secondary | ICD-10-CM | POA: Diagnosis not present

## 2018-10-02 DIAGNOSIS — J449 Chronic obstructive pulmonary disease, unspecified: Secondary | ICD-10-CM | POA: Diagnosis not present

## 2018-10-02 DIAGNOSIS — Z853 Personal history of malignant neoplasm of breast: Secondary | ICD-10-CM | POA: Diagnosis not present

## 2018-10-02 DIAGNOSIS — R32 Unspecified urinary incontinence: Secondary | ICD-10-CM | POA: Diagnosis not present

## 2018-10-02 DIAGNOSIS — I10 Essential (primary) hypertension: Secondary | ICD-10-CM | POA: Diagnosis not present

## 2018-10-02 DIAGNOSIS — E119 Type 2 diabetes mellitus without complications: Secondary | ICD-10-CM | POA: Diagnosis not present

## 2018-10-02 DIAGNOSIS — Z7982 Long term (current) use of aspirin: Secondary | ICD-10-CM | POA: Diagnosis not present

## 2018-10-05 DIAGNOSIS — Z794 Long term (current) use of insulin: Secondary | ICD-10-CM | POA: Diagnosis not present

## 2018-10-05 DIAGNOSIS — J449 Chronic obstructive pulmonary disease, unspecified: Secondary | ICD-10-CM | POA: Diagnosis not present

## 2018-10-05 DIAGNOSIS — Z853 Personal history of malignant neoplasm of breast: Secondary | ICD-10-CM | POA: Diagnosis not present

## 2018-10-05 DIAGNOSIS — I10 Essential (primary) hypertension: Secondary | ICD-10-CM | POA: Diagnosis not present

## 2018-10-05 DIAGNOSIS — Z7982 Long term (current) use of aspirin: Secondary | ICD-10-CM | POA: Diagnosis not present

## 2018-10-05 DIAGNOSIS — Z9181 History of falling: Secondary | ICD-10-CM | POA: Diagnosis not present

## 2018-10-05 DIAGNOSIS — R32 Unspecified urinary incontinence: Secondary | ICD-10-CM | POA: Diagnosis not present

## 2018-10-05 DIAGNOSIS — E119 Type 2 diabetes mellitus without complications: Secondary | ICD-10-CM | POA: Diagnosis not present

## 2018-10-09 DIAGNOSIS — R32 Unspecified urinary incontinence: Secondary | ICD-10-CM | POA: Diagnosis not present

## 2018-10-09 DIAGNOSIS — E119 Type 2 diabetes mellitus without complications: Secondary | ICD-10-CM | POA: Diagnosis not present

## 2018-10-09 DIAGNOSIS — I10 Essential (primary) hypertension: Secondary | ICD-10-CM | POA: Diagnosis not present

## 2018-10-09 DIAGNOSIS — Z7982 Long term (current) use of aspirin: Secondary | ICD-10-CM | POA: Diagnosis not present

## 2018-10-09 DIAGNOSIS — J449 Chronic obstructive pulmonary disease, unspecified: Secondary | ICD-10-CM | POA: Diagnosis not present

## 2018-10-09 DIAGNOSIS — Z9181 History of falling: Secondary | ICD-10-CM | POA: Diagnosis not present

## 2018-10-09 DIAGNOSIS — Z794 Long term (current) use of insulin: Secondary | ICD-10-CM | POA: Diagnosis not present

## 2018-10-09 DIAGNOSIS — Z853 Personal history of malignant neoplasm of breast: Secondary | ICD-10-CM | POA: Diagnosis not present

## 2018-10-12 DIAGNOSIS — R32 Unspecified urinary incontinence: Secondary | ICD-10-CM | POA: Diagnosis not present

## 2018-10-12 DIAGNOSIS — Z9181 History of falling: Secondary | ICD-10-CM | POA: Diagnosis not present

## 2018-10-12 DIAGNOSIS — Z794 Long term (current) use of insulin: Secondary | ICD-10-CM | POA: Diagnosis not present

## 2018-10-12 DIAGNOSIS — J449 Chronic obstructive pulmonary disease, unspecified: Secondary | ICD-10-CM | POA: Diagnosis not present

## 2018-10-12 DIAGNOSIS — I10 Essential (primary) hypertension: Secondary | ICD-10-CM | POA: Diagnosis not present

## 2018-10-12 DIAGNOSIS — Z7982 Long term (current) use of aspirin: Secondary | ICD-10-CM | POA: Diagnosis not present

## 2018-10-12 DIAGNOSIS — Z853 Personal history of malignant neoplasm of breast: Secondary | ICD-10-CM | POA: Diagnosis not present

## 2018-10-12 DIAGNOSIS — E119 Type 2 diabetes mellitus without complications: Secondary | ICD-10-CM | POA: Diagnosis not present

## 2018-10-15 ENCOUNTER — Other Ambulatory Visit: Payer: Self-pay

## 2018-10-15 ENCOUNTER — Ambulatory Visit
Admission: RE | Admit: 2018-10-15 | Discharge: 2018-10-15 | Disposition: A | Discharge status: Home / Self Care | Payer: Medicare HMO | Source: Ambulatory Visit | Attending: Orthopedic Surgery | Admitting: Orthopedic Surgery

## 2018-10-15 DIAGNOSIS — M75111 Incomplete rotator cuff tear or rupture of right shoulder, not specified as traumatic: Secondary | ICD-10-CM | POA: Diagnosis not present

## 2018-10-15 DIAGNOSIS — M75121 Complete rotator cuff tear or rupture of right shoulder, not specified as traumatic: Secondary | ICD-10-CM | POA: Diagnosis not present

## 2018-10-15 DIAGNOSIS — M7501 Adhesive capsulitis of right shoulder: Secondary | ICD-10-CM | POA: Diagnosis not present

## 2018-10-15 DIAGNOSIS — M25511 Pain in right shoulder: Secondary | ICD-10-CM | POA: Diagnosis not present

## 2018-10-15 DIAGNOSIS — G8929 Other chronic pain: Secondary | ICD-10-CM | POA: Diagnosis not present

## 2018-10-15 DIAGNOSIS — M19011 Primary osteoarthritis, right shoulder: Secondary | ICD-10-CM | POA: Insufficient documentation

## 2018-10-20 DIAGNOSIS — J449 Chronic obstructive pulmonary disease, unspecified: Secondary | ICD-10-CM | POA: Diagnosis not present

## 2018-10-20 DIAGNOSIS — Z794 Long term (current) use of insulin: Secondary | ICD-10-CM | POA: Diagnosis not present

## 2018-10-20 DIAGNOSIS — Z9181 History of falling: Secondary | ICD-10-CM | POA: Diagnosis not present

## 2018-10-20 DIAGNOSIS — I10 Essential (primary) hypertension: Secondary | ICD-10-CM | POA: Diagnosis not present

## 2018-10-20 DIAGNOSIS — Z7982 Long term (current) use of aspirin: Secondary | ICD-10-CM | POA: Diagnosis not present

## 2018-10-20 DIAGNOSIS — M150 Primary generalized (osteo)arthritis: Secondary | ICD-10-CM | POA: Diagnosis not present

## 2018-10-20 DIAGNOSIS — R32 Unspecified urinary incontinence: Secondary | ICD-10-CM | POA: Diagnosis not present

## 2018-10-20 DIAGNOSIS — E119 Type 2 diabetes mellitus without complications: Secondary | ICD-10-CM | POA: Diagnosis not present

## 2018-10-20 DIAGNOSIS — Z853 Personal history of malignant neoplasm of breast: Secondary | ICD-10-CM | POA: Diagnosis not present

## 2018-10-20 DIAGNOSIS — M545 Low back pain: Secondary | ICD-10-CM | POA: Diagnosis not present

## 2018-11-04 DIAGNOSIS — M25511 Pain in right shoulder: Secondary | ICD-10-CM | POA: Diagnosis not present

## 2018-11-04 DIAGNOSIS — G8929 Other chronic pain: Secondary | ICD-10-CM | POA: Diagnosis not present

## 2018-11-20 DIAGNOSIS — M545 Low back pain: Secondary | ICD-10-CM | POA: Diagnosis not present

## 2018-11-20 DIAGNOSIS — M150 Primary generalized (osteo)arthritis: Secondary | ICD-10-CM | POA: Diagnosis not present

## 2018-11-30 DIAGNOSIS — Z9889 Other specified postprocedural states: Secondary | ICD-10-CM | POA: Diagnosis not present

## 2018-11-30 DIAGNOSIS — G459 Transient cerebral ischemic attack, unspecified: Secondary | ICD-10-CM | POA: Diagnosis not present

## 2018-11-30 DIAGNOSIS — Z8673 Personal history of transient ischemic attack (TIA), and cerebral infarction without residual deficits: Secondary | ICD-10-CM | POA: Diagnosis not present

## 2018-11-30 DIAGNOSIS — I10 Essential (primary) hypertension: Secondary | ICD-10-CM | POA: Diagnosis not present

## 2018-11-30 DIAGNOSIS — E7800 Pure hypercholesterolemia, unspecified: Secondary | ICD-10-CM | POA: Diagnosis not present

## 2018-11-30 DIAGNOSIS — J449 Chronic obstructive pulmonary disease, unspecified: Secondary | ICD-10-CM | POA: Diagnosis not present

## 2018-11-30 DIAGNOSIS — N183 Chronic kidney disease, stage 3 (moderate): Secondary | ICD-10-CM | POA: Diagnosis not present

## 2018-11-30 DIAGNOSIS — E785 Hyperlipidemia, unspecified: Secondary | ICD-10-CM | POA: Diagnosis not present

## 2018-11-30 DIAGNOSIS — E1169 Type 2 diabetes mellitus with other specified complication: Secondary | ICD-10-CM | POA: Diagnosis not present

## 2018-12-03 ENCOUNTER — Encounter: Payer: Self-pay | Admitting: Gastroenterology

## 2018-12-03 ENCOUNTER — Other Ambulatory Visit: Payer: Self-pay

## 2018-12-03 ENCOUNTER — Ambulatory Visit (INDEPENDENT_AMBULATORY_CARE_PROVIDER_SITE_OTHER): Payer: Medicare HMO | Admitting: Gastroenterology

## 2018-12-03 VITALS — BP 113/69 | HR 68 | Temp 98.8°F | Resp 16 | Ht 62.0 in | Wt 204.2 lb

## 2018-12-03 DIAGNOSIS — K259 Gastric ulcer, unspecified as acute or chronic, without hemorrhage or perforation: Secondary | ICD-10-CM

## 2018-12-03 DIAGNOSIS — K254 Chronic or unspecified gastric ulcer with hemorrhage: Secondary | ICD-10-CM

## 2018-12-03 MED ORDER — PANTOPRAZOLE SODIUM 40 MG PO TBEC: 40.0000 mg | DELAYED_RELEASE_TABLET | Freq: Two times a day (BID) | ORAL | 0 refills | Status: DC

## 2018-12-03 NOTE — Progress Notes (Signed)
Cephas Darby, MD [address]  [place]  [place], [place] [address]  Main: [phone]  Fax: [phone]    Gastroenterology Consultation  Referring Provider:     Dion Body, MD Primary Care Physician:  Dion Body, MD Primary Gastroenterologist:  Dr. Cephas Darby Reason for Consultation:     PUD        HPI:   Alexa Lowe is a 85 y.o. female referred by Dr. Dion Body, MD  for consultation & management of PUD  I initially met Alexa Lowe when she was admitted to [facility] secondary to altered mental status and found to have acute stroke, who developed significant GI bleed and she was started on aspirin and Plavix.  These were stopped, she underwent CT angios abdomen and pelvis which did not reveal active bleeding source, subsequently she underwent upper endoscopy which revealed persistent clean-based gastric ulcer.  Patient was originally admitted to [facility] in 2/20 due to anemia and melena and was found to have clean-based gastric antral ulcer.  This was secondary to high-dose aspirin and Celebrex.  Stayed on Celebrex.  She also has severe iron deficiency and received parenteral iron.  She was discharged home on Protonix 40 mg twice daily.  Neurology was consulted and recommended to stay off aspirin and Plavix.  Interval summary Patient is accompanied by her daughter today.  Patient is recovering from stroke slowly.  She does have some memory issues, tremors in her hand, difficulty identifying colors, weakness.  Her appetite is okay.  She had a follow-up with her neurologist, Dr. Manuella Ghazi who recommended not to restart aspirin and Plavix.  Her geriatrician also recommended not to restart aspirin and Plavix.  She is taking Protonix 40 mg twice daily.  She denies abdominal pain, nausea or vomiting.  Reports her stools are brown in color.  She does have postprandial urgency.  Follow-up visit 12/03/2018 Patient is doing well from GI standpoint.  She is taking Protonix  twice daily.  She denies black stools.  Last night, she had 3 episodes of greenish runny bowel movements 15 minutes apart.  This morning she had regular formed bowel movement.  She reports mild lower abdominal cramps.  She generally has frozen sausages for breakfast daily at her facility.  She denies nausea, vomiting, fever, chills.  Patient is accompanied by her daughter today.  She reports feeling tired all day.  She does very minimal activity.  Tries to exercise but very little.  She gained about 10 pounds since last visit.  NSAIDs: None  Antiplts/Anticoagulants/Anti thrombotics: None  GI Procedures:  EGD 05/20/2018 - Normal esophagus. - Non-bleeding gastric ulcer with no stigmata of bleeding. - Normal examined duodenum. - Biopsies were taken with a cold forceps for Helicobacter pylori testing.  EGD 08/31/2018 - Normal duodenal bulb and second portion of the duodenum. - Non-bleeding gastric ulcer with a clean ulcer base (Forrest Class III). Biopsied. - Normal gastroesophageal junction and esophagus. DIAGNOSIS:  A. STOMCAH, RANDOM GASTRIC; BIOPSY:  - BENIGN GASTRIC MUCOSA WITH NO SIGNIFICANT HISTOPATHOLOGICAL  FINDINGS.  - ACTIVITY, INTESTINAL METAPLASIA AND DYSPLASIA ARE NOT PRESENT.  - MICROORGANISMS ARE NOT IDENTIFIED ON THE ROUTINE STAIN.    Past Medical History:  Diagnosis Date  . Cancer [facility])    Right breast  . COPD (chronic obstructive pulmonary disease) ([place])   . Diabetes mellitus without complication ([place])   . Hypertension     Past Surgical History:  Procedure Laterality Date  . ABDOMINAL SURGERY    .  APPENDECTOMY    . BACK SURGERY    . BREAST SURGERY    . CHOLECYSTECTOMY    . ESOPHAGOGASTRODUODENOSCOPY N/A 08/31/2018   Procedure: ESOPHAGOGASTRODUODENOSCOPY (EGD);  Surgeon: Lin Landsman, MD;  Location: [facility] ENDOSCOPY;  Service: Gastroenterology;  Laterality: N/A;  . ESOPHAGOGASTRODUODENOSCOPY (EGD) WITH PROPOFOL N/A 05/20/2018   Procedure:  ESOPHAGOGASTRODUODENOSCOPY (EGD) WITH PROPOFOL;  Surgeon: Toledo, Benay Pike, MD;  Location: [facility] ENDOSCOPY;  Service: Gastroenterology;  Laterality: N/A;    Current Outpatient Medications:  .  acetaminophen (TYLENOL) 500 MG tablet, Take 1,000 mg by mouth every 8 (eight) hours as needed for mild pain or moderate pain., Disp: , Rfl:  .  albuterol (PROVENTIL HFA;VENTOLIN HFA) 108 (90 Base) MCG/ACT inhaler, Inhale 2 puffs into the lungs every 4 (four) hours as needed for wheezing or shortness of breath., Disp: , Rfl:  .  allopurinol (ZYLOPRIM) 300 MG tablet, , Disp: , Rfl:  .  atorvastatin (LIPITOR) 40 MG tablet, Take 40 mg by mouth at bedtime. , Disp: , Rfl:  .  Cholecalciferol (VITAMIN D-1000 MAX ST) 25 MCG (1000 UT) tablet, Take by mouth., Disp: , Rfl:  .  citalopram (CELEXA) 20 MG tablet, Take 30 mg by mouth daily. , Disp: , Rfl:  .  losartan (COZAAR) 50 MG tablet, , Disp: , Rfl:  .  Melatonin 5 MG TABS, Take 10 mg by mouth at bedtime., Disp: , Rfl:  .  metFORMIN (GLUCOPHAGE) 500 MG tablet, , Disp: , Rfl:  .  mupirocin ointment (BACTROBAN) 2 %, APPLY TO AFFECTED AREA 3 TIMES A DAY, Disp: , Rfl:  .  nystatin ointment (MYCOSTATIN), Apply 1 application topically 2 (two) times a day., Disp: , Rfl:  .  pantoprazole (PROTONIX) 40 MG tablet, Take 1 tablet (40 mg total) by mouth 2 (two) times daily., Disp: 30 tablet, Rfl: 1 .  traMADol (ULTRAM) 50 MG tablet, TAKE 1 TABLET (50 MG TOTAL) BY MOUTH EVERY 6 (SIX) HOURS AS NEEDED FOR PAIN, Disp: , Rfl:  .  vitamin B-12 (CYANOCOBALAMIN) 1000 MCG tablet, Take by mouth., Disp: , Rfl:  .  gabapentin (NEURONTIN) 100 MG capsule, Take 1 capsule (100 mg total) by mouth 3 (three) times daily for 30 days., Disp: 30 capsule, Rfl: 0 .  predniSONE (DELTASONE) 10 MG tablet, 4 TABS BY MOUTH DAILY X 3 DAYS, 3 TABS DAILY X 3 DAYS, 2 TABS DAILY X 3 DAYS, 1 TAB DAILY X 3 DAYS, Disp: , Rfl:  .  sucralfate (CARAFATE) 1 g tablet, Take 1 g by mouth 4 (four) times daily., Disp: ,  Rfl:   No family history on file.   Social History   Tobacco Use  . Smoking status: Former Research scientist (life sciences)  . Smokeless tobacco: Never Used  Substance Use Topics  . Alcohol use: No  . Drug use: No    Allergies as of 12/03/2018 - Review Complete 12/03/2018  Allergen Reaction Noted  . Morphine and related Other (See Comments) 10/28/2014  . Ace inhibitors Other (See Comments) 12/24/2013  . Doxycycline Other (See Comments) 08/24/2015  . Erythromycin ethylsuccinate Other (See Comments) 12/24/2013  . Codeine Nausea And Vomiting 10/28/2014    Review of Systems:    All systems reviewed and negative except where noted in HPI.   Physical Exam:  BP 113/69 (BP Location: Left Arm, Patient Position: Sitting, Cuff Size: Large)   Pulse 68   Temp 98.8 F (37.1 C)   Resp 16   Ht '5\' 2"'  (1.575 m)   Wt 204 lb  3.2 oz (92.6 kg)   BMI 37.35 kg/m  No LMP recorded. Patient has had a hysterectomy.  General:   Alert,  Well-developed, well-nourished, pleasant and cooperative in NAD Head:  Normocephalic and atraumatic. Eyes:  Sclera clear, no icterus.   Conjunctiva pink. Ears:  Normal auditory acuity. Nose:  No deformity, discharge, or lesions. Mouth:  No deformity or lesions,oropharynx pink & moist. Neck:  Supple; no masses or thyromegaly. Lungs:  Respirations even and unlabored.  Clear throughout to auscultation.   No wheezes, crackles, or rhonchi. No acute distress. Heart:  Regular rate and rhythm; no murmurs, clicks, rubs, or gallops. Abdomen:  Normal bowel sounds. Soft, non-tender and non-distended without masses, hepatosplenomegaly or hernias noted.  No guarding or rebound tenderness.   Rectal: Not performed Msk:  Symmetrical without gross deformities. Good, equal movement & strength bilaterally. Pulses:  Normal pulses noted. Extremities:  No clubbing or edema.  No cyanosis. Neurologic:  Alert and oriented x3; Skin:  Intact without significant lesions or rashes. No jaundice. Psych:  Alert and  cooperative. Normal mood and affect.  Imaging Studies: Reviewed  Assessment and Plan:   Alexa Lowe is a 85 y.o. female with history of diabetes, recent history of stroke, known gastric ulcer, thought to secondary to NSAIDs, negative for H. pylori, severe iron deficiency anemia here for follow-up of peptic ulcer disease.  Patient is almost recovered from stroke.  She is not reporting any symptoms or signs to suggest active GI bleed.  Labs from last visit showed significant improvement in her anemia and iron stores  Continue Protonix 40 mg twice daily  Okay to continue sucralfate as needed Recommend EGD to confirm healing of the ulcer Okay to start aspirin 81 mg daily from GI standpoint if deemed necessary   Follow up based on the EGD results   Cephas Darby, MD

## 2018-12-04 DIAGNOSIS — Z0000 Encounter for general adult medical examination without abnormal findings: Secondary | ICD-10-CM | POA: Diagnosis not present

## 2018-12-04 DIAGNOSIS — E7800 Pure hypercholesterolemia, unspecified: Secondary | ICD-10-CM | POA: Diagnosis not present

## 2018-12-04 DIAGNOSIS — D508 Other iron deficiency anemias: Secondary | ICD-10-CM | POA: Diagnosis not present

## 2018-12-04 DIAGNOSIS — E1169 Type 2 diabetes mellitus with other specified complication: Secondary | ICD-10-CM | POA: Diagnosis not present

## 2018-12-04 DIAGNOSIS — N183 Chronic kidney disease, stage 3 (moderate): Secondary | ICD-10-CM | POA: Diagnosis not present

## 2018-12-04 DIAGNOSIS — Z6837 Body mass index (BMI) 37.0-37.9, adult: Secondary | ICD-10-CM | POA: Diagnosis not present

## 2018-12-04 DIAGNOSIS — I10 Essential (primary) hypertension: Secondary | ICD-10-CM | POA: Diagnosis not present

## 2018-12-04 DIAGNOSIS — E785 Hyperlipidemia, unspecified: Secondary | ICD-10-CM | POA: Diagnosis not present

## 2018-12-15 ENCOUNTER — Other Ambulatory Visit
Admission: RE | Admit: 2018-12-15 | Discharge: 2018-12-15 | Disposition: A | Discharge status: Home / Self Care | Payer: Medicare HMO | Source: Ambulatory Visit | Attending: Gastroenterology | Admitting: Gastroenterology

## 2018-12-15 ENCOUNTER — Other Ambulatory Visit: Payer: Self-pay

## 2018-12-15 ENCOUNTER — Telehealth: Payer: Self-pay | Admitting: Gastroenterology

## 2018-12-15 DIAGNOSIS — E7800 Pure hypercholesterolemia, unspecified: Secondary | ICD-10-CM | POA: Diagnosis not present

## 2018-12-15 DIAGNOSIS — Z01812 Encounter for preprocedural laboratory examination: Secondary | ICD-10-CM | POA: Diagnosis not present

## 2018-12-15 DIAGNOSIS — Z20828 Contact with and (suspected) exposure to other viral communicable diseases: Secondary | ICD-10-CM | POA: Insufficient documentation

## 2018-12-15 DIAGNOSIS — I10 Essential (primary) hypertension: Secondary | ICD-10-CM | POA: Diagnosis not present

## 2018-12-15 DIAGNOSIS — N183 Chronic kidney disease, stage 3 (moderate): Secondary | ICD-10-CM | POA: Diagnosis not present

## 2018-12-15 DIAGNOSIS — E1169 Type 2 diabetes mellitus with other specified complication: Secondary | ICD-10-CM | POA: Diagnosis not present

## 2018-12-15 DIAGNOSIS — E785 Hyperlipidemia, unspecified: Secondary | ICD-10-CM | POA: Diagnosis not present

## 2018-12-15 DIAGNOSIS — D508 Other iron deficiency anemias: Secondary | ICD-10-CM | POA: Diagnosis not present

## 2018-12-15 NOTE — Telephone Encounter (Signed)
Pt left vm she was supposed to do the Covid19 test and went over there and no one was there she was sitting there for an  Hour please call pt she has a procedure coming up

## 2018-12-15 NOTE — Telephone Encounter (Signed)
Pt daughter left  vm pt is supposed to have a procedure done and had her covi19 test today instead of Monday because the testing site was closed and she would like to know if the results will be back on time for her to do the procedure bc she will be driving from out of town to accompany pt please call her at  [phone]

## 2018-12-16 LAB — SARS CORONAVIRUS 2 (TAT 6-24 HRS): SARS Coronavirus 2: NEGATIVE

## 2018-12-16 NOTE — Telephone Encounter (Signed)
Pt's daughter has been notified of negative test results and pt will have procedure tomorrow 12/17/2018

## 2018-12-17 ENCOUNTER — Ambulatory Visit: Payer: Medicare HMO | Admitting: Certified Registered"

## 2018-12-17 ENCOUNTER — Telehealth: Payer: Self-pay | Admitting: Gastroenterology

## 2018-12-17 ENCOUNTER — Encounter
Admission: RE | Disposition: A | Discharge status: Home / Self Care | Payer: Self-pay | Source: Home / Self Care | Attending: Gastroenterology

## 2018-12-17 ENCOUNTER — Ambulatory Visit
Admission: RE | Admit: 2018-12-17 | Discharge: 2018-12-17 | Disposition: A | Discharge status: Home / Self Care | Payer: Medicare HMO | Attending: Gastroenterology | Admitting: Gastroenterology

## 2018-12-17 DIAGNOSIS — Z881 Allergy status to other antibiotic agents status: Secondary | ICD-10-CM | POA: Insufficient documentation

## 2018-12-17 DIAGNOSIS — Z853 Personal history of malignant neoplasm of breast: Secondary | ICD-10-CM | POA: Diagnosis not present

## 2018-12-17 DIAGNOSIS — Z888 Allergy status to other drugs, medicaments and biological substances status: Secondary | ICD-10-CM | POA: Insufficient documentation

## 2018-12-17 DIAGNOSIS — Z87891 Personal history of nicotine dependence: Secondary | ICD-10-CM | POA: Diagnosis not present

## 2018-12-17 DIAGNOSIS — Z885 Allergy status to narcotic agent status: Secondary | ICD-10-CM | POA: Diagnosis not present

## 2018-12-17 DIAGNOSIS — I10 Essential (primary) hypertension: Secondary | ICD-10-CM | POA: Insufficient documentation

## 2018-12-17 DIAGNOSIS — Z09 Encounter for follow-up examination after completed treatment for conditions other than malignant neoplasm: Secondary | ICD-10-CM | POA: Diagnosis not present

## 2018-12-17 DIAGNOSIS — Z79899 Other long term (current) drug therapy: Secondary | ICD-10-CM | POA: Diagnosis not present

## 2018-12-17 DIAGNOSIS — I6930 Unspecified sequelae of cerebral infarction: Secondary | ICD-10-CM | POA: Insufficient documentation

## 2018-12-17 DIAGNOSIS — E119 Type 2 diabetes mellitus without complications: Secondary | ICD-10-CM | POA: Insufficient documentation

## 2018-12-17 DIAGNOSIS — Z7984 Long term (current) use of oral hypoglycemic drugs: Secondary | ICD-10-CM | POA: Diagnosis not present

## 2018-12-17 DIAGNOSIS — K253 Acute gastric ulcer without hemorrhage or perforation: Secondary | ICD-10-CM | POA: Diagnosis not present

## 2018-12-17 DIAGNOSIS — J449 Chronic obstructive pulmonary disease, unspecified: Secondary | ICD-10-CM | POA: Insufficient documentation

## 2018-12-17 DIAGNOSIS — K2960 Other gastritis without bleeding: Secondary | ICD-10-CM | POA: Diagnosis not present

## 2018-12-17 DIAGNOSIS — K259 Gastric ulcer, unspecified as acute or chronic, without hemorrhage or perforation: Secondary | ICD-10-CM | POA: Insufficient documentation

## 2018-12-17 DIAGNOSIS — K257 Chronic gastric ulcer without hemorrhage or perforation: Secondary | ICD-10-CM

## 2018-12-17 HISTORY — PX: ESOPHAGOGASTRODUODENOSCOPY (EGD) WITH PROPOFOL: SHX5813

## 2018-12-17 LAB — GLUCOSE, CAPILLARY: Glucose-Capillary: 138 mg/dL — ABNORMAL HIGH (ref 70–99)

## 2018-12-17 SURGERY — ESOPHAGOGASTRODUODENOSCOPY (EGD) WITH PROPOFOL
Anesthesia: General

## 2018-12-17 MED ORDER — SODIUM CHLORIDE 0.9 % IV SOLN
INTRAVENOUS | Status: DC
  Administered 2018-12-17: 1000 mL via INTRAVENOUS

## 2018-12-17 MED ORDER — PROPOFOL 500 MG/50ML IV EMUL
INTRAVENOUS | Status: DC | PRN
  Administered 2018-12-17: 150 ug/kg/min via INTRAVENOUS

## 2018-12-17 MED ORDER — OMEPRAZOLE 40 MG PO CPDR: 40.0000 mg | DELAYED_RELEASE_CAPSULE | Freq: Every day | ORAL | 2 refills | Status: DC

## 2018-12-17 MED ORDER — GLYCOPYRROLATE 0.2 MG/ML IJ SOLN
INTRAMUSCULAR | Status: AC
  Filled 2018-12-17: qty 1

## 2018-12-17 MED ORDER — PROPOFOL 500 MG/50ML IV EMUL
INTRAVENOUS | Status: AC
  Filled 2018-12-17: qty 50

## 2018-12-17 MED ORDER — PROPOFOL 10 MG/ML IV BOLUS
INTRAVENOUS | Status: DC | PRN
  Administered 2018-12-17: 70 mg via INTRAVENOUS
  Administered 2018-12-17: 10 mg via INTRAVENOUS

## 2018-12-17 MED ORDER — GLYCOPYRROLATE 0.2 MG/ML IJ SOLN
INTRAMUSCULAR | Status: DC | PRN
  Administered 2018-12-17: 0.2 mg via INTRAVENOUS

## 2018-12-17 NOTE — Transfer of Care (Signed)
Immediate Anesthesia Transfer of Care Note  Patient: Alexa Lowe  Procedure(s) Performed: Procedure(s): ESOPHAGOGASTRODUODENOSCOPY (EGD) WITH PROPOFOL (N/A)  Patient Location: PACU and Endoscopy Unit  Anesthesia Type:General  Level of Consciousness: sedated  Airway & Oxygen Therapy: Patient Spontanous Breathing and Patient connected to nasal cannula oxygen  Post-op Assessment: Report given to RN and Post -op Vital signs reviewed and stable  Post vital signs: Reviewed and stable  Last Vitals:  Vitals:   12/17/18 0844 12/17/18 0942  BP: (!) 155/63 (!) 93/48  Pulse: (!) 58   Resp: 16 20  Temp: (!) 36.3 C (!) 36.4 C  SpO2: 0000000 [id number]    Complications: No apparent anesthesia complications

## 2018-12-17 NOTE — Anesthesia Preprocedure Evaluation (Addendum)
Anesthesia Evaluation  Patient identified by MRN, date of birth, ID band Patient awake    Reviewed: Allergy & Precautions, H&P , NPO status , Patient's Chart, lab work & pertinent test results  Airway Mallampati: II  TM Distance: >3 FB     Dental   Pulmonary COPD, former smoker,           Cardiovascular hypertension, (-) Past MI      Neuro/Psych CVA, Residual Symptoms negative psych ROS   GI/Hepatic negative GI ROS, Neg liver ROS,   Endo/Other  diabetes  Renal/GU negative Renal ROS  negative genitourinary   Musculoskeletal   Abdominal   Peds  Hematology  (+) Blood dyscrasia, anemia ,   Anesthesia Other Findings Past Medical History: No date: Cancer ([place])     Comment:  Right breast No date: COPD (chronic obstructive pulmonary disease) ([facility]) No date: Diabetes mellitus without complication ([facility]) No date: Hypertension  Past Surgical History: No date: ABDOMINAL SURGERY No date: APPENDECTOMY No date: BACK SURGERY No date: BREAST SURGERY No date: CHOLECYSTECTOMY 08/31/2018: ESOPHAGOGASTRODUODENOSCOPY; N/A     Comment:  Procedure: ESOPHAGOGASTRODUODENOSCOPY (EGD);  Surgeon:               Lin Landsman, MD;  Location: [facility] ENDOSCOPY;                Service: Gastroenterology;  Laterality: N/A; 05/20/2018: ESOPHAGOGASTRODUODENOSCOPY (EGD) WITH PROPOFOL; N/A     Comment:  Procedure: ESOPHAGOGASTRODUODENOSCOPY (EGD) WITH               PROPOFOL;  Surgeon: Toledo, Benay Pike, MD;  Location:               [facility] ENDOSCOPY;  Service: Gastroenterology;  Laterality:               N/A;  BMI    Body Mass Index: 40.24 kg/m      Reproductive/Obstetrics negative OB ROS                            Anesthesia Physical Anesthesia Plan  ASA: III  Anesthesia Plan: General   Post-op Pain Management:    Induction:   PONV Risk Score and Plan: Propofol infusion and TIVA  Airway Management Planned:  Natural Airway and Nasal Cannula  Additional Equipment:   Intra-op Plan:   Post-operative Plan:   Informed Consent: I have reviewed the patients History and Physical, chart, labs and discussed the procedure including the risks, benefits and alternatives for the proposed anesthesia with the patient or authorized representative who has indicated his/her understanding and acceptance.     Dental Advisory Given  Plan Discussed with: Anesthesiologist and CRNA  Anesthesia Plan Comments:         Anesthesia Quick Evaluation

## 2018-12-17 NOTE — Telephone Encounter (Signed)
Left vm to offer 3 month f/u apt  °

## 2018-12-17 NOTE — H&P (Signed)
Alexa Darby, MD [address]  [place]  [place], [place] [address]  Main: [phone]  Fax: [phone] Pager: [phone]  Primary Care Physician:  Alexa Body, MD Primary Gastroenterologist:  Dr. Cephas Lowe  Pre-Procedure History & Physical: HPI:  Alexa Lowe is a 85 y.o. female is here for an endoscopy.   Past Medical History:  Diagnosis Date  . Cancer [facility])    Right breast  . COPD (chronic obstructive pulmonary disease) ([place])   . Diabetes mellitus without complication ([place])   . Hypertension     Past Surgical History:  Procedure Laterality Date  . ABDOMINAL SURGERY    . APPENDECTOMY    . BACK SURGERY    . BREAST SURGERY    . CHOLECYSTECTOMY    . ESOPHAGOGASTRODUODENOSCOPY N/A 08/31/2018   Procedure: ESOPHAGOGASTRODUODENOSCOPY (EGD);  Surgeon: Alexa Landsman, MD;  Location: [facility] ENDOSCOPY;  Service: Gastroenterology;  Laterality: N/A;  . ESOPHAGOGASTRODUODENOSCOPY (EGD) WITH PROPOFOL N/A 05/20/2018   Procedure: ESOPHAGOGASTRODUODENOSCOPY (EGD) WITH PROPOFOL;  Surgeon: Toledo, Benay Pike, MD;  Location: [facility] ENDOSCOPY;  Service: Gastroenterology;  Laterality: N/A;    Prior to Admission medications   Medication Sig Start Date End Date Taking? Authorizing Provider  acetaminophen (TYLENOL) 500 MG tablet Take 1,000 mg by mouth every 8 (eight) hours as needed for mild pain or moderate pain.   Yes [provider]  albuterol (PROVENTIL HFA;VENTOLIN HFA) 108 (90 Base) MCG/ACT inhaler Inhale 2 puffs into the lungs every 4 (four) hours as needed for wheezing or shortness of breath. 02/26/17  Yes [provider]  allopurinol (ZYLOPRIM) 300 MG tablet  10/29/18  Yes [provider]  atorvastatin (LIPITOR) 40 MG tablet Take 40 mg by mouth at bedtime.    Yes [provider]  Cholecalciferol (VITAMIN D-1000 MAX ST) 25 MCG (1000 UT) tablet Take by mouth.   Yes [provider]  citalopram (CELEXA) 20 MG tablet Take 30  mg by mouth daily.    Yes [provider]  losartan (COZAAR) 50 MG tablet  11/28/18  Yes [provider]  Melatonin 5 MG TABS Take 10 mg by mouth at bedtime.   Yes [provider]  metFORMIN (GLUCOPHAGE) 500 MG tablet  10/29/18  Yes [provider]  mupirocin ointment (BACTROBAN) 2 % APPLY TO AFFECTED AREA 3 TIMES A DAY 09/22/18  Yes [provider]  nystatin ointment (MYCOSTATIN) Apply 1 application topically 2 (two) times a day. 08/19/18  Yes [provider]  sucralfate (CARAFATE) 1 g tablet Take 1 g by mouth 4 (four) times daily. 07/17/18  Yes [provider]  traMADol (ULTRAM) 50 MG tablet TAKE 1 TABLET (50 MG TOTAL) BY MOUTH EVERY 6 (SIX) HOURS AS NEEDED FOR PAIN 11/03/18  Yes [provider]  vitamin B-12 (CYANOCOBALAMIN) 1000 MCG tablet Take by mouth.   Yes [provider]  gabapentin (NEURONTIN) 100 MG capsule Take 1 capsule (100 mg total) by mouth 3 (three) times daily for 30 days. 09/02/18 10/02/18  Salary, Avel Peace, MD  pantoprazole (PROTONIX) 40 MG tablet Take 1 tablet (40 mg total) by mouth 2 (two) times daily. Patient not taking: Reported on 12/17/2018 12/03/18 03/03/19  Alexa Landsman, MD  predniSONE (DELTASONE) 10 MG tablet 4 TABS BY MOUTH DAILY X 3 DAYS, 3 TABS DAILY X 3 DAYS, 2 TABS DAILY X 3 DAYS, 1 TAB DAILY X 3 DAYS 09/21/18   [provider]    Allergies as of 12/03/2018 - Review Complete  12/03/2018  Allergen Reaction Noted  . Morphine and related Other (See Comments) 10/28/2014  . Ace inhibitors Other (See Comments) 12/24/2013  . Doxycycline Other (See Comments) 08/24/2015  . Erythromycin ethylsuccinate Other (See Comments) 12/24/2013  . Codeine Nausea And Vomiting 10/28/2014    No family history on file.  Social History   Socioeconomic History  . Marital status: Widowed    Spouse name: Not on file  . Number of children: Not on file  . Years of education: Not on file  . Highest  education level: Not on file  Occupational History  . Not on file  Social Needs  . Financial resource strain: Not hard at all  . Food insecurity    Worry: Never true    Inability: Never true  . Transportation needs    Medical: No    Non-medical: No  Tobacco Use  . Smoking status: Former Research scientist (life sciences)  . Smokeless tobacco: Never Used  Substance and Sexual Activity  . Alcohol use: No  . Drug use: No  . Sexual activity: Not Currently  Lifestyle  . Physical activity    Days per week: 0 days    Minutes per session: 0 min  . Stress: Not at all  Relationships  . Social Herbalist on phone: Once a week    Gets together: Once a week    Attends religious service: Never    Active member of club or organization: No    Attends meetings of clubs or organizations: Never    Relationship status: Widowed  . Intimate partner violence    Fear of current or ex partner: No    Emotionally abused: No    Physically abused: No    Forced sexual activity: No  Other Topics Concern  . Not on file  Social History Narrative   Lives alone in own apartment and uses a walker, still drives,or uses transport.    Review of Systems: See HPI, otherwise negative ROS  Physical Exam: BP (!) 155/63   Pulse (!) 58   Temp (!) 97.4 F (36.3 C) (Tympanic)   Resp 16   Ht 5\' 2"  (1.575 m)   Wt 99.8 kg   SpO2 97%   BMI 40.24 kg/m  General:   Alert,  pleasant and cooperative in NAD Head:  Normocephalic and atraumatic. Neck:  Supple; no masses or thyromegaly. Lungs:  Clear throughout to auscultation.    Heart:  Regular rate and rhythm. Abdomen:  Soft, nontender and nondistended. Normal bowel sounds, without guarding, and without rebound.   Neurologic:  Alert and  oriented x4;  grossly normal neurologically.  Impression/Plan: Alexa Lowe is here for an endoscopy to be performed for follow up of gastric ulcer  Risks, benefits, limitations, and alternatives regarding  endoscopy have been reviewed  with the patient.  Questions have been answered.  All parties agreeable.   Sherri Sear, MD  12/17/2018, 9:15 AM

## 2018-12-17 NOTE — Anesthesia Procedure Notes (Signed)
Date/Time: 12/17/2018 9:25 AM Performed by: Doreen Salvage, CRNA Pre-anesthesia Checklist: Patient identified, Emergency Drugs available, Suction available and Patient being monitored Patient Re-evaluated:Patient Re-evaluated prior to induction Oxygen Delivery Method: Nasal cannula Induction Type: IV induction Dental Injury: Teeth and Oropharynx as per pre-operative assessment  Comments: Nasal cannula with etCO2 monitoring

## 2018-12-17 NOTE — Anesthesia Post-op Follow-up Note (Signed)
Anesthesia QCDR form completed.        

## 2018-12-17 NOTE — Anesthesia Postprocedure Evaluation (Signed)
Anesthesia Post Note  Patient: Alexa Lowe  Procedure(s) Performed: ESOPHAGOGASTRODUODENOSCOPY (EGD) WITH PROPOFOL (N/A )  Patient location during evaluation: PACU Anesthesia Type: General Level of consciousness: awake and alert Pain management: pain level controlled Vital Signs Assessment: post-procedure vital signs reviewed and stable Respiratory status: spontaneous breathing, nonlabored ventilation and respiratory function stable Cardiovascular status: blood pressure returned to baseline and stable Postop Assessment: no apparent nausea or vomiting Anesthetic complications: no     Last Vitals:  Vitals:   12/17/18 1002 12/17/18 1012  BP: 119/77 137/71  Pulse: 60 (!) 59  Resp: 17 17  Temp:    SpO2: 96% 96%    Last Pain:  Vitals:   12/17/18 1012  TempSrc:   PainSc: 0-No pain                 Durenda Hurt

## 2018-12-17 NOTE — Op Note (Signed)
[facility] Patient Name: [place] Procedure Date: 12/17/2018 9:20 AM MRN: [id number] Account #: [id number] Date of Birth: 04-Dec-1930 Admit Type: Outpatient Age: 85 Room: [facility] ENDO ROOM 2 Gender: Female Note Status: Finalized Procedure:            Upper GI endoscopy Indications:          Follow-up of acute gastric ulcer Providers:            Lin Landsman MD, MD Referring MD:         Dion Body (Referring MD) Medicines:            Monitored Anesthesia Care Complications:        No immediate complications. Estimated blood loss: None. Procedure:            Pre-Anesthesia Assessment:                       - Prior to the procedure, a History and Physical was                        performed, and patient medications and allergies were                        reviewed. The patient is competent. The risks and                        benefits of the procedure and the sedation options and                        risks were discussed with the patient. All questions                        were answered and informed consent was obtained.                        Patient identification and proposed procedure were                        verified by the physician, the nurse, the                        anesthesiologist, the anesthetist and the technician in                        the pre-procedure area in the procedure room in the                        endoscopy suite. Mental Status Examination: alert and                        oriented. Airway Examination: normal oropharyngeal                        airway and neck mobility. Respiratory Examination:                        clear to auscultation. CV Examination: normal.                        Prophylactic Antibiotics: The patient does not require  prophylactic antibiotics. Prior Anticoagulants: The                        patient has taken no previous anticoagulant or             antiplatelet agents. ASA Grade Assessment: III - A                        patient with severe systemic disease. After reviewing                        the risks and benefits, the patient was deemed in                        satisfactory condition to undergo the procedure. The                        anesthesia plan was to use monitored anesthesia care                        (MAC). Immediately prior to administration of                        medications, the patient was re-assessed for adequacy                        to receive sedatives. The heart rate, respiratory rate,                        oxygen saturations, blood pressure, adequacy of                        pulmonary ventilation, and response to care were                        monitored throughout the procedure. The physical status                        of the patient was re-assessed after the procedure.                       After obtaining informed consent, the endoscope was                        passed under direct vision. Throughout the procedure,                        the patient's blood pressure, pulse, and oxygen                        saturations were monitored continuously. The Endoscope                        was introduced through the mouth, and advanced to the                        second part of duodenum. The upper GI endoscopy was                        accomplished without difficulty. The patient tolerated  the procedure well. Findings:      The duodenal bulb and second portion of the duodenum were normal.      One healing non-bleeding superficial gastric ulcer with a clean ulcer       base (Forrest Class III) was found on the lesser curvature of the       gastric antrum. The lesion was 2 mm in largest dimension. Significantly       healed compared to 08/2018, margins raised likely granulation tissue.       Biopsies were taken with a cold forceps for histology.      The  gastroesophageal junction and examined esophagus were normal. Impression:           - Normal duodenal bulb and second portion of the                        duodenum.                       - Non-bleeding gastric ulcer with a clean ulcer base                        (Forrest Class III). Biopsied.                       - Normal gastroesophageal junction and esophagus. Recommendation:       - Await pathology results.                       - Discharge patient to home (with escort).                       - Resume previous diet today.                       - Continue present medications.                       - Continue current proton pump inhibitor for 3 more                        months. Procedure Code(s):    --- Professional ---                       [phone], Esophagogastroduodenoscopy, flexible, transoral;                        with biopsy, single or multiple Diagnosis Code(s):    --- Professional ---                       K25.9, Gastric ulcer, unspecified as acute or chronic,                        without hemorrhage or perforation                       K25.3, Acute gastric ulcer without hemorrhage or                        perforation CPT copyright 2019 American Medical Association. All rights reserved. The codes documented in this report are preliminary and upon coder review may  be revised to meet current  compliance requirements. Dr. Ulyess Mort Lin Landsman MD, MD 12/17/2018 9:39:57 AM This report has been signed electronically. Number of Addenda: 0 Note Initiated On: 12/17/2018 9:20 AM Estimated Blood Loss: Estimated blood loss: none.      [facility]

## 2018-12-17 NOTE — Telephone Encounter (Signed)
-----   Message from Lin Landsman, MD sent at 12/17/2018  9:53 AM EDT ----- Regarding: follow up Schedule follow up in 3 months F/u of gastric ulcer  RV

## 2018-12-18 ENCOUNTER — Encounter: Payer: Self-pay | Admitting: Gastroenterology

## 2018-12-18 LAB — SURGICAL PATHOLOGY

## 2018-12-21 ENCOUNTER — Telehealth: Payer: Self-pay | Admitting: Gastroenterology

## 2018-12-21 DIAGNOSIS — M545 Low back pain: Secondary | ICD-10-CM | POA: Diagnosis not present

## 2018-12-21 DIAGNOSIS — M150 Primary generalized (osteo)arthritis: Secondary | ICD-10-CM | POA: Diagnosis not present

## 2018-12-21 NOTE — Telephone Encounter (Signed)
Left vm to offer 3 month f/u apt with Dr. Marius Ditch

## 2018-12-21 NOTE — Telephone Encounter (Signed)
-----   Message from Lin Landsman, MD sent at 12/17/2018  9:53 AM EDT ----- Regarding: follow up Schedule follow up in 3 months F/u of gastric ulcer  RV

## 2019-01-05 ENCOUNTER — Telehealth: Payer: Self-pay

## 2019-01-05 NOTE — Telephone Encounter (Signed)
Got PA for patient Pantoprazole. Looks like patient is D/C of this medication and is now taking Omeprazole 40mg . Tried to do the PA just incase patient was taking medication when I filled out the demographic information on cover my meds and submitted that information it said this medication did not need PA.

## 2019-01-13 DIAGNOSIS — C50112 Malignant neoplasm of central portion of left female breast: Secondary | ICD-10-CM | POA: Diagnosis not present

## 2019-01-13 DIAGNOSIS — Z4432 Encounter for fitting and adjustment of external left breast prosthesis: Secondary | ICD-10-CM | POA: Diagnosis not present

## 2019-01-20 DIAGNOSIS — M150 Primary generalized (osteo)arthritis: Secondary | ICD-10-CM | POA: Diagnosis not present

## 2019-01-20 DIAGNOSIS — M545 Low back pain: Secondary | ICD-10-CM | POA: Diagnosis not present

## 2019-02-20 DIAGNOSIS — M545 Low back pain: Secondary | ICD-10-CM | POA: Diagnosis not present

## 2019-02-20 DIAGNOSIS — M150 Primary generalized (osteo)arthritis: Secondary | ICD-10-CM | POA: Diagnosis not present

## 2019-02-23 DIAGNOSIS — E119 Type 2 diabetes mellitus without complications: Secondary | ICD-10-CM | POA: Diagnosis not present

## 2019-02-25 DIAGNOSIS — Z0100 Encounter for examination of eyes and vision without abnormal findings: Secondary | ICD-10-CM | POA: Diagnosis not present

## 2019-03-08 ENCOUNTER — Encounter: Payer: Self-pay | Admitting: Gastroenterology

## 2019-03-08 ENCOUNTER — Ambulatory Visit (INDEPENDENT_AMBULATORY_CARE_PROVIDER_SITE_OTHER): Payer: Medicare HMO | Admitting: Gastroenterology

## 2019-03-08 DIAGNOSIS — K279 Peptic ulcer, site unspecified, unspecified as acute or chronic, without hemorrhage or perforation: Secondary | ICD-10-CM | POA: Diagnosis not present

## 2019-03-08 NOTE — Progress Notes (Signed)
Sherri Sear, MD [address]  [place]  [place], [place] [address]  Main: [phone]  Fax: [phone]    Gastroenterology Consultation Tele Visit  Referring Provider:     Dion Body, MD Primary Care Physician:  Dion Body, MD Primary Gastroenterologist:  Dr. Cephas Darby Reason for Consultation:    Follow-up of peptic ulcer        HPI:   Alexa Lowe is a 85 y.o. female referred by Dr. Dion Body, MD  for consultation & management of history of gastric ulcer  Virtual Visit via Telephone Note  I connected with Alexa Lowe on 03/08/19 at  2:00 PM EST by telephone and verified that I am speaking with the correct person using two identifiers.   I discussed the limitations, risks, security and privacy concerns of performing an evaluation and management service by telephone and the availability of in person appointments. I also discussed with the patient that there may be a patient responsible charge related to this service. The patient expressed understanding and agreed to proceed.  Location of the Patient: Home  Location of the provider: Office  Persons participating in the visit: Patient and provider only  History of Present Illness: I called Alexa Lowe to discuss regarding her peptic ulcer.  She is no longer experiencing black stool, abdominal pain, nausea or vomiting.  She is taking 40 mg daily.  Few days ago, she had bright red blood per rectum which was secondary to hemorrhoids and it resolved after using hemorrhoidal cream.  She reports feeling well otherwise.  Her stools are brown in color.   NSAIDs: None  Antiplts/Anticoagulants/Anti thrombotics: None  GI Procedures: EGD 05/20/2018 - Normal esophagus. - Non-bleeding gastric ulcer with no stigmata of bleeding. - Normal examined duodenum. - Biopsies were taken with a cold forceps for Helicobacter pylori testing.  EGD 08/31/2018 - Normal duodenal bulb and second portion of  the duodenum. - Non-bleeding gastric ulcer with a clean ulcer base (Forrest Class III). Biopsied. - Normal gastroesophageal junction and esophagus. DIAGNOSIS:  A. STOMCAH, RANDOM GASTRIC; BIOPSY:  - BENIGN GASTRIC MUCOSA WITH NO SIGNIFICANT HISTOPATHOLOGICAL  FINDINGS.  - ACTIVITY, INTESTINAL METAPLASIA AND DYSPLASIA ARE NOT PRESENT.  - MICROORGANISMS ARE NOT IDENTIFIED ON THE ROUTINE STAIN.  EGD 12/17/2018 - Normal duodenal bulb and second portion of the duodenum. - Non-bleeding gastric ulcer with a clean ulcer base (Forrest Class III). Biopsied. - Normal gastroesophageal junction and esophagus. DIAGNOSIS:  A. GASTRIC ULCER, ANTRUM; COLD BIOPSY:  - GASTRIC ANTRAL MUCOSA WITH FOVEOLAR HYPERPLASIA AND LAMINA PROPRIA  EDEMA, COMPATIBLE WITH REACTIVE GASTROPATHY.  - NEGATIVE FOR H. PYLORI, DYSPLASIA, AND MALIGNANCY.   Past Medical History:  Diagnosis Date  . Cancer [facility])    Right breast  . COPD (chronic obstructive pulmonary disease) ([place])   . Diabetes mellitus without complication ([place])   . Hypertension     Past Surgical History:  Procedure Laterality Date  . ABDOMINAL SURGERY    . APPENDECTOMY    . BACK SURGERY    . BREAST SURGERY    . CHOLECYSTECTOMY    . ESOPHAGOGASTRODUODENOSCOPY N/A 08/31/2018   Procedure: ESOPHAGOGASTRODUODENOSCOPY (EGD);  Surgeon: Lin Landsman, MD;  Location: [facility] ENDOSCOPY;  Service: Gastroenterology;  Laterality: N/A;  . ESOPHAGOGASTRODUODENOSCOPY (EGD) WITH PROPOFOL N/A 05/20/2018   Procedure: ESOPHAGOGASTRODUODENOSCOPY (EGD) WITH PROPOFOL;  Surgeon: Toledo, Benay Pike, MD;  Location: [facility] ENDOSCOPY;  Service: Gastroenterology;  Laterality: N/A;  . ESOPHAGOGASTRODUODENOSCOPY (EGD) WITH PROPOFOL N/A 12/17/2018   Procedure:  ESOPHAGOGASTRODUODENOSCOPY (EGD) WITH PROPOFOL;  Surgeon: Lin Landsman, MD;  Location: [facility] ENDOSCOPY;  Service: Gastroenterology;  Laterality: N/A;    Current Outpatient Medications:  .  acetaminophen (TYLENOL) 500 MG  tablet, Take 1,000 mg by mouth every 8 (eight) hours as needed for mild pain or moderate pain., Disp: , Rfl:  .  albuterol (PROVENTIL HFA;VENTOLIN HFA) 108 (90 Base) MCG/ACT inhaler, Inhale 2 puffs into the lungs every 4 (four) hours as needed for wheezing or shortness of breath., Disp: , Rfl:  .  allopurinol (ZYLOPRIM) 300 MG tablet, , Disp: , Rfl:  .  atorvastatin (LIPITOR) 40 MG tablet, Take 40 mg by mouth at bedtime. , Disp: , Rfl:  .  Cholecalciferol (VITAMIN D-1000 MAX ST) 25 MCG (1000 UT) tablet, Take by mouth., Disp: , Rfl:  .  citalopram (CELEXA) 20 MG tablet, Take 30 mg by mouth daily. , Disp: , Rfl:  .  gabapentin (NEURONTIN) 100 MG capsule, Take 1 capsule (100 mg total) by mouth 3 (three) times daily for 30 days., Disp: 30 capsule, Rfl: 0 .  losartan (COZAAR) 50 MG tablet, , Disp: , Rfl:  .  Melatonin 5 MG TABS, Take 10 mg by mouth at bedtime., Disp: , Rfl:  .  metFORMIN (GLUCOPHAGE) 500 MG tablet, , Disp: , Rfl:  .  mupirocin ointment (BACTROBAN) 2 %, APPLY TO AFFECTED AREA 3 TIMES A DAY, Disp: , Rfl:  .  nystatin ointment (MYCOSTATIN), Apply 1 application topically 2 (two) times a day., Disp: , Rfl:  .  omeprazole (PRILOSEC) 40 MG capsule, Take 1 capsule (40 mg total) by mouth daily before breakfast for 30 doses., Disp: 30 capsule, Rfl: 2 .  traMADol (ULTRAM) 50 MG tablet, TAKE 1 TABLET (50 MG TOTAL) BY MOUTH EVERY 6 (SIX) HOURS AS NEEDED FOR PAIN, Disp: , Rfl:  .  vitamin B-12 (CYANOCOBALAMIN) 1000 MCG tablet, Take by mouth., Disp: , Rfl:  .  sucralfate (CARAFATE) 1 g tablet, Take 1 g by mouth 4 (four) times daily., Disp: , Rfl:    History reviewed. No pertinent family history.   Social History   Tobacco Use  . Smoking status: Former Research scientist (life sciences)  . Smokeless tobacco: Never Used  Substance Use Topics  . Alcohol use: No  . Drug use: No    Allergies as of 03/08/2019 - Review Complete 03/08/2019  Allergen Reaction Noted  . Morphine and related Other (See Comments) 10/28/2014  .  Ace inhibitors Other (See Comments) 12/24/2013  . Doxycycline Other (See Comments) 08/24/2015  . Erythromycin ethylsuccinate Other (See Comments) 12/24/2013  . Codeine Nausea And Vomiting 10/28/2014     Imaging Studies: Reviewed  Assessment and Plan:   Alexa Lowe is a 85 y.o. female with history of diabetes, recent history of stroke, known gastric ulcer, thought to secondary to NSAIDs, negative for H. pylori, severe iron deficiency anemia here for follow-up of peptic ulcer disease.  Patient has almost recovered from stroke.  She is not reporting any symptoms or signs to suggest active GI bleed.    Anemia resolved, iron stores  Continue omeprazole 40 mg daily including refills and then stop Do not recommend further endoscopic evaluation at this time Advised her to avoid heavy NSAID use Follow-up with GI as needed  Follow Up Instructions:   I discussed the assessment and treatment plan with the patient. The patient was provided an opportunity to ask questions and all were answered. The patient agreed with the plan and demonstrated an understanding of the  instructions.   The patient was advised to call back or seek an in-person evaluation if the symptoms worsen or if the condition fails to improve as anticipated.  I provided 10 minutes of non-face-to-face time during this encounter.   Cephas Darby, MD

## 2019-03-22 DIAGNOSIS — M150 Primary generalized (osteo)arthritis: Secondary | ICD-10-CM | POA: Diagnosis not present

## 2019-03-22 DIAGNOSIS — M545 Low back pain: Secondary | ICD-10-CM | POA: Diagnosis not present

## 2019-03-26 ENCOUNTER — Other Ambulatory Visit: Payer: Self-pay | Admitting: Gastroenterology

## 2019-04-11 ENCOUNTER — Other Ambulatory Visit: Payer: Self-pay

## 2019-04-11 DIAGNOSIS — E119 Type 2 diabetes mellitus without complications: Secondary | ICD-10-CM | POA: Diagnosis not present

## 2019-04-11 DIAGNOSIS — I10 Essential (primary) hypertension: Secondary | ICD-10-CM | POA: Insufficient documentation

## 2019-04-11 DIAGNOSIS — Z79899 Other long term (current) drug therapy: Secondary | ICD-10-CM | POA: Insufficient documentation

## 2019-04-11 DIAGNOSIS — R110 Nausea: Secondary | ICD-10-CM | POA: Diagnosis not present

## 2019-04-11 DIAGNOSIS — Z87891 Personal history of nicotine dependence: Secondary | ICD-10-CM | POA: Diagnosis not present

## 2019-04-11 DIAGNOSIS — Z7984 Long term (current) use of oral hypoglycemic drugs: Secondary | ICD-10-CM | POA: Insufficient documentation

## 2019-04-11 DIAGNOSIS — Z8673 Personal history of transient ischemic attack (TIA), and cerebral infarction without residual deficits: Secondary | ICD-10-CM | POA: Insufficient documentation

## 2019-04-11 DIAGNOSIS — R0902 Hypoxemia: Secondary | ICD-10-CM | POA: Diagnosis not present

## 2019-04-11 DIAGNOSIS — J449 Chronic obstructive pulmonary disease, unspecified: Secondary | ICD-10-CM | POA: Diagnosis not present

## 2019-04-11 DIAGNOSIS — R531 Weakness: Secondary | ICD-10-CM | POA: Diagnosis not present

## 2019-04-11 DIAGNOSIS — W19XXXA Unspecified fall, initial encounter: Secondary | ICD-10-CM | POA: Diagnosis not present

## 2019-04-11 LAB — CBC
HCT: 37.4 % (ref 36.0–46.0)
Hemoglobin: 12.4 g/dL (ref 12.0–15.0)
MCH: 29.3 pg (ref 26.0–34.0)
MCHC: 33.2 g/dL (ref 30.0–36.0)
MCV: 88.4 fL (ref 80.0–100.0)
Platelets: 130 10*3/uL — ABNORMAL LOW (ref 150–400)
RBC: 4.23 MIL/uL (ref 3.87–5.11)
RDW: 16.2 % — ABNORMAL HIGH (ref 11.5–15.5)
WBC: 4.9 10*3/uL (ref 4.0–10.5)
nRBC: 0 % (ref 0.0–0.2)

## 2019-04-11 MED ORDER — ONDANSETRON HCL 4 MG/2ML IJ SOLN
INTRAMUSCULAR | Status: AC
  Filled 2019-04-11: qty 2

## 2019-04-11 MED ORDER — SODIUM CHLORIDE 0.9 % IV BOLUS
1000.0000 mL | Freq: Once | INTRAVENOUS | Status: AC
  Administered 2019-04-11: 1000 mL via INTRAVENOUS

## 2019-04-11 MED ORDER — ONDANSETRON HCL 4 MG/2ML IJ SOLN
4.0000 mg | Freq: Once | INTRAMUSCULAR | Status: AC | PRN
  Administered 2019-04-11: 4 mg via INTRAVENOUS

## 2019-04-11 NOTE — ED Triage Notes (Signed)
Pt arrives via [facility] from [place] w cc of nausea. Pt began dry heaving once in triage and vomited a small amount of green bile once. Pt states she started feeling sick around 10pm tonight.

## 2019-04-12 ENCOUNTER — Emergency Department
Admission: EM | Admit: 2019-04-12 | Discharge: 2019-04-12 | Disposition: A | Discharge status: Home / Self Care | Payer: Medicare HMO | Attending: Emergency Medicine | Admitting: Emergency Medicine

## 2019-04-12 DIAGNOSIS — R110 Nausea: Secondary | ICD-10-CM | POA: Diagnosis not present

## 2019-04-12 DIAGNOSIS — I10 Essential (primary) hypertension: Secondary | ICD-10-CM | POA: Diagnosis not present

## 2019-04-12 LAB — COMPREHENSIVE METABOLIC PANEL
ALT: 17 U/L (ref 0–44)
AST: 31 U/L (ref 15–41)
Albumin: 3.7 g/dL (ref 3.5–5.0)
Alkaline Phosphatase: 71 U/L (ref 38–126)
Anion gap: 12 (ref 5–15)
BUN: 17 mg/dL (ref 8–23)
CO2: 20 mmol/L — ABNORMAL LOW (ref 22–32)
Calcium: 9.2 mg/dL (ref 8.9–10.3)
Chloride: 107 mmol/L (ref 98–111)
Creatinine, Ser: 1.29 mg/dL — ABNORMAL HIGH (ref 0.44–1.00)
GFR calc Af Amer: 43 mL/min — ABNORMAL LOW (ref >60)
GFR calc non Af Amer: 37 mL/min — ABNORMAL LOW (ref >60)
Glucose, Bld: 158 mg/dL — ABNORMAL HIGH (ref 70–99)
Potassium: 4.3 mmol/L (ref 3.5–5.1)
Sodium: 139 mmol/L (ref 135–145)
Total Bilirubin: 0.9 mg/dL (ref 0.3–1.2)
Total Protein: 7 g/dL (ref 6.5–8.1)

## 2019-04-12 LAB — LIPASE, BLOOD: Lipase: 37 U/L (ref 11–51)

## 2019-04-12 MED ORDER — ONDANSETRON 4 MG PO TBDP
ORAL_TABLET | ORAL | Status: AC
  Administered 2019-04-12: 08:00:00 4 mg via ORAL
  Filled 2019-04-12: qty 1

## 2019-04-12 MED ORDER — ONDANSETRON HCL 4 MG/2ML IJ SOLN
4.0000 mg | Freq: Once | INTRAMUSCULAR | Status: AC
  Administered 2019-04-12: 08:00:00 4 mg via INTRAVENOUS
  Filled 2019-04-12: qty 2

## 2019-04-12 MED ORDER — ONDANSETRON 4 MG PO TBDP: 4.0000 mg | ORAL_TABLET | Freq: Once | ORAL | Status: AC

## 2019-04-12 NOTE — Discharge Instructions (Addendum)
Return to the ER for new, worsening, or persistent nausea, vomiting, abdominal pain, blood in the stool, fever, weakness, or any other new or worsening symptoms that concern you.

## 2019-04-12 NOTE — ED Notes (Signed)
Taking sips of g'ale at presents   Tolerating well

## 2019-04-12 NOTE — ED Notes (Signed)
See triage note  Presents with nausea and vomiting  States she developed sxs' around 10 pm last night  States on arrival she was having dry heaves  Denies any fever  Was given zofran on arrival   States now the nausea is returning   Low grade fever on arrival

## 2019-04-12 NOTE — ED Notes (Signed)
Patient stating she wants to leave.  This RN spoke with patient and given update and encouraged to stay.  Patient has agreed to stay and be seen by provider.

## 2019-04-12 NOTE — ED Provider Notes (Signed)
[facility] Emergency Department Provider Note ____________________________________________   First MD Initiated Contact with Patient 04/12/19 [phone]     (approximate)  I have reviewed the triage vital signs and the nursing notes.   HISTORY  Chief Complaint Nausea    HPI Alexa Lowe is a 86 y.o. female with PMH as noted below including peptic ulcer disease who presents with nausea since last night, associated with dry heaving but no significant actual vomiting, as well as some diarrhea previously.  The patient denies any abdominal pain and has had no blood in her stool.  She states that the diarrhea completely resolved.  The nausea had resolved while she was waiting to be seen and now is back slightly, although the patient also feels hungry.  She states that she otherwise has been feeling well.  She has no chest pain or shortness of breath, no cough or fever, no urinary symptoms.  Past Medical History:  Diagnosis Date  . Cancer [facility])    Right breast  . COPD (chronic obstructive pulmonary disease) ([place])   . Diabetes mellitus without complication ([place])   . Hypertension     Patient Active Problem List   Diagnosis Date Noted  . Chronic gastric ulcer without hemorrhage and without perforation   . Encephalopathy 08/25/2018  . TIA (transient ischemic attack) 08/24/2018  . Symptomatic anemia 05/19/2018  . HTN (hypertension) 05/19/2018  . Diabetes ([place]) 05/19/2018  . COPD (chronic obstructive pulmonary disease) ([place]) 05/19/2018    Past Surgical History:  Procedure Laterality Date  . ABDOMINAL SURGERY    . APPENDECTOMY    . BACK SURGERY    . BREAST SURGERY    . CHOLECYSTECTOMY    . ESOPHAGOGASTRODUODENOSCOPY N/A 08/31/2018   Procedure: ESOPHAGOGASTRODUODENOSCOPY (EGD);  Surgeon: Lin Landsman, MD;  Location: [facility] ENDOSCOPY;  Service: Gastroenterology;  Laterality: N/A;  . ESOPHAGOGASTRODUODENOSCOPY (EGD) WITH PROPOFOL N/A 05/20/2018   Procedure:  ESOPHAGOGASTRODUODENOSCOPY (EGD) WITH PROPOFOL;  Surgeon: Toledo, Benay Pike, MD;  Location: [facility] ENDOSCOPY;  Service: Gastroenterology;  Laterality: N/A;  . ESOPHAGOGASTRODUODENOSCOPY (EGD) WITH PROPOFOL N/A 12/17/2018   Procedure: ESOPHAGOGASTRODUODENOSCOPY (EGD) WITH PROPOFOL;  Surgeon: Lin Landsman, MD;  Location: [facility] ENDOSCOPY;  Service: Gastroenterology;  Laterality: N/A;    Prior to Admission medications   Medication Sig Start Date End Date Taking? Authorizing Provider  acetaminophen (TYLENOL) 500 MG tablet Take 1,000 mg by mouth every 8 (eight) hours as needed for mild pain or moderate pain.    [provider]  albuterol (PROVENTIL HFA;VENTOLIN HFA) 108 (90 Base) MCG/ACT inhaler Inhale 2 puffs into the lungs every 4 (four) hours as needed for wheezing or shortness of breath. 02/26/17   [provider]  allopurinol (ZYLOPRIM) 300 MG tablet  10/29/18   [provider]  atorvastatin (LIPITOR) 40 MG tablet Take 40 mg by mouth at bedtime.     [provider]  Cholecalciferol (VITAMIN D-1000 MAX ST) 25 MCG (1000 UT) tablet Take by mouth.    [provider]  citalopram (CELEXA) 20 MG tablet Take 30 mg by mouth daily.     [provider]  gabapentin (NEURONTIN) 100 MG capsule Take 1 capsule (100 mg total) by mouth 3 (three) times daily for 30 days. 09/02/18 03/08/19  Salary, Avel Peace, MD  losartan (COZAAR) 50 MG tablet  11/28/18   [provider]  Melatonin 5 MG TABS Take 10 mg by mouth at bedtime.    [provider]  metFORMIN (GLUCOPHAGE) 500 MG tablet  10/29/18   [provider]  mupirocin ointment (BACTROBAN) 2 % APPLY TO AFFECTED AREA 3 TIMES A DAY 09/22/18   [provider]  nystatin ointment (MYCOSTATIN) Apply 1 application topically 2 (two) times a day. 08/19/18   [provider]  omeprazole (PRILOSEC) 40 MG capsule TAKE 1 CAPSULE (40 MG TOTAL) BY MOUTH DAILY BEFORE BREAKFAST FOR 30 DOSES.  03/29/19 04/28/19  Lin Landsman, MD  sucralfate (CARAFATE) 1 g tablet Take 1 g by mouth 4 (four) times daily. 07/17/18   [provider]  traMADol (ULTRAM) 50 MG tablet TAKE 1 TABLET (50 MG TOTAL) BY MOUTH EVERY 6 (SIX) HOURS AS NEEDED FOR PAIN 11/03/18   [provider]  vitamin B-12 (CYANOCOBALAMIN) 1000 MCG tablet Take by mouth.    [provider]    Allergies Morphine and related, Ace inhibitors, Doxycycline, Erythromycin ethylsuccinate, and Codeine  No family history on file.  Social History Social History   Tobacco Use  . Smoking status: Former Research scientist (life sciences)  . Smokeless tobacco: Never Used  Substance Use Topics  . Alcohol use: No  . Drug use: No    Review of Systems  Constitutional: No fever/chills. Eyes: No redness. ENT: No sore throat. Cardiovascular: Denies chest pain. Respiratory: Denies shortness of breath. Gastrointestinal: Positive for nausea and resolved diarrhea. Genitourinary: Negative for dysuria.  Musculoskeletal: Negative for back pain. Skin: Negative for rash. Neurological: Negative for headache.   ____________________________________________   PHYSICAL EXAM:  VITAL SIGNS: ED Triage Vitals  Enc Vitals Group     BP 04/11/19 2325 (!) 136/46     Pulse Rate 04/11/19 2325 61     Resp 04/11/19 2325 18     Temp 04/11/19 2325 99.3 F (37.4 C)     Temp Source 04/11/19 2325 Oral     SpO2 04/11/19 2325 91 %     Weight 04/11/19 2326 198 lb (89.8 kg)     Height 04/11/19 2326 5\' 2"  (1.575 m)     Head Circumference --      Peak Flow --      Pain Score 04/11/19 2326 0     Pain Loc --      Pain Edu? --      Excl. in [place]? --     Constitutional: Alert and oriented. Well appearing for age and in no acute distress. Eyes: Conjunctivae are normal.  No scleral icterus. Head: Atraumatic. Nose: No congestion/rhinnorhea. Mouth/Throat: Mucous membranes are moist.   Neck: Normal range of motion.  Cardiovascular: Normal rate, regular  rhythm.  Good peripheral circulation. Respiratory: Normal respiratory effort.  No retractions.  Gastrointestinal: Soft and nontender. No distention.  Genitourinary: No flank tenderness. Musculoskeletal: Extremities warm and well perfused.  Neurologic:  Normal speech and language. No gross focal neurologic deficits are appreciated.  Skin:  Skin is warm and dry. No rash noted. Psychiatric: Mood and affect are normal. Speech and behavior are normal.  ____________________________________________   LABS (all labs ordered are listed, but only abnormal results are displayed)  Labs Reviewed  COMPREHENSIVE METABOLIC PANEL - Abnormal; Notable for the following components:      Result Value   CO2 20 (*)    Glucose, Bld 158 (*)    Creatinine, Ser 1.29 (*)    GFR calc non Af Amer 37 (*)    GFR calc Af Amer 43 (*)    All other components within normal limits  CBC - Abnormal; Notable for the following components:   RDW 16.2 (*)  Platelets 130 (*)    All other components within normal limits  LIPASE, BLOOD  URINALYSIS, COMPLETE (UACMP) WITH MICROSCOPIC   ____________________________________________  EKG  ED ECG REPORT I, Arta Silence, the attending physician, personally viewed and interpreted this ECG.  Date: 04/12/2019 EKG Time: 08 32 Rate: 65 Rhythm: normal sinus rhythm (incorrectly read by computer as junctional rhythm) QRS Axis: Left axis Intervals: normal ST/T Wave abnormalities: normal Narrative Interpretation: no evidence of acute ischemia; no significant change when compared EKG 08/23/2018  ____________________________________________  RADIOLOGY    ____________________________________________   PROCEDURES  Procedure(s) performed: No  Procedures  Critical Care performed: No ____________________________________________   INITIAL IMPRESSION / ASSESSMENT AND PLAN / ED COURSE  Pertinent labs & imaging results that were available during my care of the  patient were reviewed by me and considered in my medical decision making (see chart for details).  86 year old female with PMH as noted above presents with nausea and dry heaving as well as resolved diarrhea.  She denies any associated abdominal pain.  She states that she is feeling somewhat better after waiting to be evaluated for more than 8 hours.  I reviewed the past medical records in [place].  The patient follows with Dr. Marius Ditch from gastroenterology for PUD and was last seen in November.  She had an EGD in September showing a nonbleeding gastric ulcer.    On exam today, the patient is well-appearing for her age.  Her vital signs are normal except for a borderline elevated temperature.  She appears well-hydrated, and her abdomen is soft and nontender.  The rest of the exam is unremarkable.  Overall presentation is most consistent with acute gastroenteritis, foodborne illness, or other relatively benign cause.  Differential also includes PUD or gastritis, although the patient has no evidence of bleeding.  The patient reports that while she waited the diarrhea resolved completely, and the nausea mostly resolved.  She states that she feels like she wants to eat.  Lab work-up is unremarkable.  At this time, given the patient's improving symptoms, well appearance, and reassuring exam, there is no indication for imaging or further emergent work-up.  I will give additional p.o. Zofran, attempt a p.o. trial, and reassess.  ----------------------------------------- 9:39 AM on 04/12/2019 -----------------------------------------  The patient states she is feeling better.  She is tolerating p.o., and has had no vomiting.  She is stable for discharge at this time.  Return precautions given, and she expresses understanding.  ____________________________________________   FINAL CLINICAL IMPRESSION(S) / ED DIAGNOSES  Final diagnoses:  Nausea      NEW MEDICATIONS STARTED DURING THIS VISIT:  New  Prescriptions   No medications on file     Note:  This document was prepared using Dragon voice recognition software and may include unintentional dictation errors.    Arta Silence, MD 04/12/19 [phone]

## 2019-04-22 DIAGNOSIS — M545 Low back pain: Secondary | ICD-10-CM | POA: Diagnosis not present

## 2019-04-22 DIAGNOSIS — M150 Primary generalized (osteo)arthritis: Secondary | ICD-10-CM | POA: Diagnosis not present

## 2019-05-12 DIAGNOSIS — G8929 Other chronic pain: Secondary | ICD-10-CM | POA: Diagnosis not present

## 2019-05-12 DIAGNOSIS — E785 Hyperlipidemia, unspecified: Secondary | ICD-10-CM | POA: Diagnosis not present

## 2019-05-12 DIAGNOSIS — M25511 Pain in right shoulder: Secondary | ICD-10-CM | POA: Diagnosis not present

## 2019-05-12 DIAGNOSIS — E1169 Type 2 diabetes mellitus with other specified complication: Secondary | ICD-10-CM | POA: Diagnosis not present

## 2019-05-12 DIAGNOSIS — M19011 Primary osteoarthritis, right shoulder: Secondary | ICD-10-CM | POA: Diagnosis not present

## 2019-05-17 DIAGNOSIS — J449 Chronic obstructive pulmonary disease, unspecified: Secondary | ICD-10-CM | POA: Diagnosis not present

## 2019-05-17 DIAGNOSIS — E1169 Type 2 diabetes mellitus with other specified complication: Secondary | ICD-10-CM | POA: Diagnosis not present

## 2019-05-17 DIAGNOSIS — I10 Essential (primary) hypertension: Secondary | ICD-10-CM | POA: Diagnosis not present

## 2019-05-17 DIAGNOSIS — Z6837 Body mass index (BMI) 37.0-37.9, adult: Secondary | ICD-10-CM | POA: Diagnosis not present

## 2019-05-17 DIAGNOSIS — G459 Transient cerebral ischemic attack, unspecified: Secondary | ICD-10-CM | POA: Diagnosis not present

## 2019-05-17 DIAGNOSIS — E785 Hyperlipidemia, unspecified: Secondary | ICD-10-CM | POA: Diagnosis not present

## 2019-06-01 DIAGNOSIS — E7800 Pure hypercholesterolemia, unspecified: Secondary | ICD-10-CM | POA: Diagnosis not present

## 2019-06-01 DIAGNOSIS — E1169 Type 2 diabetes mellitus with other specified complication: Secondary | ICD-10-CM | POA: Diagnosis not present

## 2019-06-01 DIAGNOSIS — I10 Essential (primary) hypertension: Secondary | ICD-10-CM | POA: Diagnosis not present

## 2019-06-01 DIAGNOSIS — E785 Hyperlipidemia, unspecified: Secondary | ICD-10-CM | POA: Diagnosis not present

## 2019-06-08 DIAGNOSIS — E1122 Type 2 diabetes mellitus with diabetic chronic kidney disease: Secondary | ICD-10-CM | POA: Diagnosis not present

## 2019-06-08 DIAGNOSIS — E1169 Type 2 diabetes mellitus with other specified complication: Secondary | ICD-10-CM | POA: Diagnosis not present

## 2019-06-08 DIAGNOSIS — G8929 Other chronic pain: Secondary | ICD-10-CM | POA: Diagnosis not present

## 2019-06-08 DIAGNOSIS — I129 Hypertensive chronic kidney disease with stage 1 through stage 4 chronic kidney disease, or unspecified chronic kidney disease: Secondary | ICD-10-CM | POA: Diagnosis not present

## 2019-06-08 DIAGNOSIS — E7800 Pure hypercholesterolemia, unspecified: Secondary | ICD-10-CM | POA: Diagnosis not present

## 2019-06-08 DIAGNOSIS — M5441 Lumbago with sciatica, right side: Secondary | ICD-10-CM | POA: Diagnosis not present

## 2019-06-08 DIAGNOSIS — N1831 Chronic kidney disease, stage 3a: Secondary | ICD-10-CM | POA: Diagnosis not present

## 2019-06-16 ENCOUNTER — Emergency Department
Admission: EM | Admit: 2019-06-16 | Discharge: 2019-06-16 | Disposition: A | Discharge status: Home / Self Care | Payer: Medicare HMO | Attending: Emergency Medicine | Admitting: Emergency Medicine

## 2019-06-16 ENCOUNTER — Other Ambulatory Visit: Payer: Self-pay

## 2019-06-16 ENCOUNTER — Emergency Department: Payer: Medicare HMO

## 2019-06-16 ENCOUNTER — Encounter: Payer: Self-pay | Admitting: Emergency Medicine

## 2019-06-16 DIAGNOSIS — S99921A Unspecified injury of right foot, initial encounter: Secondary | ICD-10-CM | POA: Diagnosis not present

## 2019-06-16 DIAGNOSIS — Z7984 Long term (current) use of oral hypoglycemic drugs: Secondary | ICD-10-CM | POA: Insufficient documentation

## 2019-06-16 DIAGNOSIS — E119 Type 2 diabetes mellitus without complications: Secondary | ICD-10-CM | POA: Insufficient documentation

## 2019-06-16 DIAGNOSIS — R0789 Other chest pain: Secondary | ICD-10-CM | POA: Insufficient documentation

## 2019-06-16 DIAGNOSIS — Z9049 Acquired absence of other specified parts of digestive tract: Secondary | ICD-10-CM | POA: Insufficient documentation

## 2019-06-16 DIAGNOSIS — Z7982 Long term (current) use of aspirin: Secondary | ICD-10-CM | POA: Insufficient documentation

## 2019-06-16 DIAGNOSIS — J449 Chronic obstructive pulmonary disease, unspecified: Secondary | ICD-10-CM | POA: Insufficient documentation

## 2019-06-16 DIAGNOSIS — Z8673 Personal history of transient ischemic attack (TIA), and cerebral infarction without residual deficits: Secondary | ICD-10-CM | POA: Insufficient documentation

## 2019-06-16 DIAGNOSIS — R079 Chest pain, unspecified: Secondary | ICD-10-CM | POA: Diagnosis not present

## 2019-06-16 DIAGNOSIS — Z79899 Other long term (current) drug therapy: Secondary | ICD-10-CM | POA: Diagnosis not present

## 2019-06-16 DIAGNOSIS — Z87891 Personal history of nicotine dependence: Secondary | ICD-10-CM | POA: Insufficient documentation

## 2019-06-16 DIAGNOSIS — Z853 Personal history of malignant neoplasm of breast: Secondary | ICD-10-CM | POA: Insufficient documentation

## 2019-06-16 DIAGNOSIS — R609 Edema, unspecified: Secondary | ICD-10-CM | POA: Diagnosis not present

## 2019-06-16 DIAGNOSIS — R202 Paresthesia of skin: Secondary | ICD-10-CM | POA: Diagnosis not present

## 2019-06-16 DIAGNOSIS — I10 Essential (primary) hypertension: Secondary | ICD-10-CM | POA: Insufficient documentation

## 2019-06-16 LAB — COMPREHENSIVE METABOLIC PANEL
ALT: 16 U/L (ref 0–44)
AST: 24 U/L (ref 15–41)
Albumin: 3.6 g/dL (ref 3.5–5.0)
Alkaline Phosphatase: 67 U/L (ref 38–126)
Anion gap: 9 (ref 5–15)
BUN: 22 mg/dL (ref 8–23)
CO2: 24 mmol/L (ref 22–32)
Calcium: 9.5 mg/dL (ref 8.9–10.3)
Chloride: 108 mmol/L (ref 98–111)
Creatinine, Ser: 1.07 mg/dL — ABNORMAL HIGH (ref 0.44–1.00)
GFR calc Af Amer: 54 mL/min — ABNORMAL LOW (ref >60)
GFR calc non Af Amer: 47 mL/min — ABNORMAL LOW (ref >60)
Glucose, Bld: 107 mg/dL — ABNORMAL HIGH (ref 70–99)
Potassium: 4.1 mmol/L (ref 3.5–5.1)
Sodium: 141 mmol/L (ref 135–145)
Total Bilirubin: 0.8 mg/dL (ref 0.3–1.2)
Total Protein: 6.7 g/dL (ref 6.5–8.1)

## 2019-06-16 LAB — CBC
HCT: 38.7 % (ref 36.0–46.0)
Hemoglobin: 12.1 g/dL (ref 12.0–15.0)
MCH: 30 pg (ref 26.0–34.0)
MCHC: 31.3 g/dL (ref 30.0–36.0)
MCV: 95.8 fL (ref 80.0–100.0)
Platelets: 212 10*3/uL (ref 150–400)
RBC: 4.04 MIL/uL (ref 3.87–5.11)
RDW: 15.9 % — ABNORMAL HIGH (ref 11.5–15.5)
WBC: 8.7 10*3/uL (ref 4.0–10.5)
nRBC: 0 % (ref 0.0–0.2)

## 2019-06-16 LAB — TROPONIN I (HIGH SENSITIVITY)
Troponin I (High Sensitivity): 5 ng/L (ref <18)
Troponin I (High Sensitivity): 6 ng/L (ref <18)

## 2019-06-16 NOTE — ED Triage Notes (Signed)
Pt arrival via [facility] from [facility] independent living due to chest pain. EMS states that she began having chest pain this afternoon that is in the center of her chest. Pt describes this pain as sharp and initially was a 7/10 but with EMS was a 4/10. Pt denies pain at this moment, but states her left arm feels 'funny'.   Pt also states that she had a fall a few days ago and that she hurt her right foot. Pt states her right foot and ankle has been swollen since then and wants it checked out as well.  Pt took 4 baby asa before EMS arrival.

## 2019-06-16 NOTE — ED Provider Notes (Signed)
[facility] Emergency Department Provider Note   ____________________________________________    I have reviewed the triage vital signs and the nursing notes.   HISTORY  Chief Complaint Chest Pain     HPI Alexa Lowe is a 86 y.o. female with history of COPD, diabetes, hypertension who presents with complaints of chest pain.  Patient reports central chest pain which was initially sharp and moderate to severe quickly improved, started this afternoon around 4 and is now much better in the emergency department.  She also reports that she had a fall on Monday and injured her right foot and ankle and would like that examined as well.  Denies shortness of breath.  No fevers chills or cough.  No pleurisy.  No nausea or vomiting  Past Medical History:  Diagnosis Date  . Cancer [facility])    Right breast  . COPD (chronic obstructive pulmonary disease) ([place])   . Diabetes mellitus without complication ([place])   . Hypertension     Patient Active Problem List   Diagnosis Date Noted  . Chronic gastric ulcer without hemorrhage and without perforation   . Encephalopathy 08/25/2018  . TIA (transient ischemic attack) 08/24/2018  . Symptomatic anemia 05/19/2018  . HTN (hypertension) 05/19/2018  . Diabetes ([place]) 05/19/2018  . COPD (chronic obstructive pulmonary disease) ([place]) 05/19/2018    Past Surgical History:  Procedure Laterality Date  . ABDOMINAL SURGERY    . APPENDECTOMY    . BACK SURGERY    . BREAST SURGERY    . CHOLECYSTECTOMY    . ESOPHAGOGASTRODUODENOSCOPY N/A 08/31/2018   Procedure: ESOPHAGOGASTRODUODENOSCOPY (EGD);  Surgeon: Lin Landsman, MD;  Location: [facility] ENDOSCOPY;  Service: Gastroenterology;  Laterality: N/A;  . ESOPHAGOGASTRODUODENOSCOPY (EGD) WITH PROPOFOL N/A 05/20/2018   Procedure: ESOPHAGOGASTRODUODENOSCOPY (EGD) WITH PROPOFOL;  Surgeon: Toledo, Benay Pike, MD;  Location: [facility] ENDOSCOPY;  Service: Gastroenterology;  Laterality: N/A;  .  ESOPHAGOGASTRODUODENOSCOPY (EGD) WITH PROPOFOL N/A 12/17/2018   Procedure: ESOPHAGOGASTRODUODENOSCOPY (EGD) WITH PROPOFOL;  Surgeon: Lin Landsman, MD;  Location: [facility] ENDOSCOPY;  Service: Gastroenterology;  Laterality: N/A;    Prior to Admission medications   Medication Sig Start Date End Date Taking? Authorizing Provider  allopurinol (ZYLOPRIM) 300 MG tablet Take 300 mg by mouth daily.  10/29/18  Yes [provider]  aspirin 325 MG tablet Take 325 mg by mouth daily.   Yes [provider]  citalopram (CELEXA) 20 MG tablet Take 20 mg by mouth daily.    Yes [provider]  losartan (COZAAR) 50 MG tablet Take 50 mg by mouth daily.  11/28/18  Yes [provider]  metFORMIN (GLUCOPHAGE) 500 MG tablet Take 500 mg by mouth in the morning and at bedtime.  10/29/18  Yes [provider]  pantoprazole (PROTONIX) 40 MG tablet Take 40 mg by mouth daily.   Yes [provider]  pregabalin (LYRICA) 50 MG capsule Take 50 mg by mouth 2 (two) times daily.   Yes [provider]  ranitidine (ZANTAC) 150 MG tablet Take 150 mg by mouth daily.   Yes [provider]  traMADol (ULTRAM) 50 MG tablet Take 100 mg by mouth at bedtime. Can also take every four hour if needed 11/03/18  Yes [provider]     Allergies Morphine and related, Ace inhibitors, Doxycycline, Erythromycin ethylsuccinate, and Codeine  History reviewed. No pertinent family history.  Social History Social History   Tobacco Use  . Smoking status: Former Research scientist (life sciences)  . Smokeless tobacco: Never Used  Substance Use Topics  . Alcohol use: No  . Drug use: No    Review of Systems  Constitutional: No fever/chills Eyes: No visual changes.  ENT: No sore throat. Cardiovascular: As above Respiratory: Denies shortness of breath. Gastrointestinal: No abdominal pain.  No nausea, no vomiting.   Genitourinary: Negative for dysuria. Musculoskeletal: As above Skin:  Negative for rash. Neurological: Negative for headaches or weakness   ____________________________________________   PHYSICAL EXAM:  VITAL SIGNS: ED Triage Vitals  Enc Vitals Group     BP 06/16/19 1730 (!) 142/72     Pulse Rate 06/16/19 1730 (!) 117     Resp 06/16/19 1730 14     Temp 06/16/19 1732 98 F (36.7 C)     Temp Source 06/16/19 1732 Oral     SpO2 06/16/19 1730 97 %     Weight 06/16/19 1720 89.8 kg (198 lb)     Height 06/16/19 1720 1.575 m (5\' 2" )     Head Circumference --      Peak Flow --      Pain Score 06/16/19 1719 2     Pain Loc --      Pain Edu? --      Excl. in [place]? --     Constitutional: Alert and oriented.   Nose: No congestion/rhinnorhea. Mouth/Throat: Mucous membranes are moist.    Cardiovascular: Normal rate, regular rhythm. Grossly normal heart sounds.  Good peripheral circulation. Respiratory: Normal respiratory effort.  No retractions. Lungs CTAB. Gastrointestinal: Soft and nontender. No distention.  No CVA tenderness. Genitourinary: deferred Musculoskeletal: Bruising to the right lateral ankle and dorsal aspect of the midfoot, no bony normalities palpated Neurologic:  Normal speech and language. No gross focal neurologic deficits are appreciated.  Skin:  Skin is warm, dry and intact. No rash noted. Psychiatric: Mood and affect are normal. Speech and behavior are normal.  ____________________________________________   LABS (all labs ordered are listed, but only abnormal results are displayed)  Labs Reviewed  CBC - Abnormal; Notable for the following components:      Result Value   RDW 15.9 (*)    All other components within normal limits  COMPREHENSIVE METABOLIC PANEL - Abnormal; Notable for the following components:   Glucose, Bld 107 (*)    Creatinine, Ser 1.07 (*)    GFR calc non Af Amer 47 (*)    GFR calc Af Amer 54 (*)    All other components within normal limits  TROPONIN I (HIGH SENSITIVITY)  TROPONIN I (HIGH SENSITIVITY)    ____________________________________________  EKG  ED ECG REPORT I, Lavonia Drafts, the attending physician, personally viewed and interpreted this ECG.  Date: 06/16/2019  Rhythm: normal sinus rhythm QRS Axis: normal Intervals: normal ST/T Wave abnormalities: Nonspecific changes Narrative Interpretation: no evidence of acute ischemia  ____________________________________________  RADIOLOGY  Chest x-ray unremarkable, viewed by me Foot x-ray no acute fracture ____________________________________________   PROCEDURES  Procedure(s) performed: No  Procedures   Critical Care performed: No ____________________________________________   INITIAL IMPRESSION / ASSESSMENT AND PLAN / ED COURSE  Pertinent labs & imaging results that were available during my care of the patient were reviewed by me and considered in my medical decision making (see chart for details).  Patient overall well-appearing in no acute distress.  Reports chest pain is significantly improved.  Initial EKG is reassuring, initial troponin of 5, pending second troponin.  Chemistries otherwise unremarkable, white blood cell count and hemoglobin normal.  Patient second troponin within normal limits.  She  is feeling well and is asymptomatic.  Appropriate for discharge at this time with outpatient follow-up, strict return precautions discussed    ____________________________________________   FINAL CLINICAL IMPRESSION(S) / ED DIAGNOSES  Final diagnoses:  Atypical chest pain        Note:  This document was prepared using Dragon voice recognition software and may include unintentional dictation errors.   Lavonia Drafts, MD 06/16/19 2223

## 2019-06-18 DIAGNOSIS — R300 Dysuria: Secondary | ICD-10-CM | POA: Diagnosis not present

## 2019-06-18 DIAGNOSIS — N390 Urinary tract infection, site not specified: Secondary | ICD-10-CM | POA: Diagnosis not present

## 2019-06-18 DIAGNOSIS — B379 Candidiasis, unspecified: Secondary | ICD-10-CM | POA: Diagnosis not present

## 2019-07-19 DIAGNOSIS — L821 Other seborrheic keratosis: Secondary | ICD-10-CM | POA: Diagnosis not present

## 2019-07-19 DIAGNOSIS — L570 Actinic keratosis: Secondary | ICD-10-CM | POA: Diagnosis not present

## 2019-07-19 DIAGNOSIS — D3611 Benign neoplasm of peripheral nerves and autonomic nervous system of face, head, and neck: Secondary | ICD-10-CM | POA: Diagnosis not present

## 2019-07-19 DIAGNOSIS — D485 Neoplasm of uncertain behavior of skin: Secondary | ICD-10-CM | POA: Diagnosis not present

## 2019-07-19 DIAGNOSIS — X32XXXA Exposure to sunlight, initial encounter: Secondary | ICD-10-CM | POA: Diagnosis not present

## 2019-08-03 DIAGNOSIS — E1169 Type 2 diabetes mellitus with other specified complication: Secondary | ICD-10-CM | POA: Diagnosis not present

## 2019-08-03 DIAGNOSIS — E785 Hyperlipidemia, unspecified: Secondary | ICD-10-CM | POA: Diagnosis not present

## 2019-08-03 DIAGNOSIS — E669 Obesity, unspecified: Secondary | ICD-10-CM | POA: Diagnosis not present

## 2019-08-03 DIAGNOSIS — G8929 Other chronic pain: Secondary | ICD-10-CM | POA: Diagnosis not present

## 2019-08-03 DIAGNOSIS — M19011 Primary osteoarthritis, right shoulder: Secondary | ICD-10-CM | POA: Diagnosis not present

## 2019-08-03 DIAGNOSIS — M25511 Pain in right shoulder: Secondary | ICD-10-CM | POA: Diagnosis not present

## 2019-09-05 ENCOUNTER — Encounter: Payer: Self-pay | Admitting: *Deleted

## 2019-09-05 ENCOUNTER — Emergency Department: Payer: Medicare HMO

## 2019-09-05 ENCOUNTER — Emergency Department
Admission: EM | Admit: 2019-09-05 | Discharge: 2019-09-05 | Disposition: A | Discharge status: Home / Self Care | Payer: Medicare HMO | Attending: Emergency Medicine | Admitting: Emergency Medicine

## 2019-09-05 DIAGNOSIS — R609 Edema, unspecified: Secondary | ICD-10-CM | POA: Diagnosis not present

## 2019-09-05 DIAGNOSIS — R262 Difficulty in walking, not elsewhere classified: Secondary | ICD-10-CM | POA: Diagnosis not present

## 2019-09-05 DIAGNOSIS — Y92481 Parking lot as the place of occurrence of the external cause: Secondary | ICD-10-CM | POA: Insufficient documentation

## 2019-09-05 DIAGNOSIS — W19XXXA Unspecified fall, initial encounter: Secondary | ICD-10-CM

## 2019-09-05 DIAGNOSIS — W010XXA Fall on same level from slipping, tripping and stumbling without subsequent striking against object, initial encounter: Secondary | ICD-10-CM | POA: Diagnosis not present

## 2019-09-05 DIAGNOSIS — R519 Headache, unspecified: Secondary | ICD-10-CM | POA: Diagnosis present

## 2019-09-05 DIAGNOSIS — T1490XA Injury, unspecified, initial encounter: Secondary | ICD-10-CM

## 2019-09-05 DIAGNOSIS — S199XXA Unspecified injury of neck, initial encounter: Secondary | ICD-10-CM | POA: Diagnosis not present

## 2019-09-05 DIAGNOSIS — S3993XA Unspecified injury of pelvis, initial encounter: Secondary | ICD-10-CM | POA: Diagnosis not present

## 2019-09-05 DIAGNOSIS — I10 Essential (primary) hypertension: Secondary | ICD-10-CM | POA: Diagnosis not present

## 2019-09-05 DIAGNOSIS — Z87891 Personal history of nicotine dependence: Secondary | ICD-10-CM | POA: Insufficient documentation

## 2019-09-05 DIAGNOSIS — S93402A Sprain of unspecified ligament of left ankle, initial encounter: Secondary | ICD-10-CM | POA: Insufficient documentation

## 2019-09-05 DIAGNOSIS — E119 Type 2 diabetes mellitus without complications: Secondary | ICD-10-CM | POA: Diagnosis not present

## 2019-09-05 DIAGNOSIS — S0990XA Unspecified injury of head, initial encounter: Secondary | ICD-10-CM | POA: Diagnosis not present

## 2019-09-05 DIAGNOSIS — J449 Chronic obstructive pulmonary disease, unspecified: Secondary | ICD-10-CM | POA: Diagnosis not present

## 2019-09-05 DIAGNOSIS — R52 Pain, unspecified: Secondary | ICD-10-CM | POA: Diagnosis not present

## 2019-09-05 DIAGNOSIS — Y999 Unspecified external cause status: Secondary | ICD-10-CM | POA: Insufficient documentation

## 2019-09-05 DIAGNOSIS — S79922A Unspecified injury of left thigh, initial encounter: Secondary | ICD-10-CM | POA: Diagnosis not present

## 2019-09-05 DIAGNOSIS — Y9389 Activity, other specified: Secondary | ICD-10-CM | POA: Diagnosis not present

## 2019-09-05 DIAGNOSIS — S99912A Unspecified injury of left ankle, initial encounter: Secondary | ICD-10-CM | POA: Diagnosis not present

## 2019-09-05 DIAGNOSIS — Z79899 Other long term (current) drug therapy: Secondary | ICD-10-CM | POA: Insufficient documentation

## 2019-09-05 DIAGNOSIS — M7989 Other specified soft tissue disorders: Secondary | ICD-10-CM | POA: Diagnosis not present

## 2019-09-05 DIAGNOSIS — I959 Hypotension, unspecified: Secondary | ICD-10-CM | POA: Diagnosis not present

## 2019-09-05 DIAGNOSIS — S8992XA Unspecified injury of left lower leg, initial encounter: Secondary | ICD-10-CM | POA: Diagnosis not present

## 2019-09-05 DIAGNOSIS — R1900 Intra-abdominal and pelvic swelling, mass and lump, unspecified site: Secondary | ICD-10-CM | POA: Diagnosis not present

## 2019-09-05 MED ORDER — OXYCODONE-ACETAMINOPHEN 5-325 MG PO TABS
1.0000 | ORAL_TABLET | Freq: Once | ORAL | Status: AC
  Administered 2019-09-05: 1 via ORAL
  Filled 2019-09-05: qty 1

## 2019-09-05 MED ORDER — ONDANSETRON 4 MG PO TBDP
4.0000 mg | ORAL_TABLET | Freq: Once | ORAL | Status: AC
  Administered 2019-09-05: 4 mg via ORAL
  Filled 2019-09-05: qty 1

## 2019-09-05 NOTE — ED Triage Notes (Signed)
Pt lives independently at [facility].  Yesterday she slipped in a puddle and twisted her left ankle and struck her head.  No LOC.  Pt has swelling and bruising to her left ankle. Pt has has had a difficulty ambulating since. Pt is alert and oriented

## 2019-09-05 NOTE — ED Provider Notes (Signed)
[facility] Emergency Department Provider Note  ____________________________________________   First MD Initiated Contact with Patient 09/05/19 1347     (approximate)  I have reviewed the triage vital signs and the nursing notes.   HISTORY  Chief Complaint Fall    HPI Alexa Lowe is a 86 y.o. female presents emergency department after a fall yesterday.  Patient was at the grocery store and was getting into her car and fell backwards landing in a mud puddle.  She states that she put her head.  She is complaining of headache, left hip pain, left knee pain, and left ankle pain.  No nausea or vomiting.  She is not on any blood thinners.   Pain rated at 10/10.  Patient does live alone at [place].  She does not have any home health care.  She states she had applied for some help but they told her she had $7 too much per month.  She would like help as she is having more difficulty getting around.   Past Medical History:  Diagnosis Date  . Cancer [facility])    Right breast  . COPD (chronic obstructive pulmonary disease) ([place])   . Diabetes mellitus without complication ([place])   . Hypertension     Patient Active Problem List   Diagnosis Date Noted  . Chronic gastric ulcer without hemorrhage and without perforation   . Encephalopathy 08/25/2018  . TIA (transient ischemic attack) 08/24/2018  . Symptomatic anemia 05/19/2018  . HTN (hypertension) 05/19/2018  . Diabetes ([place]) 05/19/2018  . COPD (chronic obstructive pulmonary disease) ([place]) 05/19/2018    Past Surgical History:  Procedure Laterality Date  . ABDOMINAL SURGERY    . APPENDECTOMY    . BACK SURGERY    . BREAST SURGERY    . CHOLECYSTECTOMY    . ESOPHAGOGASTRODUODENOSCOPY N/A 08/31/2018   Procedure: ESOPHAGOGASTRODUODENOSCOPY (EGD);  Surgeon: Lin Landsman, MD;  Location: [facility] ENDOSCOPY;  Service: Gastroenterology;  Laterality: N/A;  . ESOPHAGOGASTRODUODENOSCOPY (EGD) WITH PROPOFOL N/A  05/20/2018   Procedure: ESOPHAGOGASTRODUODENOSCOPY (EGD) WITH PROPOFOL;  Surgeon: Toledo, Benay Pike, MD;  Location: [facility] ENDOSCOPY;  Service: Gastroenterology;  Laterality: N/A;  . ESOPHAGOGASTRODUODENOSCOPY (EGD) WITH PROPOFOL N/A 12/17/2018   Procedure: ESOPHAGOGASTRODUODENOSCOPY (EGD) WITH PROPOFOL;  Surgeon: Lin Landsman, MD;  Location: [facility] ENDOSCOPY;  Service: Gastroenterology;  Laterality: N/A;    Prior to Admission medications   Medication Sig Start Date End Date Taking? Authorizing Provider  allopurinol (ZYLOPRIM) 300 MG tablet Take 300 mg by mouth daily.  10/29/18   [provider]  aspirin 325 MG tablet Take 325 mg by mouth daily.    [provider]  citalopram (CELEXA) 20 MG tablet Take 20 mg by mouth daily.     [provider]  losartan (COZAAR) 50 MG tablet Take 50 mg by mouth daily.  11/28/18   [provider]  metFORMIN (GLUCOPHAGE) 500 MG tablet Take 500 mg by mouth in the morning and at bedtime.  10/29/18   [provider]  pantoprazole (PROTONIX) 40 MG tablet Take 40 mg by mouth daily.    [provider]  pregabalin (LYRICA) 50 MG capsule Take 50 mg by mouth 2 (two) times daily.    [provider]  ranitidine (ZANTAC) 150 MG tablet Take 150 mg by mouth daily.    [provider]  traMADol (ULTRAM) 50 MG tablet Take 100 mg by mouth at bedtime. Can also take every four hour if needed 11/03/18   [provider]    Allergies Morphine and related, Ace inhibitors, Doxycycline, Erythromycin ethylsuccinate, and Codeine  No family history on file.  Social History Social History   Tobacco Use  . Smoking status: Former Research scientist (life sciences)  . Smokeless tobacco: Never Used  Substance Use Topics  . Alcohol use: No  . Drug use: No    Review of Systems  Constitutional: No fever/chills Eyes: No visual changes. ENT: No sore throat. Respiratory: Denies cough Cardiovascular: Denies chest  pain Gastrointestinal: Denies abdominal pain Genitourinary: Negative for dysuria. Musculoskeletal: Negative for back pain.  Positive for left hip, knee and ankle pain Skin: Negative for rash. Psychiatric: no mood changes,     ____________________________________________   PHYSICAL EXAM:  VITAL SIGNS: ED Triage Vitals  Enc Vitals Group     BP 09/05/19 1344 136/76     Pulse Rate 09/05/19 1344 65     Resp 09/05/19 1344 16     Temp 09/05/19 1344 98.3 F (36.8 C)     Temp Source 09/05/19 1344 Oral     SpO2 09/05/19 1344 97 %     Weight 09/05/19 1345 198 lb (89.8 kg)     Height --      Head Circumference --      Peak Flow --      Pain Score 09/05/19 1345 10     Pain Loc --      Pain Edu? --      Excl. in [place]? --     Constitutional: Alert and oriented. Well appearing and in no acute distress. Eyes: Conjunctivae are normal.  Head: Atraumatic.  No swelling or bruising noted posteriorly Nose: No congestion/rhinnorhea. Mouth/Throat: Mucous membranes are moist.   Neck:  supple no lymphadenopathy noted Cardiovascular: Normal rate, regular rhythm. Heart sounds are normal Respiratory: Normal respiratory effort.  No retractions, lungs c t a  Abd: soft nontender bs normal all 4 quad GU: deferred Musculoskeletal: Left hip is tender, left knee is tender, left ankle is tender and swollen.  Some bruising noted along the left ankle.  Neurovascular is intact.   Neurologic:  Normal speech and language.  Skin:  Skin is warm, dry and intact. No rash noted. Psychiatric: Mood and affect are normal. Speech and behavior are normal.  ____________________________________________   LABS (all labs ordered are listed, but only abnormal results are displayed)  Labs Reviewed - No data to display ____________________________________________   ____________________________________________  RADIOLOGY  CT the head and C-spine are both negative for any acute abnormalities X-ray of the pelvis, left  femur, left tib-fib, and left ankle are negative for fracture.  ____________________________________________   PROCEDURES  Procedure(s) performed: Ace wrap applied to the left ankle   Procedures    ____________________________________________   INITIAL IMPRESSION / ASSESSMENT AND PLAN / ED COURSE  Pertinent labs & imaging results that were available during my care of the patient were reviewed by me and considered in my medical decision making (see chart for details).   The patient is an 86 year old female presents emergency department after a fall yesterday.  See HPI  Physical exam shows patient's left ankle to be tender and swollen, she is tender along the left knee and left hip.  C-spine is minimally tender.  No abnormalities noted to the skull.  My exam is unremarkable's time  CT of the head and C-spine are both negative for any acute abnormalities X-ray of the pelvis, left femur, left tib-fib, and left ankle are negative for fracture  I did explain the  findings to the patient.  She still concerned about needing some help at home.  She was placed on Ace wrap.  I did consult social work to see if they can follow-up with outpatient care to see if she would qualify for home health aide.  She is to follow-up with her regular doctor if social work is unable to do this.  Return emergency department for worsening.    Alexa Lowe was evaluated in Emergency Department on 09/05/2019 for the symptoms described in the history of present illness. She was evaluated in the context of the global COVID-19 pandemic, which necessitated consideration that the patient might be at risk for infection with the SARS-CoV-2 virus that causes COVID-19. Institutional protocols and algorithms that pertain to the evaluation of patients at risk for COVID-19 are in a state of rapid change based on information released by regulatory bodies including the CDC and federal and state organizations. These policies and  algorithms were followed during the patient's care in the ED.   As part of my medical decision making, I reviewed the following data within the [place] notes reviewed and incorporated, Old chart reviewed, Radiograph reviewed , Notes from prior ED visits and [place] Controlled Substance Database  ____________________________________________   FINAL CLINICAL IMPRESSION(S) / ED DIAGNOSES  Final diagnoses:  Fall, initial encounter  Sprain of left ankle, unspecified ligament, initial encounter      NEW MEDICATIONS STARTED DURING THIS VISIT:  New Prescriptions   No medications on file     Note:  This document was prepared using Dragon voice recognition software and may include unintentional dictation errors.    Versie Starks, PA-C 09/05/19 1541    Blake Divine, MD 09/05/19 [phone]

## 2019-09-05 NOTE — Discharge Instructions (Signed)
Follow-up with your regular doctor as needed. Follow-up with Dr. Marry Guan if not improving in 3 days. Return emergency department for worsening

## 2019-12-02 DIAGNOSIS — N1831 Chronic kidney disease, stage 3a: Secondary | ICD-10-CM | POA: Diagnosis not present

## 2019-12-02 DIAGNOSIS — E1169 Type 2 diabetes mellitus with other specified complication: Secondary | ICD-10-CM | POA: Diagnosis not present

## 2019-12-02 DIAGNOSIS — G459 Transient cerebral ischemic attack, unspecified: Secondary | ICD-10-CM | POA: Diagnosis not present

## 2019-12-02 DIAGNOSIS — J449 Chronic obstructive pulmonary disease, unspecified: Secondary | ICD-10-CM | POA: Diagnosis not present

## 2019-12-02 DIAGNOSIS — E785 Hyperlipidemia, unspecified: Secondary | ICD-10-CM | POA: Diagnosis not present

## 2019-12-02 DIAGNOSIS — Z8673 Personal history of transient ischemic attack (TIA), and cerebral infarction without residual deficits: Secondary | ICD-10-CM | POA: Diagnosis not present

## 2019-12-02 DIAGNOSIS — I10 Essential (primary) hypertension: Secondary | ICD-10-CM | POA: Diagnosis not present

## 2019-12-02 DIAGNOSIS — Z9889 Other specified postprocedural states: Secondary | ICD-10-CM | POA: Diagnosis not present

## 2019-12-02 DIAGNOSIS — E7800 Pure hypercholesterolemia, unspecified: Secondary | ICD-10-CM | POA: Diagnosis not present

## 2019-12-06 DIAGNOSIS — H903 Sensorineural hearing loss, bilateral: Secondary | ICD-10-CM | POA: Diagnosis not present

## 2019-12-06 DIAGNOSIS — H6123 Impacted cerumen, bilateral: Secondary | ICD-10-CM | POA: Diagnosis not present

## 2019-12-08 DIAGNOSIS — E669 Obesity, unspecified: Secondary | ICD-10-CM | POA: Diagnosis not present

## 2019-12-08 DIAGNOSIS — E1169 Type 2 diabetes mellitus with other specified complication: Secondary | ICD-10-CM | POA: Diagnosis not present

## 2019-12-08 DIAGNOSIS — E785 Hyperlipidemia, unspecified: Secondary | ICD-10-CM | POA: Diagnosis not present

## 2019-12-08 DIAGNOSIS — M19011 Primary osteoarthritis, right shoulder: Secondary | ICD-10-CM | POA: Diagnosis not present

## 2019-12-14 DIAGNOSIS — E7800 Pure hypercholesterolemia, unspecified: Secondary | ICD-10-CM | POA: Diagnosis not present

## 2019-12-14 DIAGNOSIS — I10 Essential (primary) hypertension: Secondary | ICD-10-CM | POA: Diagnosis not present

## 2019-12-14 DIAGNOSIS — E1169 Type 2 diabetes mellitus with other specified complication: Secondary | ICD-10-CM | POA: Diagnosis not present

## 2019-12-14 DIAGNOSIS — N1831 Chronic kidney disease, stage 3a: Secondary | ICD-10-CM | POA: Diagnosis not present

## 2019-12-14 DIAGNOSIS — E785 Hyperlipidemia, unspecified: Secondary | ICD-10-CM | POA: Diagnosis not present

## 2019-12-20 DIAGNOSIS — I10 Essential (primary) hypertension: Secondary | ICD-10-CM | POA: Diagnosis not present

## 2019-12-20 DIAGNOSIS — Z0000 Encounter for general adult medical examination without abnormal findings: Secondary | ICD-10-CM | POA: Diagnosis not present

## 2019-12-20 DIAGNOSIS — E119 Type 2 diabetes mellitus without complications: Secondary | ICD-10-CM | POA: Diagnosis not present

## 2019-12-20 DIAGNOSIS — Z23 Encounter for immunization: Secondary | ICD-10-CM | POA: Diagnosis not present

## 2019-12-20 DIAGNOSIS — E785 Hyperlipidemia, unspecified: Secondary | ICD-10-CM | POA: Diagnosis not present

## 2020-02-22 DIAGNOSIS — G250 Essential tremor: Secondary | ICD-10-CM | POA: Diagnosis not present

## 2020-02-22 DIAGNOSIS — R569 Unspecified convulsions: Secondary | ICD-10-CM | POA: Diagnosis not present

## 2020-02-22 DIAGNOSIS — S99922A Unspecified injury of left foot, initial encounter: Secondary | ICD-10-CM | POA: Diagnosis not present

## 2020-02-28 DIAGNOSIS — M3501 Sicca syndrome with keratoconjunctivitis: Secondary | ICD-10-CM | POA: Diagnosis not present

## 2020-02-28 DIAGNOSIS — H2513 Age-related nuclear cataract, bilateral: Secondary | ICD-10-CM | POA: Diagnosis not present

## 2020-03-15 ENCOUNTER — Ambulatory Visit (INDEPENDENT_AMBULATORY_CARE_PROVIDER_SITE_OTHER): Payer: Medicare HMO

## 2020-03-15 ENCOUNTER — Ambulatory Visit
Admission: EM | Admit: 2020-03-15 | Discharge: 2020-03-15 | Disposition: A | Discharge status: Other Acute Inpatient Hospital | Payer: Medicare HMO | Attending: Family Medicine | Admitting: Family Medicine

## 2020-03-15 ENCOUNTER — Encounter: Payer: Self-pay | Admitting: Emergency Medicine

## 2020-03-15 ENCOUNTER — Other Ambulatory Visit: Payer: Self-pay

## 2020-03-15 ENCOUNTER — Inpatient Hospital Stay
Admission: EM | Admit: 2020-03-15 | Discharge: 2020-03-22 | DRG: 291 | Disposition: A | Discharge status: Skilled Nursing Facility | Payer: Medicare HMO | Source: Ambulatory Visit | Attending: Internal Medicine | Admitting: Internal Medicine

## 2020-03-15 DIAGNOSIS — Z7984 Long term (current) use of oral hypoglycemic drugs: Secondary | ICD-10-CM | POA: Diagnosis not present

## 2020-03-15 DIAGNOSIS — J449 Chronic obstructive pulmonary disease, unspecified: Secondary | ICD-10-CM | POA: Diagnosis not present

## 2020-03-15 DIAGNOSIS — E785 Hyperlipidemia, unspecified: Secondary | ICD-10-CM | POA: Diagnosis not present

## 2020-03-15 DIAGNOSIS — I110 Hypertensive heart disease with heart failure: Principal | ICD-10-CM | POA: Diagnosis present

## 2020-03-15 DIAGNOSIS — R001 Bradycardia, unspecified: Secondary | ICD-10-CM | POA: Diagnosis not present

## 2020-03-15 DIAGNOSIS — Z853 Personal history of malignant neoplasm of breast: Secondary | ICD-10-CM

## 2020-03-15 DIAGNOSIS — R0602 Shortness of breath: Secondary | ICD-10-CM

## 2020-03-15 DIAGNOSIS — Z8249 Family history of ischemic heart disease and other diseases of the circulatory system: Secondary | ICD-10-CM

## 2020-03-15 DIAGNOSIS — I4891 Unspecified atrial fibrillation: Secondary | ICD-10-CM | POA: Diagnosis not present

## 2020-03-15 DIAGNOSIS — R069 Unspecified abnormalities of breathing: Secondary | ICD-10-CM | POA: Diagnosis not present

## 2020-03-15 DIAGNOSIS — Z79899 Other long term (current) drug therapy: Secondary | ICD-10-CM | POA: Diagnosis not present

## 2020-03-15 DIAGNOSIS — Z8673 Personal history of transient ischemic attack (TIA), and cerebral infarction without residual deficits: Secondary | ICD-10-CM | POA: Diagnosis not present

## 2020-03-15 DIAGNOSIS — I959 Hypotension, unspecified: Secondary | ICD-10-CM | POA: Diagnosis not present

## 2020-03-15 DIAGNOSIS — I509 Heart failure, unspecified: Secondary | ICD-10-CM

## 2020-03-15 DIAGNOSIS — I081 Rheumatic disorders of both mitral and tricuspid valves: Secondary | ICD-10-CM | POA: Diagnosis present

## 2020-03-15 DIAGNOSIS — E119 Type 2 diabetes mellitus without complications: Secondary | ICD-10-CM | POA: Diagnosis not present

## 2020-03-15 DIAGNOSIS — F32A Depression, unspecified: Secondary | ICD-10-CM | POA: Diagnosis present

## 2020-03-15 DIAGNOSIS — I10 Essential (primary) hypertension: Secondary | ICD-10-CM | POA: Diagnosis not present

## 2020-03-15 DIAGNOSIS — Z7982 Long term (current) use of aspirin: Secondary | ICD-10-CM | POA: Diagnosis not present

## 2020-03-15 DIAGNOSIS — I480 Paroxysmal atrial fibrillation: Secondary | ICD-10-CM | POA: Diagnosis not present

## 2020-03-15 DIAGNOSIS — E1159 Type 2 diabetes mellitus with other circulatory complications: Secondary | ICD-10-CM | POA: Diagnosis present

## 2020-03-15 DIAGNOSIS — I5031 Acute diastolic (congestive) heart failure: Secondary | ICD-10-CM | POA: Diagnosis present

## 2020-03-15 DIAGNOSIS — R079 Chest pain, unspecified: Secondary | ICD-10-CM | POA: Diagnosis not present

## 2020-03-15 DIAGNOSIS — Z885 Allergy status to narcotic agent status: Secondary | ICD-10-CM

## 2020-03-15 DIAGNOSIS — R279 Unspecified lack of coordination: Secondary | ICD-10-CM | POA: Diagnosis not present

## 2020-03-15 DIAGNOSIS — J90 Pleural effusion, not elsewhere classified: Secondary | ICD-10-CM | POA: Diagnosis not present

## 2020-03-15 DIAGNOSIS — Z66 Do not resuscitate: Secondary | ICD-10-CM | POA: Diagnosis not present

## 2020-03-15 DIAGNOSIS — E1169 Type 2 diabetes mellitus with other specified complication: Secondary | ICD-10-CM | POA: Diagnosis present

## 2020-03-15 DIAGNOSIS — J9811 Atelectasis: Secondary | ICD-10-CM | POA: Diagnosis not present

## 2020-03-15 DIAGNOSIS — R0902 Hypoxemia: Secondary | ICD-10-CM | POA: Diagnosis not present

## 2020-03-15 DIAGNOSIS — Z20822 Contact with and (suspected) exposure to covid-19: Secondary | ICD-10-CM | POA: Diagnosis present

## 2020-03-15 DIAGNOSIS — Z881 Allergy status to other antibiotic agents status: Secondary | ICD-10-CM | POA: Diagnosis not present

## 2020-03-15 DIAGNOSIS — G2581 Restless legs syndrome: Secondary | ICD-10-CM | POA: Diagnosis present

## 2020-03-15 DIAGNOSIS — F419 Anxiety disorder, unspecified: Secondary | ICD-10-CM | POA: Diagnosis present

## 2020-03-15 DIAGNOSIS — R4781 Slurred speech: Secondary | ICD-10-CM | POA: Diagnosis not present

## 2020-03-15 DIAGNOSIS — G250 Essential tremor: Secondary | ICD-10-CM | POA: Diagnosis present

## 2020-03-15 DIAGNOSIS — Z87891 Personal history of nicotine dependence: Secondary | ICD-10-CM

## 2020-03-15 DIAGNOSIS — I4892 Unspecified atrial flutter: Secondary | ICD-10-CM | POA: Diagnosis present

## 2020-03-15 DIAGNOSIS — R5381 Other malaise: Secondary | ICD-10-CM | POA: Diagnosis not present

## 2020-03-15 DIAGNOSIS — M6281 Muscle weakness (generalized): Secondary | ICD-10-CM | POA: Diagnosis not present

## 2020-03-15 LAB — BRAIN NATRIURETIC PEPTIDE: B Natriuretic Peptide: 250.9 pg/mL — ABNORMAL HIGH (ref 0.0–100.0)

## 2020-03-15 LAB — CBC WITH DIFFERENTIAL/PLATELET
Abs Immature Granulocytes: 0.05 10*3/uL (ref 0.00–0.07)
Basophils Absolute: 0 10*3/uL (ref 0.0–0.1)
Basophils Relative: 1 %
Eosinophils Absolute: 0.1 10*3/uL (ref 0.0–0.5)
Eosinophils Relative: 1 %
HCT: 33.3 % — ABNORMAL LOW (ref 36.0–46.0)
Hemoglobin: 10.5 g/dL — ABNORMAL LOW (ref 12.0–15.0)
Immature Granulocytes: 1 %
Lymphocytes Relative: 12 %
Lymphs Abs: 1 10*3/uL (ref 0.7–4.0)
MCH: 29.5 pg (ref 26.0–34.0)
MCHC: 31.5 g/dL (ref 30.0–36.0)
MCV: 93.5 fL (ref 80.0–100.0)
Monocytes Absolute: 0.5 10*3/uL (ref 0.1–1.0)
Monocytes Relative: 5 %
Neutro Abs: 7.2 10*3/uL (ref 1.7–7.7)
Neutrophils Relative %: 80 %
Platelets: 198 10*3/uL (ref 150–400)
RBC: 3.56 MIL/uL — ABNORMAL LOW (ref 3.87–5.11)
RDW: 17.2 % — ABNORMAL HIGH (ref 11.5–15.5)
WBC: 8.9 10*3/uL (ref 4.0–10.5)
nRBC: 0 % (ref 0.0–0.2)

## 2020-03-15 LAB — BASIC METABOLIC PANEL
Anion gap: 11 (ref 5–15)
BUN: 11 mg/dL (ref 8–23)
CO2: 24 mmol/L (ref 22–32)
Calcium: 8.8 mg/dL — ABNORMAL LOW (ref 8.9–10.3)
Chloride: 110 mmol/L (ref 98–111)
Creatinine, Ser: 0.89 mg/dL (ref 0.44–1.00)
GFR, Estimated: >60 mL/min (ref >60)
Glucose, Bld: 121 mg/dL — ABNORMAL HIGH (ref 70–99)
Potassium: 3.5 mmol/L (ref 3.5–5.1)
Sodium: 145 mmol/L (ref 135–145)

## 2020-03-15 LAB — RESP PANEL BY RT-PCR (FLU A&B, COVID) ARPGX2
Influenza A by PCR: NEGATIVE
Influenza B by PCR: NEGATIVE
SARS Coronavirus 2 by RT PCR: NEGATIVE

## 2020-03-15 LAB — TROPONIN I (HIGH SENSITIVITY): Troponin I (High Sensitivity): 12 ng/L (ref <18)

## 2020-03-15 LAB — CBG MONITORING, ED: Glucose-Capillary: 199 mg/dL — ABNORMAL HIGH (ref 70–99)

## 2020-03-15 MED ORDER — ATORVASTATIN CALCIUM 20 MG PO TABS
40.0000 mg | ORAL_TABLET | Freq: Every day | ORAL | Status: DC
  Administered 2020-03-15 – 2020-03-21 (×7): 40 mg via ORAL
  Filled 2020-03-15 (×7): qty 2

## 2020-03-15 MED ORDER — CITALOPRAM HYDROBROMIDE 20 MG PO TABS
20.0000 mg | ORAL_TABLET | Freq: Every day | ORAL | Status: DC
  Administered 2020-03-16 – 2020-03-22 (×7): 20 mg via ORAL
  Filled 2020-03-15 (×7): qty 1

## 2020-03-15 MED ORDER — METOPROLOL TARTRATE 5 MG/5ML IV SOLN: 2.5000 mg | INTRAVENOUS | Status: DC | PRN

## 2020-03-15 MED ORDER — INSULIN ASPART 100 UNIT/ML ~~LOC~~ SOLN
0.0000 [IU] | Freq: Three times a day (TID) | SUBCUTANEOUS | Status: DC
  Administered 2020-03-16 (×3): 1 [IU] via SUBCUTANEOUS
  Administered 2020-03-17: 2 [IU] via SUBCUTANEOUS
  Administered 2020-03-17 – 2020-03-18 (×2): 1 [IU] via SUBCUTANEOUS
  Administered 2020-03-18: 18:00:00 9 [IU] via SUBCUTANEOUS
  Administered 2020-03-19 (×2): 2 [IU] via SUBCUTANEOUS
  Administered 2020-03-19: 08:00:00 1 [IU] via SUBCUTANEOUS
  Filled 2020-03-15 (×9): qty 1

## 2020-03-15 MED ORDER — LOSARTAN POTASSIUM 50 MG PO TABS
50.0000 mg | ORAL_TABLET | Freq: Every day | ORAL | Status: DC
  Administered 2020-03-16 – 2020-03-22 (×5): 50 mg via ORAL
  Filled 2020-03-15 (×3): qty 1

## 2020-03-15 MED ORDER — ROPINIROLE HCL 0.25 MG PO TABS
0.2500 mg | ORAL_TABLET | Freq: Every day | ORAL | Status: DC
  Administered 2020-03-16 – 2020-03-21 (×7): 0.25 mg via ORAL
  Filled 2020-03-15 (×9): qty 1

## 2020-03-15 MED ORDER — ALBUTEROL SULFATE HFA 108 (90 BASE) MCG/ACT IN AERS
1.0000 | INHALATION_SPRAY | RESPIRATORY_TRACT | Status: DC | PRN
  Filled 2020-03-15: qty 6.7

## 2020-03-15 MED ORDER — POTASSIUM CHLORIDE 20 MEQ PO PACK
20.0000 meq | PACK | Freq: Every day | ORAL | Status: DC
  Administered 2020-03-15 – 2020-03-21 (×7): 20 meq via ORAL
  Filled 2020-03-15 (×7): qty 1

## 2020-03-15 MED ORDER — PRIMIDONE 50 MG PO TABS
25.0000 mg | ORAL_TABLET | Freq: Two times a day (BID) | ORAL | Status: DC
  Administered 2020-03-16 – 2020-03-22 (×11): 25 mg via ORAL
  Filled 2020-03-15 (×15): qty 0.5

## 2020-03-15 MED ORDER — FUROSEMIDE 10 MG/ML IJ SOLN
40.0000 mg | Freq: Two times a day (BID) | INTRAMUSCULAR | Status: DC
  Administered 2020-03-16 – 2020-03-17 (×3): 40 mg via INTRAVENOUS
  Filled 2020-03-15 (×3): qty 4

## 2020-03-15 MED ORDER — ONDANSETRON HCL 4 MG/2ML IJ SOLN: 4.0000 mg | Freq: Four times a day (QID) | INTRAMUSCULAR | Status: DC | PRN

## 2020-03-15 MED ORDER — FUROSEMIDE 10 MG/ML IJ SOLN
40.0000 mg | Freq: Once | INTRAMUSCULAR | Status: AC
  Administered 2020-03-15: 40 mg via INTRAVENOUS
  Filled 2020-03-15: qty 4

## 2020-03-15 MED ORDER — GABAPENTIN 300 MG PO CAPS
300.0000 mg | ORAL_CAPSULE | Freq: Three times a day (TID) | ORAL | Status: DC
  Administered 2020-03-15 – 2020-03-22 (×20): 300 mg via ORAL
  Filled 2020-03-15 (×20): qty 1

## 2020-03-15 MED ORDER — MELATONIN 5 MG PO TABS
5.0000 mg | ORAL_TABLET | Freq: Every evening | ORAL | Status: DC | PRN
  Administered 2020-03-16 – 2020-03-21 (×3): 5 mg via ORAL
  Filled 2020-03-15 (×3): qty 1

## 2020-03-15 MED ORDER — PANTOPRAZOLE SODIUM 40 MG PO TBEC
40.0000 mg | DELAYED_RELEASE_TABLET | Freq: Every day | ORAL | Status: DC
  Administered 2020-03-16 – 2020-03-22 (×7): 40 mg via ORAL
  Filled 2020-03-15 (×7): qty 1

## 2020-03-15 MED ORDER — ACETAMINOPHEN 325 MG PO TABS
650.0000 mg | ORAL_TABLET | ORAL | Status: DC | PRN
  Administered 2020-03-17 – 2020-03-20 (×6): 650 mg via ORAL
  Filled 2020-03-15 (×6): qty 2

## 2020-03-15 MED ORDER — APIXABAN 5 MG PO TABS
5.0000 mg | ORAL_TABLET | Freq: Two times a day (BID) | ORAL | Status: DC
  Administered 2020-03-15 – 2020-03-17 (×4): 5 mg via ORAL
  Filled 2020-03-15 (×5): qty 1

## 2020-03-15 MED ORDER — METOPROLOL TARTRATE 25 MG PO TABS
12.5000 mg | ORAL_TABLET | Freq: Two times a day (BID) | ORAL | Status: DC
  Administered 2020-03-15 – 2020-03-16 (×3): 12.5 mg via ORAL
  Filled 2020-03-15 (×3): qty 1

## 2020-03-15 NOTE — ED Triage Notes (Signed)
Patient c/o SOB and a mild cough  that started 3-4 days ago. Patient states she got her Moderna booster on 11/23 and states she hasn't felt well since then. She was at orthopedics today to receive an injection in her shoulder and they called her daughter because she was not breathing well and they did not want her driving home. She also reports a weight gain of 10lbs in 2 weeks and swelling in bilateral LE edema.

## 2020-03-15 NOTE — ED Provider Notes (Signed)
[facility] Emergency Department Provider Note   ____________________________________________   First MD Initiated Contact with Patient 03/15/20 1856     (approximate)  I have reviewed the triage vital signs and the nursing notes.   HISTORY  Chief Complaint Shortness of Breath    HPI Alexa Lowe is a 86 y.o. female with past medical history of hypertension, diabetes, COPD who presents to the ED complaining of shortness of breath.  Patient reports that she initially presented to her orthopedist office earlier today for steroid injection in her right shoulder.  Upon arriving at the desk, patient was noted to be very short of breath and weak.  She received the steroid injection but her orthopedist recommended that she be seen at the urgent care for further evaluation.  She was again noted to be very short of breath there and due to concern for new onset CHF as well as atrial fibrillation, patient was referred to the ED.  Patient reports she has been having gradually worsening dyspnea on exertion for the past 3 weeks.  She also has palpated her chest with exertion, which she describes as sharp and not currently present.  Her breathing is better when she is at rest.  She has had increasing swelling in her lower extremities and has been unable to lay flat due to her difficulty breathing.  She reports a weight gain of about 10 pounds in the past few weeks.  She denies any history of atrial fibrillation or heart failure.        Past Medical History:  Diagnosis Date  . Cancer [facility])    Right breast  . COPD (chronic obstructive pulmonary disease) ([place])   . Diabetes mellitus without complication ([place])   . Hypertension     Patient Active Problem List   Diagnosis Date Noted  . Chronic gastric ulcer without hemorrhage and without perforation   . Encephalopathy 08/25/2018  . TIA (transient ischemic attack) 08/24/2018  . Symptomatic anemia 05/19/2018  . HTN  (hypertension) 05/19/2018  . Diabetes ([place]) 05/19/2018  . COPD (chronic obstructive pulmonary disease) ([place]) 05/19/2018    Past Surgical History:  Procedure Laterality Date  . ABDOMINAL SURGERY    . APPENDECTOMY    . BACK SURGERY    . BREAST SURGERY    . CHOLECYSTECTOMY    . ESOPHAGOGASTRODUODENOSCOPY N/A 08/31/2018   Procedure: ESOPHAGOGASTRODUODENOSCOPY (EGD);  Surgeon: Lin Landsman, MD;  Location: [facility] ENDOSCOPY;  Service: Gastroenterology;  Laterality: N/A;  . ESOPHAGOGASTRODUODENOSCOPY (EGD) WITH PROPOFOL N/A 05/20/2018   Procedure: ESOPHAGOGASTRODUODENOSCOPY (EGD) WITH PROPOFOL;  Surgeon: Toledo, Benay Pike, MD;  Location: [facility] ENDOSCOPY;  Service: Gastroenterology;  Laterality: N/A;  . ESOPHAGOGASTRODUODENOSCOPY (EGD) WITH PROPOFOL N/A 12/17/2018   Procedure: ESOPHAGOGASTRODUODENOSCOPY (EGD) WITH PROPOFOL;  Surgeon: Lin Landsman, MD;  Location: [facility] ENDOSCOPY;  Service: Gastroenterology;  Laterality: N/A;    Prior to Admission medications   Medication Sig Start Date End Date Taking? Authorizing Provider  allopurinol (ZYLOPRIM) 300 MG tablet Take 300 mg by mouth daily.  10/29/18   [provider]  aspirin 325 MG tablet Take 325 mg by mouth daily.    [provider]  citalopram (CELEXA) 20 MG tablet Take 20 mg by mouth daily.     [provider]  losartan (COZAAR) 50 MG tablet Take 50 mg by mouth daily.  11/28/18   [provider]  metFORMIN (GLUCOPHAGE) 500 MG tablet Take 500 mg by mouth in the morning and at bedtime.  10/29/18  [provider]  pantoprazole (PROTONIX) 40 MG tablet Take 40 mg by mouth daily.    [provider]  pregabalin (LYRICA) 50 MG capsule Take 50 mg by mouth 2 (two) times daily.    [provider]  ranitidine (ZANTAC) 150 MG tablet Take 150 mg by mouth daily.    [provider]  traMADol (ULTRAM) 50 MG tablet Take 100 mg by mouth at bedtime. Can also take every four hour if  needed 11/03/18   [provider]    Allergies Morphine and related, Ace inhibitors, Doxycycline, Erythromycin ethylsuccinate, and Codeine  No family history on file.  Social History Social History   Tobacco Use  . Smoking status: Former Research scientist (life sciences)  . Smokeless tobacco: Never Used  Substance Use Topics  . Alcohol use: No  . Drug use: No    Review of Systems  Constitutional: No fever/chills Eyes: No visual changes. ENT: No sore throat. Cardiovascular: Positive for chest pain. Respiratory: Positive for shortness of breath. Gastrointestinal: No abdominal pain.  No nausea, no vomiting.  No diarrhea.  No constipation. Genitourinary: Negative for dysuria. Musculoskeletal: Negative for back pain.  Positive for leg swelling. Skin: Negative for rash. Neurological: Negative for headaches, focal weakness or numbness.  ____________________________________________   PHYSICAL EXAM:  VITAL SIGNS: ED Triage Vitals  Enc Vitals Group     BP      Pulse      Resp      Temp      Temp src      SpO2      Weight      Height      Head Circumference      Peak Flow      Pain Score      Pain Loc      Pain Edu?      Excl. in [place]?     Constitutional: Alert and oriented. Eyes: Conjunctivae are normal. Head: Atraumatic. Nose: No congestion/rhinnorhea. Mouth/Throat: Mucous membranes are moist. Neck: Normal ROM Cardiovascular: Normal rate, irregularly irregular rhythm. Grossly normal heart sounds. Respiratory: Tachypneic with increased respiratory effort.  No retractions. Lungs with crackles to bilateral bases. Gastrointestinal: Soft and nontender. No distention. Genitourinary: deferred Musculoskeletal: No lower extremity tenderness, 2+ pitting edema to knees bilaterally. Neurologic:  Normal speech and language. No gross focal neurologic deficits are appreciated. Skin:  Skin is warm, dry and intact. No rash noted. Psychiatric: Mood and affect are normal. Speech and behavior are  normal.  ____________________________________________   LABS (all labs ordered are listed, but only abnormal results are displayed)  Labs Reviewed  CBC WITH DIFFERENTIAL/PLATELET - Abnormal; Notable for the following components:      Result Value   RBC 3.56 (*)    Hemoglobin 10.5 (*)    HCT 33.3 (*)    RDW 17.2 (*)    All other components within normal limits  BRAIN NATRIURETIC PEPTIDE - Abnormal; Notable for the following components:   B Natriuretic Peptide 250.9 (*)    All other components within normal limits  BASIC METABOLIC PANEL - Abnormal; Notable for the following components:   Glucose, Bld 121 (*)    Calcium 8.8 (*)    All other components within normal limits  RESP PANEL BY RT-PCR (FLU A&B, COVID) ARPGX2  TROPONIN I (HIGH SENSITIVITY)   ____________________________________________  EKG  ED ECG REPORT I, Blake Divine, the attending physician, personally viewed and interpreted this ECG.   Date: 03/15/2020  EKG Time: 18:54  Rate: Approximately 100  Rhythm: atrial fibrillation  Axis: LAD  Intervals:none  ST&T Change: None   PROCEDURES  Procedure(s) performed (including Critical Care):  Procedures   ____________________________________________   INITIAL IMPRESSION / ASSESSMENT AND PLAN / ED COURSE       86 year old female with past medical history of hypertension, diabetes, and COPD who presents to the ED complaining of increasing dyspnea on exertion for the past 3 weeks with lower extremity swelling and difficulty lying flat due to breathing issues.  She has had intermittent chest pain but is not having any currently.  EKG shows atrial fibrillation, which is new onset, but no acute ischemic changes noted and rate is well controlled.  Chest x-ray reviewed from urgent care which shows small bilateral pleural effusions but no overt pulmonary edema.  We will check troponin, BNP, basic labs, and testing for COVID-19.  Troponin within normal limits, BNP  mildly elevated, remainder of labs unremarkable.  We will treat patient with IV Lasix and case discussed with hospitalist for admission for new onset CHF and atrial fibrillation.      ____________________________________________   FINAL CLINICAL IMPRESSION(S) / ED DIAGNOSES  Final diagnoses:  New onset atrial fibrillation ([place])  Acute heart failure, unspecified heart failure type [facility])     ED Discharge Orders    None       Note:  This document was prepared using Dragon voice recognition software and may include unintentional dictation errors.   Blake Divine, MD 03/15/20 2000

## 2020-03-15 NOTE — H&P (Signed)
History and Physical    Alexa Lowe POL:[id number] DOB: Oct 23, 1930 DOA: 03/15/2020  PCP: Dion Body, MD  Patient coming from: Home via urgent care  I have personally briefly reviewed patient's old medical records in [place]  Chief Complaint: Shortness of breath  HPI: Alexa Lowe is a 86 y.o. female with medical history significant for COPD, T2DM, HTN, HLD, history of CVA, essential tremor, restless leg syndrome who presents to the ED for evaluation of shortness of breath and new onset atrial fibrillation.  Patient states she has had 3 days of new onset shortness of breath, dyspnea on minimal exertion, and intermittent central chest discomfort which is nonradiating.  She says she has had associated orthopnea and swelling in both of her legs.  She says she has noticed a 10 pound weight gain over the last 2 weeks.  She has had a nonproductive cough.  She takes aspirin 81 mg daily.  She denies any obvious bleeding.  She denies any known personal history of CHF or atrial fibrillation.  She says she normally transports with the use of a wheelchair but does occasionally ambulate short distances with assistance.  Patient initially went to her orthopedic clinic today to receive an injection in her shoulder.  She was noted to have difficulty breathing and was sent to urgent care.  At urgent care she was noted to have new onset atrial fibrillation with suspected new onset CHF and subsequently sent to the ED.  ED Course:  Initial vitals showed BP 156/90, pulse 65, RR 20, temp 97.8 F, SPO2 94% on room air.  Labs show WBC 8.9, hemoglobin 10.5, platelets 198,000, sodium 145, potassium 3.5, bicarb 24, BUN 11, creatinine 0.89, serum glucose 121, BNP 250.9, high-sensitivity troponin I 12.  SARS-CoV-2 PCR is collected and pending.  2 view chest x-ray shows small bilateral pleural effusions.  Patient was given IV Lasix 40 mg once.  The hospitalist service was consulted to admit for  further evaluation and management.  Review of Systems: All systems reviewed and are negative except as documented in history of present illness above.   Past Medical History:  Diagnosis Date  . Cancer [facility])    Right breast  . COPD (chronic obstructive pulmonary disease) ([place])   . Diabetes mellitus without complication ([place])   . Hypertension     Past Surgical History:  Procedure Laterality Date  . ABDOMINAL SURGERY    . APPENDECTOMY    . BACK SURGERY    . BREAST SURGERY    . CHOLECYSTECTOMY    . ESOPHAGOGASTRODUODENOSCOPY N/A 08/31/2018   Procedure: ESOPHAGOGASTRODUODENOSCOPY (EGD);  Surgeon: Lin Landsman, MD;  Location: [facility] ENDOSCOPY;  Service: Gastroenterology;  Laterality: N/A;  . ESOPHAGOGASTRODUODENOSCOPY (EGD) WITH PROPOFOL N/A 05/20/2018   Procedure: ESOPHAGOGASTRODUODENOSCOPY (EGD) WITH PROPOFOL;  Surgeon: Toledo, Benay Pike, MD;  Location: [facility] ENDOSCOPY;  Service: Gastroenterology;  Laterality: N/A;  . ESOPHAGOGASTRODUODENOSCOPY (EGD) WITH PROPOFOL N/A 12/17/2018   Procedure: ESOPHAGOGASTRODUODENOSCOPY (EGD) WITH PROPOFOL;  Surgeon: Lin Landsman, MD;  Location: [facility] ENDOSCOPY;  Service: Gastroenterology;  Laterality: N/A;    Social History:  reports that she has quit smoking. She has never used smokeless tobacco. She reports that she does not drink alcohol and does not use drugs.  Allergies  Allergen Reactions  . Morphine And Related Other (See Comments)    Reaction:  Hallucinations   . Ace Inhibitors Other (See Comments)  . Doxycycline Other (See Comments)    Caused GI upset  . Erythromycin Ethylsuccinate Other (  See Comments)  . Codeine Nausea And Vomiting    Family History  Problem Relation Age of Onset  . CAD Father      Prior to Admission medications   Medication Sig Start Date End Date Taking? Authorizing Provider  allopurinol (ZYLOPRIM) 300 MG tablet Take 300 mg by mouth daily.  10/29/18   [provider]  aspirin 325 MG tablet  Take 325 mg by mouth daily.    [provider]  citalopram (CELEXA) 20 MG tablet Take 20 mg by mouth daily.     [provider]  losartan (COZAAR) 50 MG tablet Take 50 mg by mouth daily.  11/28/18   [provider]  metFORMIN (GLUCOPHAGE) 500 MG tablet Take 500 mg by mouth in the morning and at bedtime.  10/29/18   [provider]  pantoprazole (PROTONIX) 40 MG tablet Take 40 mg by mouth daily.    [provider]  pregabalin (LYRICA) 50 MG capsule Take 50 mg by mouth 2 (two) times daily.    [provider]  ranitidine (ZANTAC) 150 MG tablet Take 150 mg by mouth daily.    [provider]  traMADol (ULTRAM) 50 MG tablet Take 100 mg by mouth at bedtime. Can also take every four hour if needed 11/03/18   [provider]    Physical Exam: Vitals:   03/15/20 1900 03/15/20 1903 03/15/20 1930 03/15/20 1945  BP: 98/85 (!) 156/90 (!) 153/110 (!) 149/103  Pulse: 63 65 66 (!) 105  Resp: 18 20 16 15   Temp: 97.8 F (36.6 C)     TempSrc: Oral     SpO2: 94% 94% 96% 94%  Weight:      Height:       Constitutional: Elderly woman sitting up in bed, NAD, calm, comfortable Eyes: PERRL, lids and conjunctivae normal ENMT: Mucous membranes are moist. Posterior pharynx clear of any exudate or lesions.Normal dentition.  Somewhat hard of hearing. Neck: normal, supple, no masses. Respiratory: clear to auscultation bilaterally, no wheezing, no crackles. Normal respiratory effort. No accessory muscle use.  Cardiovascular: Irregularly irregular and tachycardic, no murmurs / rubs / gallops.  Trace bilateral lower extremity edema. 2+ pedal pulses. Abdomen: no tenderness, no masses palpated. No hepatosplenomegaly. Bowel sounds positive.  Musculoskeletal: no clubbing / cyanosis. No joint deformity upper and lower extremities. Good ROM, no contractures. Normal muscle tone.  Skin: no rashes, lesions, ulcers. No induration Neurologic: CN 2-12 grossly  intact. Sensation intact, Strength 5/5 in all 4.  Mild resting tremor of hands and mandible. Psychiatric: Normal judgment and insight. Alert and oriented x 3. Normal mood.    Labs on Admission: I have personally reviewed following labs and imaging studies  CBC: Recent Labs  Lab 03/15/20 1859  WBC 8.9  NEUTROABS 7.2  HGB 10.5*  HCT 33.3*  MCV 93.5  PLT 196   Basic Metabolic Panel: Recent Labs  Lab 03/15/20 1859  NA 145  K 3.5  CL 110  CO2 24  GLUCOSE 121*  BUN 11  CREATININE 0.89  CALCIUM 8.8*   GFR: Estimated Creatinine Clearance: 50.5 mL/min (by C-G formula based on SCr of 0.89 mg/dL). Liver Function Tests: No results for input(s): AST, ALT, ALKPHOS, BILITOT, PROT, ALBUMIN in the last 168 hours. No results for input(s): LIPASE, AMYLASE in the last 168 hours. No results for input(s): AMMONIA in the last 168 hours. Coagulation Profile: No results for input(s): INR, PROTIME in the last 168 hours. Cardiac Enzymes: No results for input(s): CKTOTAL,  CKMB, CKMBINDEX, TROPONINI in the last 168 hours. BNP (last 3 results) No results for input(s): PROBNP in the last 8760 hours. HbA1C: No results for input(s): HGBA1C in the last 72 hours. CBG: No results for input(s): GLUCAP in the last 168 hours. Lipid Profile: No results for input(s): CHOL, HDL, LDLCALC, TRIG, CHOLHDL, LDLDIRECT in the last 72 hours. Thyroid Function Tests: No results for input(s): TSH, T4TOTAL, FREET4, T3FREE, THYROIDAB in the last 72 hours. Anemia Panel: No results for input(s): VITAMINB12, FOLATE, FERRITIN, TIBC, IRON, RETICCTPCT in the last 72 hours. Urine analysis:    Component Value Date/Time   COLORURINE YELLOW (A) 08/24/2018 0421   APPEARANCEUR CLEAR (A) 08/24/2018 0421   APPEARANCEUR Clear 12/08/2012 1942   LABSPEC 1.020 08/24/2018 0421   LABSPEC 1.017 12/08/2012 1942   PHURINE 5.0 08/24/2018 0421   GLUCOSEU NEGATIVE 08/24/2018 0421   GLUCOSEU Negative 12/08/2012 1942   HGBUR NEGATIVE  08/24/2018 0421   BILIRUBINUR NEGATIVE 08/24/2018 0421   BILIRUBINUR Negative 12/08/2012 1942   KETONESUR NEGATIVE 08/24/2018 0421   PROTEINUR NEGATIVE 08/24/2018 0421   NITRITE NEGATIVE 08/24/2018 0421   LEUKOCYTESUR NEGATIVE 08/24/2018 0421   LEUKOCYTESUR Negative 12/08/2012 1942    Radiological Exams on Admission: DG Chest 2 View  Result Date: 03/15/2020 CLINICAL DATA:  Short of breath EXAM: CHEST - 2 VIEW COMPARISON:  06/16/2019 FINDINGS: Small bilateral pleural effusions. Mild airspace disease at the bases presumably atelectasis. Normal cardiac size. No pneumothorax. Surgical hardware in the cervical spine IMPRESSION: Small bilateral pleural effusions with bibasilar atelectasis. Electronically Signed   By: Donavan Foil M.D.   On: 03/15/2020 17:49    EKG: Personally reviewed. Atrial fibrillation with RVR, low voltage, LAFB.  Rate is faster when compared to prior from same day.  Atrial fibrillation is new when compared to multiple previous EKGs  Assessment/Plan Principal Problem:   Acute CHF (congestive heart failure) (HCC) Active Problems:   Hypertension associated with diabetes (HCC)   Diabetes (HCC)   COPD (chronic obstructive pulmonary disease) (HCC)   PAF (paroxysmal atrial fibrillation) (HCC)   Hyperlipidemia associated with type 2 diabetes mellitus ([place])   History of CVA (cerebrovascular accident)  Alexa Lowe is a 86 y.o. female with medical history significant for COPD, T2DM, HTN, HLD, history of CVA, essential tremor, restless leg syndrome who is admitted with new onset atrial fibrillation.  Atrial fibrillation/flutter with RVR: Appears to be new onset with variable rate on admission and up to 140s during examination.  CHA2DS2-VASc score is at least 7.  She takes aspirin 81 mg daily due to history of stroke but is not on any anticoagulation as an outpatient.  She denies any obvious bleeding.  I discussed the risk versus benefits of anticoagulation to reduce the risk  of stroke and she is agreeable to start oral anticoagulation. -Start Eliquis 5 mg twice daily -Start metoprolol 12.5 mg twice daily and use IV metoprolol 2.5 mg prn for RVR -Echocardiogram -Monitor on telemetry -Check TSH, magnesium  Acute CHF: No prior known history of CHF.  Likely mediated due to A. fib.  CXR with small bilateral pleural effusions.  She has had new symptoms of orthopnea, PND, DOE, lower extremity swelling, and 10 pound weight gain over 2 weeks. S/p IV Lasix 40 mg in the ED. -Continue IV Lasix 40 mg twice daily and switch to orals as able -Strict I/O's and daily weights -Supplement potassium -Echocardiogram  History of CVA: Punctate left parietal lobe infarct May 2020.  Per prior neurology note, concern  for embolic etiology but echocardiogram was without abnormality at that time. -Started on Eliquis as above -Holding aspirin -Continue atorvastatin  Type 2 diabetes: Holding home Metformin and placed on sensitive SSI while in hospital.  Hypertension: BP elevated on admission.  Resuminng home losartan and started on metoprolol as above.  Hyperlipidemia: Continue atorvastatin.  COPD: Appears to be stable without wheezing on admission.  Continue albuterol as needed.  Essential tremor/restless leg syndrome: Continue primidone and ropinirole.  Depression/anxiety: Continue Celexa.   DVT prophylaxis: Eliquis Code Status: DNR, confirmed with patient Family Communication: Discussed with patient, she has discussed with family Disposition Plan: From home and likely discharge to home pending further work-up and management of A. fib/CHF. Consults called: None Admission status:  Status is: Observation  The patient remains OBS appropriate and will d/c before 2 midnights.  Dispo: The patient is from: Home              Anticipated d/c is to: Home              Anticipated d/c date is: 1 day              Patient currently is not medically stable to d/c.   Zada Finders MD [facility]  If 7PM-7AM, please contact night-coverage [email or web address]  03/15/2020, 8:14 PM

## 2020-03-15 NOTE — ED Triage Notes (Signed)
PT to ED via EMS from [facility] c/o shob. Initially stated it started after cortisone shot, but sounds like pt has been feeling bad and short of breath for about a weekend just worsened after the exertion of walking into the clinic. PT has pitting edema in BLE, pt states gained 10lbs this week. Cannot lie flat which is new for pt.

## 2020-03-15 NOTE — ED Notes (Signed)
PT requesting her night time meds, including gabapentin and melatonin that are not ordered. Messaged NP on call.   PT also changed into hospital gown. Clothing placed in pt belonging bags.

## 2020-03-15 NOTE — ED Notes (Signed)
Patient is being discharged from the Urgent Care and sent to the Emergency Department via EMS. Per Dr. Lacinda Axon, patient is in need of higher level of care due to SOB, new onset CHF and A-fib. Patient is aware and verbalizes understanding of plan of care.  Vitals:   03/15/20 1637  BP: 111/68  Pulse: (!) 120  Resp: (!) 26  Temp: 98.2 F (36.8 C)  SpO2: 94%

## 2020-03-15 NOTE — ED Provider Notes (Signed)
[facility]    CSN: [id number] Arrival date & time: 03/15/20  1549   History   Chief Complaint Chief Complaint  Patient presents with  . Shortness of Breath  . Leg Swelling   HPI  86 year old female presents with the above complaints.  Patient reports that she has had ongoing shortness of breath for the past 3 to 4 days. She was seen by orthopedics today and was sent over here for evaluation given shortness of breath. Patient reports that she has had a 10 pound weight gain in the past 2 weeks. She has significant lower extremity edema. Patient states that she is very winded and cannot even walk without getting short of breath. Patient states that she is normally in a wheelchair. Patient notes that she has not felt well since she got her booster vaccine on 11/23. Occasional cough. No fever. No reported sick contacts. No other complaints.  Past Medical History:  Diagnosis Date  . Cancer [facility])    Right breast  . COPD (chronic obstructive pulmonary disease) ([place])   . Diabetes mellitus without complication ([place])   . Hypertension     Patient Active Problem List   Diagnosis Date Noted  . Chronic gastric ulcer without hemorrhage and without perforation   . Encephalopathy 08/25/2018  . TIA (transient ischemic attack) 08/24/2018  . Symptomatic anemia 05/19/2018  . HTN (hypertension) 05/19/2018  . Diabetes ([place]) 05/19/2018  . COPD (chronic obstructive pulmonary disease) ([place]) 05/19/2018    Past Surgical History:  Procedure Laterality Date  . ABDOMINAL SURGERY    . APPENDECTOMY    . BACK SURGERY    . BREAST SURGERY    . CHOLECYSTECTOMY    . ESOPHAGOGASTRODUODENOSCOPY N/A 08/31/2018   Procedure: ESOPHAGOGASTRODUODENOSCOPY (EGD);  Surgeon: Lin Landsman, MD;  Location[facility] ENDOSCOPY;  Service: Gastroenterology;  Laterality: N/A;  . ESOPHAGOGASTRODUODENOSCOPY (EGD) WITH PROPOFOL N/A 05/20/2018   Procedure: ESOPHAGOGASTRODUODENOSCOPY (EGD) WITH PROPOFOL;  Surgeon:  Toledo, Benay Pike, MD;  Location: [facility] ENDOSCOPY;  Service: Gastroenterology;  Laterality: N/A;  . ESOPHAGOGASTRODUODENOSCOPY (EGD) WITH PROPOFOL N/A 12/17/2018   Procedure: ESOPHAGOGASTRODUODENOSCOPY (EGD) WITH PROPOFOL;  Surgeon: Lin Landsman, MD;  Location: [facility] ENDOSCOPY;  Service: Gastroenterology;  Laterality: N/A;    OB History   No obstetric history on file.      Home Medications    Prior to Admission medications   Medication Sig Start Date End Date Taking? Authorizing Provider  allopurinol (ZYLOPRIM) 300 MG tablet Take 300 mg by mouth daily.  10/29/18  Yes [provider]  aspirin 325 MG tablet Take 325 mg by mouth daily.   Yes [provider]  citalopram (CELEXA) 20 MG tablet Take 20 mg by mouth daily.    Yes [provider]  losartan (COZAAR) 50 MG tablet Take 50 mg by mouth daily.  11/28/18  Yes [provider]  metFORMIN (GLUCOPHAGE) 500 MG tablet Take 500 mg by mouth in the morning and at bedtime.  10/29/18  Yes [provider]  pantoprazole (PROTONIX) 40 MG tablet Take 40 mg by mouth daily.   Yes [provider]  pregabalin (LYRICA) 50 MG capsule Take 50 mg by mouth 2 (two) times daily.   Yes [provider]  ranitidine (ZANTAC) 150 MG tablet Take 150 mg by mouth daily.   Yes [provider]  traMADol (ULTRAM) 50 MG tablet Take 100 mg by mouth at bedtime. Can also take every four hour if needed 11/03/18  Yes [provider]  Social History Social History   Tobacco Use  . Smoking status: Former Research scientist (life sciences)  . Smokeless tobacco: Never Used  Substance Use Topics  . Alcohol use: No  . Drug use: No     Allergies   Morphine and related, Ace inhibitors, Doxycycline, Erythromycin ethylsuccinate, and Codeine   Review of Systems Review of Systems  Constitutional: Positive for unexpected weight change.  Respiratory: Positive for shortness of breath.   Cardiovascular: Positive for leg  swelling.   Physical Exam Triage Vital Signs ED Triage Vitals  Enc Vitals Group     BP 03/15/20 1637 111/68     Pulse Rate 03/15/20 1637 (!) 120     Resp 03/15/20 1637 (!) 26     Temp 03/15/20 1637 98.2 F (36.8 C)     Temp Source 03/15/20 1637 Oral     SpO2 03/15/20 1637 94 %     Weight 03/15/20 1638 230 lb (104.3 kg)     Height 03/15/20 1638 5\' 2"  (1.575 m)     Head Circumference --      Peak Flow --      Pain Score 03/15/20 1637 0     Pain Loc --      Pain Edu? --      Excl. in [place]? --    Updated Vital Signs BP 111/68 (BP Location: Right Arm)   Pulse (!) 120   Temp 98.2 F (36.8 C) (Oral)   Resp (!) 26   Ht 5\' 2"  (1.575 m)   Wt 104.3 kg   SpO2 94%   BMI 42.07 kg/m   Visual Acuity Right Eye Distance:   Left Eye Distance:   Bilateral Distance:    Right Eye Near:   Left Eye Near:    Bilateral Near:     Physical Exam Vitals and nursing note reviewed.  Constitutional:      Appearance: She is obese. She is ill-appearing.  HENT:     Head: Normocephalic and atraumatic.  Eyes:     General:        Right eye: No discharge.        Left eye: No discharge.     Conjunctiva/sclera: Conjunctivae normal.  Cardiovascular:     Rate and Rhythm: Tachycardia present. Rhythm irregular.     Comments: 2+ pitting lower extremity edema.  Pulmonary:     Comments: Increased work of breathing. Basilar crackles.  Neurological:     Mental Status: She is alert.  Psychiatric:        Mood and Affect: Mood normal.        Behavior: Behavior normal.    UC Treatments / Results  Labs (all labs ordered are listed, but only abnormal results are displayed) Labs Reviewed - No data to display  EKG Interpretation: Atrial fibrillation.  ? Junctional rhythm.  Radiology DG Chest 2 View  Result Date: 03/15/2020 CLINICAL DATA:  Short of breath EXAM: CHEST - 2 VIEW COMPARISON:  06/16/2019 FINDINGS: Small bilateral pleural effusions. Mild airspace disease at the bases presumably atelectasis.  Normal cardiac size. No pneumothorax. Surgical hardware in the cervical spine IMPRESSION: Small bilateral pleural effusions with bibasilar atelectasis. Electronically Signed   By: Donavan Foil M.D.   On: 03/15/2020 17:49    Procedures Procedures (including critical care time)  Medications Ordered in UC Medications - No data to display  Initial Impression / Assessment and Plan / UC Course  I have reviewed the triage vital signs and the nursing notes.  Pertinent labs &  imaging results that were available during my care of the patient were reviewed by me and considered in my medical decision making (see chart for details).    86 year old female presents with new onset acute dyspnea.  Overall ill-appearing.  EKG was obtained and revealed atrial fibrillation with an odd conduction pattern.  Chest x-ray was obtained and revealed bilateral pleural effusions.  She has had an ongoing weight gain and significant lower extremity edema.  I suspect that she has new onset congestive heart failure.  Patient is being sent to the hospital via EMS for further evaluation and management.  Will need hospital admission.   Final Clinical Impressions(s) / UC Diagnoses   Final diagnoses:  Acute congestive heart failure, unspecified heart failure type (HCC)  Atrial fibrillation, unspecified type ([place])  Pleural effusion   Discharge Instructions   None    ED Prescriptions    None     PDMP not reviewed this encounter.   Coral Spikes, [place] 03/15/20 1823

## 2020-03-16 ENCOUNTER — Observation Stay
Admit: 2020-03-16 | Discharge: 2020-03-16 | Disposition: A | Discharge status: Home / Self Care | Payer: Medicare HMO | Attending: Internal Medicine | Admitting: Internal Medicine

## 2020-03-16 DIAGNOSIS — Z853 Personal history of malignant neoplasm of breast: Secondary | ICD-10-CM | POA: Diagnosis not present

## 2020-03-16 DIAGNOSIS — R0602 Shortness of breath: Secondary | ICD-10-CM | POA: Diagnosis present

## 2020-03-16 DIAGNOSIS — Z7982 Long term (current) use of aspirin: Secondary | ICD-10-CM | POA: Diagnosis not present

## 2020-03-16 DIAGNOSIS — Z881 Allergy status to other antibiotic agents status: Secondary | ICD-10-CM | POA: Diagnosis not present

## 2020-03-16 DIAGNOSIS — I4892 Unspecified atrial flutter: Secondary | ICD-10-CM | POA: Diagnosis present

## 2020-03-16 DIAGNOSIS — I4891 Unspecified atrial fibrillation: Secondary | ICD-10-CM

## 2020-03-16 DIAGNOSIS — G250 Essential tremor: Secondary | ICD-10-CM | POA: Diagnosis present

## 2020-03-16 DIAGNOSIS — I480 Paroxysmal atrial fibrillation: Secondary | ICD-10-CM | POA: Diagnosis present

## 2020-03-16 DIAGNOSIS — J449 Chronic obstructive pulmonary disease, unspecified: Secondary | ICD-10-CM | POA: Diagnosis present

## 2020-03-16 DIAGNOSIS — R001 Bradycardia, unspecified: Secondary | ICD-10-CM | POA: Diagnosis not present

## 2020-03-16 DIAGNOSIS — R4781 Slurred speech: Secondary | ICD-10-CM | POA: Diagnosis not present

## 2020-03-16 DIAGNOSIS — I5031 Acute diastolic (congestive) heart failure: Secondary | ICD-10-CM | POA: Diagnosis present

## 2020-03-16 DIAGNOSIS — G2581 Restless legs syndrome: Secondary | ICD-10-CM | POA: Diagnosis present

## 2020-03-16 DIAGNOSIS — Z66 Do not resuscitate: Secondary | ICD-10-CM | POA: Diagnosis present

## 2020-03-16 DIAGNOSIS — E1169 Type 2 diabetes mellitus with other specified complication: Secondary | ICD-10-CM | POA: Diagnosis present

## 2020-03-16 DIAGNOSIS — Z20822 Contact with and (suspected) exposure to covid-19: Secondary | ICD-10-CM | POA: Diagnosis present

## 2020-03-16 DIAGNOSIS — F32A Depression, unspecified: Secondary | ICD-10-CM | POA: Diagnosis present

## 2020-03-16 DIAGNOSIS — F419 Anxiety disorder, unspecified: Secondary | ICD-10-CM | POA: Diagnosis present

## 2020-03-16 DIAGNOSIS — Z87891 Personal history of nicotine dependence: Secondary | ICD-10-CM | POA: Diagnosis not present

## 2020-03-16 DIAGNOSIS — E785 Hyperlipidemia, unspecified: Secondary | ICD-10-CM | POA: Diagnosis present

## 2020-03-16 DIAGNOSIS — Z8673 Personal history of transient ischemic attack (TIA), and cerebral infarction without residual deficits: Secondary | ICD-10-CM | POA: Diagnosis not present

## 2020-03-16 DIAGNOSIS — I081 Rheumatic disorders of both mitral and tricuspid valves: Secondary | ICD-10-CM | POA: Diagnosis present

## 2020-03-16 DIAGNOSIS — I110 Hypertensive heart disease with heart failure: Secondary | ICD-10-CM | POA: Diagnosis present

## 2020-03-16 DIAGNOSIS — Z79899 Other long term (current) drug therapy: Secondary | ICD-10-CM | POA: Diagnosis not present

## 2020-03-16 DIAGNOSIS — Z7984 Long term (current) use of oral hypoglycemic drugs: Secondary | ICD-10-CM | POA: Diagnosis not present

## 2020-03-16 LAB — ECHOCARDIOGRAM COMPLETE
AR max vel: 1.34 cm2
AV Area VTI: 1.46 cm2
AV Area mean vel: 1.39 cm2
AV Mean grad: 4 mmHg
AV Peak grad: 8.8 mmHg
Ao pk vel: 1.48 m/s
Area-P 1/2: 3.42 cm2
Height: 62 in
S' Lateral: 3.92 cm
Weight: 3680 oz

## 2020-03-16 LAB — BASIC METABOLIC PANEL
Anion gap: 9 (ref 5–15)
BUN: 12 mg/dL (ref 8–23)
CO2: 26 mmol/L (ref 22–32)
Calcium: 9 mg/dL (ref 8.9–10.3)
Chloride: 103 mmol/L (ref 98–111)
Creatinine, Ser: 0.91 mg/dL (ref 0.44–1.00)
GFR, Estimated: >60 mL/min (ref >60)
Glucose, Bld: 112 mg/dL — ABNORMAL HIGH (ref 70–99)
Potassium: 3.8 mmol/L (ref 3.5–5.1)
Sodium: 138 mmol/L (ref 135–145)

## 2020-03-16 LAB — CBG MONITORING, ED
Glucose-Capillary: 129 mg/dL — ABNORMAL HIGH (ref 70–99)
Glucose-Capillary: 131 mg/dL — ABNORMAL HIGH (ref 70–99)

## 2020-03-16 LAB — CBC
HCT: 31.5 % — ABNORMAL LOW (ref 36.0–46.0)
Hemoglobin: 10 g/dL — ABNORMAL LOW (ref 12.0–15.0)
MCH: 29.4 pg (ref 26.0–34.0)
MCHC: 31.7 g/dL (ref 30.0–36.0)
MCV: 92.6 fL (ref 80.0–100.0)
Platelets: 198 10*3/uL (ref 150–400)
RBC: 3.4 MIL/uL — ABNORMAL LOW (ref 3.87–5.11)
RDW: 17 % — ABNORMAL HIGH (ref 11.5–15.5)
WBC: 8.6 10*3/uL (ref 4.0–10.5)
nRBC: 0 % (ref 0.0–0.2)

## 2020-03-16 LAB — TSH: TSH: 1.537 u[IU]/mL (ref 0.350–4.500)

## 2020-03-16 LAB — GLUCOSE, CAPILLARY
Glucose-Capillary: 142 mg/dL — ABNORMAL HIGH (ref 70–99)
Glucose-Capillary: 141 mg/dL — ABNORMAL HIGH (ref 70–99)

## 2020-03-16 LAB — MAGNESIUM: Magnesium: 1.1 mg/dL — ABNORMAL LOW (ref 1.7–2.4)

## 2020-03-16 LAB — HEMOGLOBIN A1C
Hgb A1c MFr Bld: 6.1 % — ABNORMAL HIGH (ref 4.8–5.6)
Mean Plasma Glucose: 128.37 mg/dL

## 2020-03-16 LAB — TROPONIN I (HIGH SENSITIVITY): Troponin I (High Sensitivity): 11 ng/L (ref <18)

## 2020-03-16 MED ORDER — PERFLUTREN LIPID MICROSPHERE
1.0000 mL | INTRAVENOUS | Status: AC | PRN
  Administered 2020-03-16: 2 mL via INTRAVENOUS
  Filled 2020-03-16: qty 10

## 2020-03-16 NOTE — Plan of Care (Signed)
  Problem: Education: Goal: Knowledge of General Education information will improve Description Including pain rating scale, medication(s)/side effects and non-pharmacologic comfort measures Outcome: Progressing   

## 2020-03-16 NOTE — Consult Note (Signed)
[facility] Cardiology  CARDIOLOGY CONSULT NOTE  Patient ID: Alexa Lowe MRN: [id number] DOB/AGE: 1930/04/23 86 y.o.  Admit date: 03/15/2020 Referring Physician [place] Primary Physician [place] Primary Cardiologist Sarkis Rhines Reason for Consultation atrial fibrillation and acute diastolic congestive heart failure  HPI: 86 year old female referred for evaluation of atrial fibrillation and acute diastolic congestive heart failure.  Patient reports 1 month history of worsening exertional dyspnea and peripheral edema.  He went to outpatient orthopedic clinic yesterday to receive an injection in her right shoulder started to experience shortness of breath.  He was sent to the urgent care where she was noted to be in atrial fibrillation and was transferred to [facility] ED.  ECG revealed atrial fibrillation with controlled ventricular rate.  Chest x-ray revealed small bilateral pleural effusions.  Admission labs notable for BNP of 250.9 and high-sensitivity troponin of 12 and 11.  The patient was treated with intravenous furosemide with modest clinical improvement.  Oxygen saturation remains normal on room air.  2D echocardiogram 08/25/2018 revealed LVEF 55 to 60%.  Review of systems complete and found to be negative unless listed above     Past Medical History:  Diagnosis Date  . Cancer [facility])    Right breast  . COPD (chronic obstructive pulmonary disease) ([place])   . Diabetes mellitus without complication ([place])   . Hypertension     Past Surgical History:  Procedure Laterality Date  . ABDOMINAL SURGERY    . APPENDECTOMY    . BACK SURGERY    . BREAST SURGERY    . CHOLECYSTECTOMY    . ESOPHAGOGASTRODUODENOSCOPY N/A 08/31/2018   Procedure: ESOPHAGOGASTRODUODENOSCOPY (EGD);  Surgeon: Lin Landsman, MD;  Location: [facility] ENDOSCOPY;  Service: Gastroenterology;  Laterality: N/A;  . ESOPHAGOGASTRODUODENOSCOPY (EGD) WITH PROPOFOL N/A 05/20/2018   Procedure: ESOPHAGOGASTRODUODENOSCOPY (EGD) WITH PROPOFOL;   Surgeon: Toledo, Benay Pike, MD;  Location: [facility] ENDOSCOPY;  Service: Gastroenterology;  Laterality: N/A;  . ESOPHAGOGASTRODUODENOSCOPY (EGD) WITH PROPOFOL N/A 12/17/2018   Procedure: ESOPHAGOGASTRODUODENOSCOPY (EGD) WITH PROPOFOL;  Surgeon: Lin Landsman, MD;  Location: [facility] ENDOSCOPY;  Service: Gastroenterology;  Laterality: N/A;    (Not in a hospital admission)  Social History   Socioeconomic History  . Marital status: Widowed    Spouse name: Not on file  . Number of children: Not on file  . Years of education: Not on file  . Highest education level: Not on file  Occupational History  . Not on file  Tobacco Use  . Smoking status: Former Research scientist (life sciences)  . Smokeless tobacco: Never Used  Substance and Sexual Activity  . Alcohol use: No  . Drug use: No  . Sexual activity: Not Currently  Other Topics Concern  . Not on file  Social History Narrative   Lives alone in own apartment and uses a walker, still drives,or uses transport.   Social Determinants of Health   Financial Resource Strain: Not on file  Food Insecurity: Not on file  Transportation Needs: Not on file  Physical Activity: Not on file  Stress: Not on file  Social Connections: Not on file  Intimate Partner Violence: Not on file    Family History  Problem Relation Age of Onset  . CAD Father       Review of systems complete and found to be negative unless listed above      PHYSICAL EXAM  General: Well developed, well nourished, in no acute distress HEENT:  Normocephalic and atramatic Neck:  No JVD.  Lungs: Clear bilaterally to auscultation and percussion. Heart: HRRR .  Normal S1 and S2 without gallops or murmurs.  Abdomen: Bowel sounds are positive, abdomen soft and non-tender  Msk:  Back normal, normal gait. Normal strength and tone for age. Extremities: No clubbing, cyanosis or edema.   Neuro: Alert and oriented X 3. Psych:  Good affect, responds appropriately  Labs:   Lab Results  Component Value  Date   WBC 8.6 03/16/2020   HGB 10.0 (L) 03/16/2020   HCT 31.5 (L) 03/16/2020   MCV 92.6 03/16/2020   PLT 198 03/16/2020    Recent Labs  Lab 03/16/20 0415  NA 138  K 3.8  CL 103  CO2 26  BUN 12  CREATININE 0.91  CALCIUM 9.0  GLUCOSE 112*   Lab Results  Component Value Date   CKTOTAL 27 05/06/2012   CKMB < 0.5 (L) 05/06/2012   TROPONINI <0.03 06/15/2018    Lab Results  Component Value Date   CHOL 117 08/27/2018   CHOL 112 08/25/2018   CHOL 133 06/29/2011   Lab Results  Component Value Date   HDL 33 (L) 08/27/2018   HDL 42 08/25/2018   HDL 44 06/29/2011   Lab Results  Component Value Date   LDLCALC 69 08/27/2018   LDLCALC 60 08/25/2018   LDLCALC 63 06/29/2011   Lab Results  Component Value Date   TRIG 75 08/27/2018   TRIG 52 08/25/2018   TRIG 130 06/29/2011   Lab Results  Component Value Date   CHOLHDL 3.5 08/27/2018   CHOLHDL 2.7 08/25/2018   No results found for: LDLDIRECT    Radiology: DG Chest 2 View  Result Date: 03/15/2020 CLINICAL DATA:  Short of breath EXAM: CHEST - 2 VIEW COMPARISON:  06/16/2019 FINDINGS: Small bilateral pleural effusions. Mild airspace disease at the bases presumably atelectasis. Normal cardiac size. No pneumothorax. Surgical hardware in the cervical spine IMPRESSION: Small bilateral pleural effusions with bibasilar atelectasis. Electronically Signed   By: Donavan Foil M.D.   On: 03/15/2020 17:49    EKG: Atrial fibrillation at a rate of 69 bpm  ASSESSMENT AND PLAN:   1.  Atrial fibrillation with controlled ventricular rate, chads vas score of 8, on metoprolol tartrate for rate control and started Eliquis for stroke prevention 2.  Acute diastolic congestive heart failure, clinically improved after IV furosemide 3.  History of CVA, status post punctate left parietal lobe infarct 08/2018 4.  History of GI bleed/gastric ulcer 05/19/2018, hemoglobin hematocrit 10 and 31.5, respectively  Recommendations  1.  Agree with overall  current therapy 2.  Continue diuresis 3.  Carefully monitor renal status 4.  Agree with Eliquis for stroke prevention 5.  Carefully monitor for bleeding side effects 6.  Review 2D echocardiogram 7.  Further recommendations pending 2D echocardiogram results  Signed: Isaias Cowman MD,PhD, [facility] 03/16/2020, 9:06 AM

## 2020-03-16 NOTE — ED Notes (Signed)
Admitting MD, Dr. Kurtis Bushman, leaving bedside requesting to speak to to Charge RN. Charge RN called. MD stating pt has numerous complaints. Charge RN stating he will come address concerns with pt

## 2020-03-16 NOTE — Progress Notes (Signed)
PROGRESS NOTE    Alexa Lowe  SWF:[id number] DOB: 1931/01/28 DOA: 03/15/2020 PCP: Dion Body, MD    Brief Narrative:  Alexa Lowe is a 86 y.o. female with medical history significant for COPD, T2DM, HTN, HLD, history of CVA, essential tremor, restless leg syndrome who presents to the ED for evaluation of shortness of breath and new onset atrial fibrillation.  12/9-pt feels a little better, cardiology was consulted.  Consultants:   cardiiology  Procedures:   Antimicrobials:       Subjective: Little better, less sob. No cp currently. Has multiple complaints about nsg staff, which I relayed the message to charge nurse in ED.  Objective: Vitals:   03/16/20 0600 03/16/20 0615 03/16/20 0700 03/16/20 0800  BP:  126/68 (!) 149/63 (!) 160/93  Pulse:  (!) 52 60 (!) 54  Resp:  15 14 13   Temp:      TempSrc:      SpO2: 97% 94% 97% 95%  Weight:      Height:        Intake/Output Summary (Last 24 hours) at 03/16/2020 0842 Last data filed at 03/16/2020 0304 Gross per 24 hour  Intake --  Output 2975 ml  Net -2975 ml   Filed Weights   03/15/20 1858  Weight: 104.3 kg    Examination:  General exam: Appears calm and comfortable  Respiratory system: Clear to auscultation. Respiratory effort normal. Cardiovascular system: S1 & S2 heard, irreg-reg. No JVD, murmurs, rubs, gallops or clicks.  Gastrointestinal system: Abdomen is nondistended, soft and nontender. . Normal bowel sounds heard. Central nervous system: Alert and oriented. grosslly intact Extremities: mild b/l edema Skin: warm, dry Psychiatry: Judgement and insight appear normal. Mood & affect appropriate.     Data Reviewed: I have personally reviewed following labs and imaging studies  CBC: Recent Labs  Lab 03/15/20 1859 03/16/20 0415  WBC 8.9 8.6  NEUTROABS 7.2  --   HGB 10.5* 10.0*  HCT 33.3* 31.5*  MCV 93.5 92.6  PLT 198 220   Basic Metabolic Panel: Recent Labs  Lab 03/15/20 1859  03/15/20 2224 03/16/20 0415  NA 145  --  138  K 3.5  --  3.8  CL 110  --  103  CO2 24  --  26  GLUCOSE 121*  --  112*  BUN 11  --  12  CREATININE 0.89  --  0.91  CALCIUM 8.8*  --  9.0  MG  --  1.1*  --    GFR: Estimated Creatinine Clearance: 49.4 mL/min (by C-G formula based on SCr of 0.91 mg/dL). Liver Function Tests: No results for input(s): AST, ALT, ALKPHOS, BILITOT, PROT, ALBUMIN in the last 168 hours. No results for input(s): LIPASE, AMYLASE in the last 168 hours. No results for input(s): AMMONIA in the last 168 hours. Coagulation Profile: No results for input(s): INR, PROTIME in the last 168 hours. Cardiac Enzymes: No results for input(s): CKTOTAL, CKMB, CKMBINDEX, TROPONINI in the last 168 hours. BNP (last 3 results) No results for input(s): PROBNP in the last 8760 hours. HbA1C: No results for input(s): HGBA1C in the last 72 hours. CBG: Recent Labs  Lab 03/15/20 2223 03/16/20 0749  GLUCAP 199* 129*   Lipid Profile: No results for input(s): CHOL, HDL, LDLCALC, TRIG, CHOLHDL, LDLDIRECT in the last 72 hours. Thyroid Function Tests: Recent Labs    03/15/20 2224  TSH 1.537   Anemia Panel: No results for input(s): VITAMINB12, FOLATE, FERRITIN, TIBC, IRON, RETICCTPCT in the last 72  hours. Sepsis Labs: No results for input(s): PROCALCITON, LATICACIDVEN in the last 168 hours.  Recent Results (from the past 240 hour(s))  Resp Panel by RT-PCR (Flu A&B, Covid) Nasopharyngeal Swab     Status: None   Collection Time: 03/15/20  6:59 PM   Specimen: Nasopharyngeal Swab; Nasopharyngeal(NP) swabs in vial transport medium  Result Value Ref Range Status   SARS Coronavirus 2 by RT PCR NEGATIVE NEGATIVE Final    Comment: (NOTE) SARS-CoV-2 target nucleic acids are NOT DETECTED.  The SARS-CoV-2 RNA is generally detectable in upper respiratory specimens during the acute phase of infection. The lowest concentration of SARS-CoV-2 viral copies this assay can detect is 138  copies/mL. A negative result does not preclude SARS-Cov-2 infection and should not be used as the sole basis for treatment or other patient management decisions. A negative result may occur with  improper specimen collection/handling, submission of specimen other than nasopharyngeal swab, presence of viral mutation(s) within the areas targeted by this assay, and inadequate number of viral copies(<138 copies/mL). A negative result must be combined with clinical observations, patient history, and epidemiological information. The expected result is Negative.  Fact Sheet for Patients:  EntrepreneurPulse.com.au  Fact Sheet for Healthcare Providers:  IncredibleEmployment.be  This test is no t yet approved or cleared by the [place] FDA and  has been authorized for detection and/or diagnosis of SARS-CoV-2 by FDA under an Emergency Use Authorization (EUA). This EUA will remain  in effect (meaning this test can be used) for the duration of the COVID-19 declaration under Section 564(b)(1) of the Act, 21 U.S.C.section 360bbb-3(b)(1), unless the authorization is terminated  or revoked sooner.       Influenza A by PCR NEGATIVE NEGATIVE Final   Influenza B by PCR NEGATIVE NEGATIVE Final    Comment: (NOTE) The Xpert Xpress SARS-CoV-2/FLU/RSV plus assay is intended as an aid in the diagnosis of influenza from Nasopharyngeal swab specimens and should not be used as a sole basis for treatment. Nasal washings and aspirates are unacceptable for Xpert Xpress SARS-CoV-2/FLU/RSV testing.  Fact Sheet for Patients: EntrepreneurPulse.com.au  Fact Sheet for Healthcare Providers: IncredibleEmployment.be  This test is not yet approved or cleared by the [place] FDA and has been authorized for detection and/or diagnosis of SARS-CoV-2 by FDA under an Emergency Use Authorization (EUA). This EUA will remain in effect (meaning  this test can be used) for the duration of the COVID-19 declaration under Section 564(b)(1) of the Act, 21 U.S.C. section 360bbb-3(b)(1), unless the authorization is terminated or revoked.  Performed at [facility], [address], [place], [place] [address]          Radiology Studies: DG Chest 2 View  Result Date: 03/15/2020 CLINICAL DATA:  Short of breath EXAM: CHEST - 2 VIEW COMPARISON:  06/16/2019 FINDINGS: Small bilateral pleural effusions. Mild airspace disease at the bases presumably atelectasis. Normal cardiac size. No pneumothorax. Surgical hardware in the cervical spine IMPRESSION: Small bilateral pleural effusions with bibasilar atelectasis. Electronically Signed   By: Donavan Foil M.D.   On: 03/15/2020 17:49        Scheduled Meds: . apixaban  5 mg Oral BID  . atorvastatin  40 mg Oral QHS  . citalopram  20 mg Oral Daily  . furosemide  40 mg Intravenous Q12H  . gabapentin  300 mg Oral TID  . insulin aspart  0-9 Units Subcutaneous TID WC  . losartan  50 mg Oral Daily  . metoprolol tartrate  12.5 mg Oral  BID  . pantoprazole  40 mg Oral Daily  . potassium chloride  20 mEq Oral Daily  . primidone  25 mg Oral BID  . rOPINIRole  0.25 mg Oral QHS   Continuous Infusions:  Assessment & Plan:   Principal Problem:   Atrial fibrillation with RVR (HCC) Active Problems:   Hypertension associated with diabetes ([place])   Diabetes (HCC)   COPD (chronic obstructive pulmonary disease) (HCC)   Hyperlipidemia associated with type 2 diabetes mellitus ([place])   History of CVA (cerebrovascular accident)   Acute CHF (congestive heart failure) ([place])   Alexa Lowe is a 86 y.o. female with medical history significant for COPD, T2DM, HTN, HLD, history of CVA, essential tremor, restless leg syndrome who is admitted with new onset atrial fibrillation.  Atrial fibrillation/flutter with RVR: -New onset rate has improved  Chads vas score of 8  Currently on metoprolol  for rate control  Continue Eliquis for stroke prevention  Cardiology following  Echo EF 88-50%    Acute diastolic HF: Mildly was overloaded, clinically improved after iv lasix Will continue iv lasix I/o Daily weight   History of CVA: Punctate left parietal lobe infarct May 2020.  Per prior neurology note, concern for embolic etiology but echocardiogram was without abnormality at that time. -12/9-asa held, since starting Eliquis Continue statin   Type 2 diabetes: -hold metformin Continue RISS   Hypertension: At times elevated Continue ARB and beta blk Continue monitor   Hyperlipidemia: Continue atorvastatin.  COPD: Appears to be stable without wheezing on admission.  Continue albuterol as needed.  Essential tremor/restless leg syndrome: Continue primidone and ropinirole.  Depression/anxiety: Continue Celexa.   DVT prophylaxis: Eliquis Code Status: DNR Family Communication: None at bedside  Status is: inpatient  Due to IV medications required for current hospitalization    Dispo: The patient is from: Home              Anticipated d/c is to: Home              Anticipated d/c date is: 2 days              Patient currently is not medically stable to d/c.needs IV lasix            LOS: 0 days   Time spent: 35 minutes with more than 50% on [place], MD [facility] Pager [phone]  If 7PM-7AM, please contact night-coverage 03/16/2020, 8:42 AM

## 2020-03-16 NOTE — Progress Notes (Signed)
*  PRELIMINARY RESULTS* Echocardiogram 2D Echocardiogram has been performed.  Alexa Lowe 03/16/2020, 9:40 AM

## 2020-03-16 NOTE — ED Notes (Signed)
Responded to pt's call bell. PT requesting night meds. Offered melatonin but pt states she will take her requip and melatonin together. PT given cup of ice with a little bit of water.

## 2020-03-16 NOTE — ED Notes (Signed)
This RN on the phone with pt's daughter to update on POC

## 2020-03-16 NOTE — ED Notes (Signed)
Called pharmacy to request unverified meds

## 2020-03-16 NOTE — ED Notes (Signed)
Echo present at bedside

## 2020-03-16 NOTE — ED Notes (Signed)
Pt's bed wet, Purewick out of place. Bed and gown changed and Purewick replaced. Pt given snack of crackers and applesauce per request

## 2020-03-17 LAB — BASIC METABOLIC PANEL
Anion gap: 10 (ref 5–15)
BUN: 23 mg/dL (ref 8–23)
CO2: 31 mmol/L (ref 22–32)
Calcium: 9.2 mg/dL (ref 8.9–10.3)
Chloride: 96 mmol/L — ABNORMAL LOW (ref 98–111)
Creatinine, Ser: 1.05 mg/dL — ABNORMAL HIGH (ref 0.44–1.00)
GFR, Estimated: 51 mL/min — ABNORMAL LOW (ref >60)
Glucose, Bld: 119 mg/dL — ABNORMAL HIGH (ref 70–99)
Potassium: 3.7 mmol/L (ref 3.5–5.1)
Sodium: 137 mmol/L (ref 135–145)

## 2020-03-17 LAB — CREATININE, SERUM
Creatinine, Ser: 1.28 mg/dL — ABNORMAL HIGH (ref 0.44–1.00)
GFR, Estimated: 41 mL/min — ABNORMAL LOW (ref >60)

## 2020-03-17 LAB — GLUCOSE, CAPILLARY
Glucose-Capillary: 142 mg/dL — ABNORMAL HIGH (ref 70–99)
Glucose-Capillary: 119 mg/dL — ABNORMAL HIGH (ref 70–99)
Glucose-Capillary: 131 mg/dL — ABNORMAL HIGH (ref 70–99)
Glucose-Capillary: 198 mg/dL — ABNORMAL HIGH (ref 70–99)

## 2020-03-17 LAB — BRAIN NATRIURETIC PEPTIDE: B Natriuretic Peptide: 191.7 pg/mL — ABNORMAL HIGH (ref 0.0–100.0)

## 2020-03-17 MED ORDER — FUROSEMIDE 10 MG/ML IJ SOLN: 20.0000 mg | Freq: Every day | INTRAMUSCULAR | Status: DC

## 2020-03-17 MED ORDER — METOPROLOL SUCCINATE ER 25 MG PO TB24
12.5000 mg | ORAL_TABLET | Freq: Every day | ORAL | Status: DC
  Filled 2020-03-17 (×3): qty 1

## 2020-03-17 MED ORDER — FUROSEMIDE 10 MG/ML IJ SOLN: 40.0000 mg | Freq: Every day | INTRAMUSCULAR | Status: DC

## 2020-03-17 MED ORDER — APIXABAN 2.5 MG PO TABS
2.5000 mg | ORAL_TABLET | Freq: Two times a day (BID) | ORAL | Status: DC
  Administered 2020-03-17 – 2020-03-22 (×10): 2.5 mg via ORAL
  Filled 2020-03-17 (×10): qty 1

## 2020-03-17 MED ORDER — MAGNESIUM SULFATE 2 GM/50ML IV SOLN
2.0000 g | Freq: Once | INTRAVENOUS | Status: AC
  Administered 2020-03-17: 2 g via INTRAVENOUS
  Filled 2020-03-17: qty 50

## 2020-03-17 NOTE — Progress Notes (Signed)
Spoke with patient's daughter, [name], on the phone about the patient's hospitalization, including heart failure, atrial fibrillation, anticoagulation, her increased falls risk, increased bleeding risk, and home health or assisted living after discharge. The patient's daughter would like the patient to be on anticoagulation as she has a history of a previous stroke, but should would feel more comfortable with a lower dose. We will reduce the dose of Eliquis to 2.5 mg BID due to patient's increased fall and bleeding risk and advanced age. The daughter would like to speak with social work further. Phone number to telemetry provided.

## 2020-03-17 NOTE — Consult Note (Addendum)
   Heart Failure Nurse Navigator Note:  HFpEF 50-55%  She was seen initially at her orthopod's office for a steroid injection of her right shoulder.  She was noted to be very short of breath and weak.  From there she was recommended to seek further evaluation at urgent care.  Due to concern of new onset heart failure as well as atrial fibrillation she was directed to the ED.  She has had  worsening of dyspnea on exertion for 3 weeks.  She also noted swelling of her lower extremities and abdomen and PND.  Also complained of intermittent chest discomfort.  Co morbidities:  COPD Type 2 diabetes Hyperlipidemia    History of CVA  Medication:  Apixaban 5 mg twice a day Lipitor 40 mg daily Cozaar 50 mg daily Metoprolol tartrate 12-1/2 mg twice daily   Lasix is on hold.   Labs:  Sodium 137, potassium 3.7, chloride 96, CO2 31, BUN 23 up from 12 yesterday and creatinine 1.05 up from 0.91 of yesterday.  BNP 250 admission today is 191. Intake 240 mL Output 4450 mL( she has put out 7185 mL since admission)  Blood pressure 132/61 with a pulse of 52, in reviewing her telemetry he is in sinus bradycardic  rhythm with occasional PVC. Weight is 95.9 kg(98.9) BMI 38.67    Assessment:  General-she is awake and alert lying in bed watching television.  States that she notes that she still gets short of breath with conversation.  She also feels that her abdomen is still swollen but not as much as it had been and her legs remain edematous.  Also has noted a left-sided headache not relieved with Tylenol given earlier this morning.   HEENT-pupils are equal, no JVD.  Cardiac-heart tones are regular rate and rhythm, S1-S2.   Chest-posterior auscultation breath sounds are diminished on the right base.     Abdomen rounded soft non-tender.   Musculoskeletal-1+ pitting edema.  Neurologic-speech is clear moves all extremities.     Initial visit with patient this morning.  States that she  has not had anything like this happen to her before.  She had noted PND, early satiety, abdominal swelling and her close fitting tighter than normal in the waist, along with lower extremity edema.  Discussed the symptoms that she was exhibiting from here on out very important to report to her physician along with the importance of weighing daily; reporting a 2 to 3 pound weight gain in a day or 5 pounds within a week.  She voices understanding.   Also discussed her living situation.  She lives by herself and friends bring in meals.  Has 1 friend that is very conscious of not using salt when she cooks.  Discussed  not using salt at the table.  States that she uses a wheelchair to get around home.  She states that she has a scale at home but is not sure if it is working properly.  Told her that we will contact the care manager about getting her a new scale for home.   She was given heart failure teaching booklet along with his own magnet that was discussed and also the handout for getting through the holidays.  Plans to view the heart failure videos later this afternoon.  Pricilla Riffle RN, CHFN

## 2020-03-17 NOTE — Plan of Care (Signed)
Tylenol given for headache. SB on tele. HR gets as low as 47-49 but usually is in the 50s when sleeping. No new changes with care. Will continue with the current plan of care.  Problem: Education: Goal: Knowledge of General Education information will improve Description: Including pain rating scale, medication(s)/side effects and non-pharmacologic comfort measures Outcome: Progressing   Problem: Health Behavior/Discharge Planning: Goal: Ability to manage health-related needs will improve Outcome: Progressing   Problem: Clinical Measurements: Goal: Ability to maintain clinical measurements within normal limits will improve Outcome: Progressing Goal: Will remain free from infection Outcome: Progressing Goal: Diagnostic test results will improve Outcome: Progressing Goal: Respiratory complications will improve Outcome: Progressing Goal: Cardiovascular complication will be avoided Outcome: Progressing

## 2020-03-17 NOTE — Progress Notes (Signed)
PROGRESS NOTE    Alexa Lowe  DZH:[id number] DOB: March 25, 1931 DOA: 03/15/2020 PCP: Dion Body, MD    Brief Narrative:  Alexa Lowe is a 86 y.o. female with medical history significant for COPD, T2DM, HTN, HLD, history of CVA, essential tremor, restless leg syndrome who presents to the ED for evaluation of shortness of breath and new onset atrial fibrillation.  12/9-pt feels a little better, cardiology was consulted 12/10-bradycardic on monitor. Lying in bed not sure if gets dizzy. Did not receive beta blk this am.   Consultants:   cardiology  Procedures:   Antimicrobials:       Subjective: Lying in bed, no sob, cp, dizziness at rest. Mostly on wheelchair at home  Objective: Vitals:   03/16/20 1456 03/16/20 1952 03/16/20 2123 03/17/20 0406  BP: (!) 138/92 (!) 114/40 (!) 129/58 (!) 126/54  Pulse: (!) 55 (!) 55  (!) 49  Resp: 18   17  Temp: 98 F (36.7 C) 98.4 F (36.9 C)  98.3 F (36.8 C)  TempSrc: Oral Oral  Oral  SpO2: 95% 92%  94%  Weight: 98.9 kg   95.9 kg  Height: 5\' 2"  (1.575 m)       Intake/Output Summary (Last 24 hours) at 03/17/2020 0809 Last data filed at 03/17/2020 4196 Gross per 24 hour  Intake 240 ml  Output 4450 ml  Net -4210 ml   Filed Weights   03/15/20 1858 03/16/20 1456 03/17/20 0406  Weight: 104.3 kg 98.9 kg 95.9 kg    Examination: Lying in bed comfortably, NAD CTA, no wheeze rales rhonchi Regular/irregular, S1-S2 Soft benign positive bowel sounds Decrease edema Alert oriented x3, grossly intact Mood and affect appropriate in current setting       Data Reviewed: I have personally reviewed following labs and imaging studies  CBC: Recent Labs  Lab 03/15/20 1859 03/16/20 0415  WBC 8.9 8.6  NEUTROABS 7.2  --   HGB 10.5* 10.0*  HCT 33.3* 31.5*  MCV 93.5 92.6  PLT 198 222   Basic Metabolic Panel: Recent Labs  Lab 03/15/20 1859 03/15/20 2224 03/16/20 0415 03/17/20 0427  NA 145  --  138 137  K 3.5  --   3.8 3.7  CL 110  --  103 96*  CO2 24  --  26 31  GLUCOSE 121*  --  112* 119*  BUN 11  --  12 23  CREATININE 0.89  --  0.91 1.05*  CALCIUM 8.8*  --  9.0 9.2  MG  --  1.1*  --   --    GFR: Estimated Creatinine Clearance: 40.8 mL/min (A) (by C-G formula based on SCr of 1.05 mg/dL (H)). Liver Function Tests: No results for input(s): AST, ALT, ALKPHOS, BILITOT, PROT, ALBUMIN in the last 168 hours. No results for input(s): LIPASE, AMYLASE in the last 168 hours. No results for input(s): AMMONIA in the last 168 hours. Coagulation Profile: No results for input(s): INR, PROTIME in the last 168 hours. Cardiac Enzymes: No results for input(s): CKTOTAL, CKMB, CKMBINDEX, TROPONINI in the last 168 hours. BNP (last 3 results) No results for input(s): PROBNP in the last 8760 hours. HbA1C: Recent Labs    03/16/20 0415  HGBA1C 6.1*   CBG: Recent Labs  Lab 03/15/20 2223 03/16/20 0749 03/16/20 1151 03/16/20 1645 03/16/20 2059  GLUCAP 199* 129* 131* 142* 141*   Lipid Profile: No results for input(s): CHOL, HDL, LDLCALC, TRIG, CHOLHDL, LDLDIRECT in the last 72 hours. Thyroid Function Tests: Recent Labs  03/15/20 2224  TSH 1.537   Anemia Panel: No results for input(s): VITAMINB12, FOLATE, FERRITIN, TIBC, IRON, RETICCTPCT in the last 72 hours. Sepsis Labs: No results for input(s): PROCALCITON, LATICACIDVEN in the last 168 hours.  Recent Results (from the past 240 hour(s))  Resp Panel by RT-PCR (Flu A&B, Covid) Nasopharyngeal Swab     Status: None   Collection Time: 03/15/20  6:59 PM   Specimen: Nasopharyngeal Swab; Nasopharyngeal(NP) swabs in vial transport medium  Result Value Ref Range Status   SARS Coronavirus 2 by RT PCR NEGATIVE NEGATIVE Final    Comment: (NOTE) SARS-CoV-2 target nucleic acids are NOT DETECTED.  The SARS-CoV-2 RNA is generally detectable in upper respiratory specimens during the acute phase of infection. The lowest concentration of SARS-CoV-2 viral copies  this assay can detect is 138 copies/mL. A negative result does not preclude SARS-Cov-2 infection and should not be used as the sole basis for treatment or other patient management decisions. A negative result may occur with  improper specimen collection/handling, submission of specimen other than nasopharyngeal swab, presence of viral mutation(s) within the areas targeted by this assay, and inadequate number of viral copies(<138 copies/mL). A negative result must be combined with clinical observations, patient history, and epidemiological information. The expected result is Negative.  Fact Sheet for Patients:  EntrepreneurPulse.com.au  Fact Sheet for Healthcare Providers:  IncredibleEmployment.be  This test is no t yet approved or cleared by the [place] FDA and  has been authorized for detection and/or diagnosis of SARS-CoV-2 by FDA under an Emergency Use Authorization (EUA). This EUA will remain  in effect (meaning this test can be used) for the duration of the COVID-19 declaration under Section 564(b)(1) of the Act, 21 U.S.C.section 360bbb-3(b)(1), unless the authorization is terminated  or revoked sooner.       Influenza A by PCR NEGATIVE NEGATIVE Final   Influenza B by PCR NEGATIVE NEGATIVE Final    Comment: (NOTE) The Xpert Xpress SARS-CoV-2/FLU/RSV plus assay is intended as an aid in the diagnosis of influenza from Nasopharyngeal swab specimens and should not be used as a sole basis for treatment. Nasal washings and aspirates are unacceptable for Xpert Xpress SARS-CoV-2/FLU/RSV testing.  Fact Sheet for Patients: EntrepreneurPulse.com.au  Fact Sheet for Healthcare Providers: IncredibleEmployment.be  This test is not yet approved or cleared by the [place] FDA and has been authorized for detection and/or diagnosis of SARS-CoV-2 by FDA under an Emergency Use Authorization (EUA). This EUA  will remain in effect (meaning this test can be used) for the duration of the COVID-19 declaration under Section 564(b)(1) of the Act, 21 U.S.C. section 360bbb-3(b)(1), unless the authorization is terminated or revoked.  Performed at [facility], [address], [place], [place] [address]          Radiology Studies: DG Chest 2 View  Result Date: 03/15/2020 CLINICAL DATA:  Short of breath EXAM: CHEST - 2 VIEW COMPARISON:  06/16/2019 FINDINGS: Small bilateral pleural effusions. Mild airspace disease at the bases presumably atelectasis. Normal cardiac size. No pneumothorax. Surgical hardware in the cervical spine IMPRESSION: Small bilateral pleural effusions with bibasilar atelectasis. Electronically Signed   By: Donavan Foil M.D.   On: 03/15/2020 17:49   ECHOCARDIOGRAM COMPLETE  Result Date: 03/16/2020    ECHOCARDIOGRAM REPORT   Patient Name:   Alexa Lowe Date of Exam: 03/16/2020 Medical Rec #:  [id number]       Height:       62.0 in Accession #:  [id number]      Weight:       230.0 lb Date of Birth:  1930/04/23       BSA:          2.029 m Patient Age:    46 years        BP:           149/63 mmHg Patient Gender: F               HR:           62 bpm. Exam Location:  [facility] Procedure: 2D Echo, Color Doppler, Cardiac Doppler and Intracardiac            Opacification Agent Indications:     I48.91 Atrial fibrillation  History:         Patient has prior history of Echocardiogram examinations, most                  recent 08/25/2018. COPD; Risk Factors:Hypertension and Diabetes.  Sonographer:     Charmayne Sheer RDCS (AE) Referring Phys:  [id number] Cleaster Corin PATEL Diagnosing Phys: Isaias Cowman MD  Sonographer Comments: Suboptimal apical window and suboptimal subcostal window. Image acquisition challenging due to patient body habitus, Image acquisition challenging due to mastectomy and Image acquisition challenging due to COPD. IMPRESSIONS  1. Left ventricular ejection fraction, by  estimation, is 50 to 55%. The left ventricle has low normal function. The left ventricle has no regional wall motion abnormalities. Left ventricular diastolic parameters were normal.  2. Right ventricular systolic function is normal. The right ventricular size is normal.  3. Right atrial size was moderately dilated.  4. The mitral valve is normal in structure. Mild to moderate mitral valve regurgitation. No evidence of mitral stenosis.  5. Tricuspid valve regurgitation is mild to moderate.  6. The aortic valve is normal in structure. Aortic valve regurgitation is not visualized. No aortic stenosis is present.  7. The inferior vena cava is normal in size with greater than 50% respiratory variability, suggesting right atrial pressure of 3 mmHg. FINDINGS  Left Ventricle: Left ventricular ejection fraction, by estimation, is 50 to 55%. The left ventricle has low normal function. The left ventricle has no regional wall motion abnormalities. Definity contrast agent was given IV to delineate the left ventricular endocardial borders. The left ventricular internal cavity size was normal in size. There is no left ventricular hypertrophy. Left ventricular diastolic parameters were normal. Right Ventricle: The right ventricular size is normal. No increase in right ventricular wall thickness. Right ventricular systolic function is normal. Left Atrium: Left atrial size was normal in size. Right Atrium: Right atrial size was moderately dilated. Pericardium: There is no evidence of pericardial effusion. Mitral Valve: The mitral valve is normal in structure. Mild to moderate mitral valve regurgitation. No evidence of mitral valve stenosis. MV peak gradient, 5.1 mmHg. The mean mitral valve gradient is 2.0 mmHg. Tricuspid Valve: The tricuspid valve is normal in structure. Tricuspid valve regurgitation is mild to moderate. No evidence of tricuspid stenosis. Aortic Valve: The aortic valve is normal in structure. Aortic valve  regurgitation is not visualized. No aortic stenosis is present. Aortic valve mean gradient measures 4.0 mmHg. Aortic valve peak gradient measures 8.8 mmHg. Aortic valve area, by VTI measures 1.46 cm. Pulmonic Valve: The pulmonic valve was normal in structure. Pulmonic valve regurgitation is not visualized. No evidence of pulmonic stenosis. Aorta: The aortic root is normal in size and structure. Venous: The inferior vena cava is normal  in size with greater than 50% respiratory variability, suggesting right atrial pressure of 3 mmHg. IAS/Shunts: No atrial level shunt detected by color flow Doppler.  LEFT VENTRICLE PLAX 2D LVIDd:         5.34 cm  Diastology LVIDs:         3.92 cm  LV e' medial:    8.05 cm/s LV PW:         0.87 cm  LV E/e' medial:  14.5 LV IVS:        0.72 cm  LV e' lateral:   6.74 cm/s LVOT diam:     1.70 cm  LV E/e' lateral: 17.4 LV SV:         49 LV SV Index:   24 LVOT Area:     2.27 cm  LEFT ATRIUM             Index LA diam:        4.20 cm 2.07 cm/m LA Vol (A2C):   34.0 ml 16.76 ml/m LA Vol (A4C):   64.2 ml 31.65 ml/m LA Biplane Vol: 51.9 ml 25.58 ml/m  AORTIC VALVE                   PULMONIC VALVE AV Area (Vmax):    1.34 cm    PV Vmax:       1.31 m/s AV Area (Vmean):   1.39 cm    PV Vmean:      79.600 cm/s AV Area (VTI):     1.46 cm    PV VTI:        0.258 m AV Vmax:           148.00 cm/s PV Peak grad:  6.9 mmHg AV Vmean:          91.300 cm/s PV Mean grad:  3.0 mmHg AV VTI:            0.334 m AV Peak Grad:      8.8 mmHg AV Mean Grad:      4.0 mmHg LVOT Vmax:         87.50 cm/s LVOT Vmean:        56.000 cm/s LVOT VTI:          0.215 m LVOT/AV VTI ratio: 0.64  AORTA Ao Root diam: 2.90 cm MITRAL VALVE MV Area (PHT): 3.42 cm     SHUNTS MV Peak grad:  5.1 mmHg     Systemic VTI:  0.22 m MV Mean grad:  2.0 mmHg     Systemic Diam: 1.70 cm MV Vmax:       1.13 m/s MV Vmean:      58.4 cm/s MV Decel Time: 222 msec MV E velocity: 117.00 cm/s MV A velocity: 57.40 cm/s MV E/A ratio:  2.04 Isaias Cowman MD Electronically signed by Isaias Cowman MD Signature Date/Time: 03/16/2020/1:20:13 PM    Final         Scheduled Meds: . apixaban  5 mg Oral BID  . atorvastatin  40 mg Oral QHS  . citalopram  20 mg Oral Daily  . [START ON 03/18/2020] furosemide  20 mg Intravenous Daily  . gabapentin  300 mg Oral TID  . insulin aspart  0-9 Units Subcutaneous TID WC  . losartan  50 mg Oral Daily  . metoprolol tartrate  12.5 mg Oral BID  . pantoprazole  40 mg Oral Daily  . potassium chloride  20 mEq Oral Daily  . primidone  25 mg Oral  BID  . rOPINIRole  0.25 mg Oral QHS   Continuous Infusions: . magnesium sulfate bolus IVPB      Assessment & Plan:   Principal Problem:   Atrial fibrillation with RVR (HCC) Active Problems:   Hypertension associated with diabetes (HCC)   Diabetes (HCC)   COPD (chronic obstructive pulmonary disease) (HCC)   Hyperlipidemia associated with type 2 diabetes mellitus ([place])   History of CVA (cerebrovascular accident)   Acute CHF (congestive heart failure) (HCC)   PAF (paroxysmal atrial fibrillation) ([place])   Alexa Lowe is a 86 y.o. female with medical history significant for COPD, T2DM, HTN, HLD, history of CVA, essential tremor, restless leg syndrome who is admitted with new onset atrial fibrillation.  Atrial fibrillation/flutter with RVR: -New onset rate has improved  Chads vas score of 8  12/9-bradycardic at rest, but beta blk held. Looks like received 3 doses so far. Will decrease beta blk to toprol xl 12.5mg  qd with parameters Continue Eliquis for stroke prevention Echo EF 50 to 55% Cardiology following, will need outpatient follow-up    Acute diastolic HF: -More euvolemic on exam.  Received dose of Lasix at 6 AM today.   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  Per prior neurology note, concern for embolic etiology but echocardiogram  was without abnormality at that time. Aspirin held since on Eliquis Continue statins  Hypomagnesemia-mag 1.1 We will give 2 g magnesium IV x1 Monitor level   Type 2 diabetes: -hold metformin Continue RISS   Hypertension: At times elevated Continue ARB and beta blk Continue monitor   Hyperlipidemia: Continue atorvastatin.  COPD: Appears to be stable without acute exacerbation . Continue albuterol as needed.  Essential tremor/restless leg syndrome: Continue primidone and ropinirole.  Depression/anxiety: Continue Celexa.   DVT prophylaxis: Eliquis Code Status: DNR Family Communication: None at bedside  Status is: inpatient  Due to IV medications required for current hospitalization    Dispo: The patient is from: Home              Anticipated d/c is to: Home              Anticipated d/c date is: 1 day              Patient currently is not medically stable to d/c. need to adjust cardiac meds since very bradycardic. Likely dc home in am if stable            LOS: 1 day   Time spent: 35 minutes with more than 50% on [place], MD [facility] Pager [phone]  If 7PM-7AM, please contact night-coverage 03/17/2020, 8:09 AM

## 2020-03-17 NOTE — Progress Notes (Signed)
[facility] Cardiology    SUBJECTIVE: The patient reports feeling somewhat better this morning, but still feels short of breath with prolonged talking or movement. She denies significant chest pain. She reports that her swelling has improved. She denies palpitations. She has no complaints this morning.   Vitals:   03/16/20 1952 03/16/20 2123 03/17/20 0406 03/17/20 0808  BP: (!) 114/40 (!) 129/58 (!) 126/54 132/61  Pulse: (!) 55  (!) 49 (!) 45  Resp:   17 16  Temp: 98.4 F (36.9 C)  98.3 F (36.8 C) (!) 97.5 F (36.4 C)  TempSrc: Oral  Oral Oral  SpO2: 92%  94% 91%  Weight:   95.9 kg   Height:         Intake/Output Summary (Last 24 hours) at 03/17/2020 0820 Last data filed at 03/17/2020 8768 Gross per 24 hour  Intake 240 ml  Output 5350 ml  Net -5110 ml      PHYSICAL EXAM  General: Elderly female sitting up in bed eating breakfast in no acute distress HEENT:  Normocephalic and atramatic Neck: no obvious JVD Lungs: normal effort of breathing on room air, no wheezing. Heart: irregularly irregular without gallops or murmurs.  Abdomen: nondistended Msk: gait not assessed. Normal strength and tone for age. Extremities: with 1+ bilateral lower extremity edema.   Neuro: Alert and oriented X 3. Psych:  Good affect, responds appropriately   LABS: Basic Metabolic Panel: Recent Labs    03/15/20 2224 03/16/20 0415 03/17/20 0427  NA  --  138 137  K  --  3.8 3.7  CL  --  103 96*  CO2  --  26 31  GLUCOSE  --  112* 119*  BUN  --  12 23  CREATININE  --  0.91 1.05*  CALCIUM  --  9.0 9.2  MG 1.1*  --   --    Liver Function Tests: No results for input(s): AST, ALT, ALKPHOS, BILITOT, PROT, ALBUMIN in the last 72 hours. No results for input(s): LIPASE, AMYLASE in the last 72 hours. CBC: Recent Labs    03/15/20 1859 03/16/20 0415  WBC 8.9 8.6  NEUTROABS 7.2  --   HGB 10.5* 10.0*  HCT 33.3* 31.5*  MCV 93.5 92.6  PLT 198 198   Cardiac Enzymes: No results for input(s):  CKTOTAL, CKMB, CKMBINDEX, TROPONINI in the last 72 hours. BNP: Invalid input(s): POCBNP D-Dimer: No results for input(s): DDIMER in the last 72 hours. Hemoglobin A1C: Recent Labs    03/16/20 0415  HGBA1C 6.1*   Fasting Lipid Panel: No results for input(s): CHOL, HDL, LDLCALC, TRIG, CHOLHDL, LDLDIRECT in the last 72 hours. Thyroid Function Tests: Recent Labs    03/15/20 2224  TSH 1.537   Anemia Panel: No results for input(s): VITAMINB12, FOLATE, FERRITIN, TIBC, IRON, RETICCTPCT in the last 72 hours.  DG Chest 2 View  Result Date: 03/15/2020 CLINICAL DATA:  Short of breath EXAM: CHEST - 2 VIEW COMPARISON:  06/16/2019 FINDINGS: Small bilateral pleural effusions. Mild airspace disease at the bases presumably atelectasis. Normal cardiac size. No pneumothorax. Surgical hardware in the cervical spine IMPRESSION: Small bilateral pleural effusions with bibasilar atelectasis. Electronically Signed   By: Donavan Foil M.D.   On: 03/15/2020 17:49   ECHOCARDIOGRAM COMPLETE  Result Date: 03/16/2020    ECHOCARDIOGRAM REPORT   Patient Name:   Alexa Lowe Date of Exam: 03/16/2020 Medical Rec #:  [id number]       Height:       62.0 in  Accession #:    [id number]      Weight:       230.0 lb Date of Birth:  January 28, 1931       BSA:          2.029 m Patient Age:    86 years        BP:           149/63 mmHg Patient Gender: F               HR:           62 bpm. Exam Location:  [facility] Procedure: 2D Echo, Color Doppler, Cardiac Doppler and Intracardiac            Opacification Agent Indications:     I48.91 Atrial fibrillation  History:         Patient has prior history of Echocardiogram examinations, most                  recent 08/25/2018. COPD; Risk Factors:Hypertension and Diabetes.  Sonographer:     Charmayne Sheer RDCS (AE) Referring Phys:  [id number] Cleaster Corin PATEL Diagnosing Phys: Isaias Cowman MD  Sonographer Comments: Suboptimal apical window and suboptimal subcostal window. Image acquisition challenging due to  patient body habitus, Image acquisition challenging due to mastectomy and Image acquisition challenging due to COPD. IMPRESSIONS  1. Left ventricular ejection fraction, by estimation, is 50 to 55%. The left ventricle has low normal function. The left ventricle has no regional wall motion abnormalities. Left ventricular diastolic parameters were normal.  2. Right ventricular systolic function is normal. The right ventricular size is normal.  3. Right atrial size was moderately dilated.  4. The mitral valve is normal in structure. Mild to moderate mitral valve regurgitation. No evidence of mitral stenosis.  5. Tricuspid valve regurgitation is mild to moderate.  6. The aortic valve is normal in structure. Aortic valve regurgitation is not visualized. No aortic stenosis is present.  7. The inferior vena cava is normal in size with greater than 50% respiratory variability, suggesting right atrial pressure of 3 mmHg. FINDINGS  Left Ventricle: Left ventricular ejection fraction, by estimation, is 50 to 55%. The left ventricle has low normal function. The left ventricle has no regional wall motion abnormalities. Definity contrast agent was given IV to delineate the left ventricular endocardial borders. The left ventricular internal cavity size was normal in size. There is no left ventricular hypertrophy. Left ventricular diastolic parameters were normal. Right Ventricle: The right ventricular size is normal. No increase in right ventricular wall thickness. Right ventricular systolic function is normal. Left Atrium: Left atrial size was normal in size. Right Atrium: Right atrial size was moderately dilated. Pericardium: There is no evidence of pericardial effusion. Mitral Valve: The mitral valve is normal in structure. Mild to moderate mitral valve regurgitation. No evidence of mitral valve stenosis. MV peak gradient, 5.1 mmHg. The mean mitral valve gradient is 2.0 mmHg. Tricuspid Valve: The tricuspid valve is normal in  structure. Tricuspid valve regurgitation is mild to moderate. No evidence of tricuspid stenosis. Aortic Valve: The aortic valve is normal in structure. Aortic valve regurgitation is not visualized. No aortic stenosis is present. Aortic valve mean gradient measures 4.0 mmHg. Aortic valve peak gradient measures 8.8 mmHg. Aortic valve area, by VTI measures 1.46 cm. Pulmonic Valve: The pulmonic valve was normal in structure. Pulmonic valve regurgitation is not visualized. No evidence of pulmonic stenosis. Aorta: The aortic root is normal in size and structure. Venous: The  inferior vena cava is normal in size with greater than 50% respiratory variability, suggesting right atrial pressure of 3 mmHg. IAS/Shunts: No atrial level shunt detected by color flow Doppler.  LEFT VENTRICLE PLAX 2D LVIDd:         5.34 cm  Diastology LVIDs:         3.92 cm  LV e' medial:    8.05 cm/s LV PW:         0.87 cm  LV E/e' medial:  14.5 LV IVS:        0.72 cm  LV e' lateral:   6.74 cm/s LVOT diam:     1.70 cm  LV E/e' lateral: 17.4 LV SV:         49 LV SV Index:   24 LVOT Area:     2.27 cm  LEFT ATRIUM             Index LA diam:        4.20 cm 2.07 cm/m LA Vol (A2C):   34.0 ml 16.76 ml/m LA Vol (A4C):   64.2 ml 31.65 ml/m LA Biplane Vol: 51.9 ml 25.58 ml/m  AORTIC VALVE                   PULMONIC VALVE AV Area (Vmax):    1.34 cm    PV Vmax:       1.31 m/s AV Area (Vmean):   1.39 cm    PV Vmean:      79.600 cm/s AV Area (VTI):     1.46 cm    PV VTI:        0.258 m AV Vmax:           148.00 cm/s PV Peak grad:  6.9 mmHg AV Vmean:          91.300 cm/s PV Mean grad:  3.0 mmHg AV VTI:            0.334 m AV Peak Grad:      8.8 mmHg AV Mean Grad:      4.0 mmHg LVOT Vmax:         87.50 cm/s LVOT Vmean:        56.000 cm/s LVOT VTI:          0.215 m LVOT/AV VTI ratio: 0.64  AORTA Ao Root diam: 2.90 cm MITRAL VALVE MV Area (PHT): 3.42 cm     SHUNTS MV Peak grad:  5.1 mmHg     Systemic VTI:  0.22 m MV Mean grad:  2.0 mmHg     Systemic Diam:  1.70 cm MV Vmax:       1.13 m/s MV Vmean:      58.4 cm/s MV Decel Time: 222 msec MV E velocity: 117.00 cm/s MV A velocity: 57.40 cm/s MV E/A ratio:  2.04 Isaias Cowman MD Electronically signed by Isaias Cowman MD Signature Date/Time: 03/16/2020/1:20:13 PM    Final      Echo normal left ventricular function with LVEF 50 to 55% with no regional wall motion abnormalities, mild to moderate mitral and tricuspid regurgitation  TELEMETRY: atrial fibrillation with controlled ventricular rate  ASSESSMENT AND PLAN:  Principal Problem:   Atrial fibrillation with RVR (HCC) Active Problems:   Hypertension associated with diabetes (HCC)   Diabetes (HCC)   COPD (chronic obstructive pulmonary disease) (HCC)   Hyperlipidemia associated with type 2 diabetes mellitus (HCC)   History of CVA (cerebrovascular accident)   Acute CHF (congestive heart failure) ([place])  PAF (paroxysmal atrial fibrillation) ([place])    1.  Atrial fibrillation with controlled ventricular rate, chads vas score of 8, on metoprolol tartrate for rate control and started Eliquis for stroke prevention.  2D echocardiogram reveals normal left ventricular function with mild to moderate mitral and tricuspid regurgitation. 2.  Acute diastolic congestive heart failure, clinically improved after IV furosemide 3.  History of CVA, status post punctate left parietal lobe infarct 08/2018 4.  History of GI bleed/gastric ulcer 05/19/2018, hemoglobin hematocrit 10 and 31.5, respectively  Recommendations: 1.  Agree with current therapy 2.  Continue Eliquis 5 mg twice daily for stroke prevention, monitoring for GI bleed 3.  Continue diuresis with careful monitoring of renal status, daily weight, and I&O's 4.  Continue metoprolol tartrate 12.5 mg twice daily for rate control 5.  Plan to refer to heart failure clinic as outpatient   Sharolyn Douglas 03/17/2020 8:20 AM

## 2020-03-18 LAB — BASIC METABOLIC PANEL
Anion gap: 12 (ref 5–15)
BUN: 27 mg/dL — ABNORMAL HIGH (ref 8–23)
CO2: 33 mmol/L — ABNORMAL HIGH (ref 22–32)
Calcium: 9.3 mg/dL (ref 8.9–10.3)
Chloride: 94 mmol/L — ABNORMAL LOW (ref 98–111)
Creatinine, Ser: 1.07 mg/dL — ABNORMAL HIGH (ref 0.44–1.00)
GFR, Estimated: 50 mL/min — ABNORMAL LOW (ref >60)
Glucose, Bld: 148 mg/dL — ABNORMAL HIGH (ref 70–99)
Potassium: 3.7 mmol/L (ref 3.5–5.1)
Sodium: 139 mmol/L (ref 135–145)

## 2020-03-18 LAB — GLUCOSE, CAPILLARY
Glucose-Capillary: 148 mg/dL — ABNORMAL HIGH (ref 70–99)
Glucose-Capillary: 111 mg/dL — ABNORMAL HIGH (ref 70–99)
Glucose-Capillary: 373 mg/dL — ABNORMAL HIGH (ref 70–99)
Glucose-Capillary: 173 mg/dL — ABNORMAL HIGH (ref 70–99)

## 2020-03-18 LAB — MAGNESIUM: Magnesium: 1.6 mg/dL — ABNORMAL LOW (ref 1.7–2.4)

## 2020-03-18 MED ORDER — MAGNESIUM SULFATE 2 GM/50ML IV SOLN
2.0000 g | Freq: Once | INTRAVENOUS | Status: AC
  Administered 2020-03-18: 16:00:00 2 g via INTRAVENOUS
  Filled 2020-03-18: qty 50

## 2020-03-18 NOTE — Plan of Care (Signed)
No new changes overnight.  Problem: Education: Goal: Knowledge of General Education information will improve Description: Including pain rating scale, medication(s)/side effects and non-pharmacologic comfort measures Outcome: Progressing   Problem: Health Behavior/Discharge Planning: Goal: Ability to manage health-related needs will improve Outcome: Progressing   Problem: Clinical Measurements: Goal: Ability to maintain clinical measurements within normal limits will improve Outcome: Progressing Goal: Will remain free from infection Outcome: Progressing Goal: Diagnostic test results will improve Outcome: Progressing Goal: Respiratory complications will improve Outcome: Progressing Goal: Cardiovascular complication will be avoided Outcome: Progressing

## 2020-03-18 NOTE — Progress Notes (Signed)
PROGRESS NOTE    Alexa Lowe  HEN:[id number] DOB: September 19, 1930 DOA: 03/15/2020 PCP: Dion Body, MD    Brief Narrative:  Alexa Lowe a 86 y.o.femalewith medical history significant forCOPD, T2DM, HTN, HLD, history of CVA, essential tremor, restless leg syndrome who presents to the ED for evaluation of shortness of breath and new onset atrial fibrillation.  12/9-pt feels a little better, cardiology was consulted 12/10-bradycardic on monitor. Lying in bed not sure if gets dizzy. Did not receive beta blk this am.  12/11-sleepy this am. No complaints of sob, dizziness, or cp.   Consultants:   cardiology  Procedures:   Antimicrobials:       Subjective: No sob, dizziness, cp  Objective: Vitals:   03/17/20 2033 03/18/20 0442 03/18/20 0828 03/18/20 1207  BP: (!) 109/47 116/79 (!) 154/59 (!) 127/56  Pulse: (!) 55 (!) 50 (!) 48 (!) 52  Resp: 18 14 16 16   Temp: 98.2 F (36.8 C) (!) 97.5 F (36.4 C) 98.4 F (36.9 C) 98.4 F (36.9 C)  TempSrc: Oral Oral  Oral  SpO2: 92% 90% 93% 97%  Weight:  95.5 kg    Height:        Intake/Output Summary (Last 24 hours) at 03/18/2020 1457 Last data filed at 03/18/2020 1023 Gross per 24 hour  Intake 440 ml  Output 1300 ml  Net -860 ml   Filed Weights   03/16/20 1456 03/17/20 0406 03/18/20 0442  Weight: 98.9 kg 95.9 kg 95.5 kg    Examination:  General exam: Appears calm and comfortable  Respiratory system: Clear to auscultation. Respiratory effort normal. Cardiovascular system: S1 & S2 heard, RRR. No JVD, murmurs, rubs, gallops or clicks.  Gastrointestinal system: Abdomen is nondistended, soft and nontender.Normal bowel sounds heard. Central nervous system: Alert and oriented. No focal neurological deficits. Extremities: edema b/l Psychiatry: Mood & affect appropriate.     Data Reviewed: I have personally reviewed following labs and imaging studies  CBC: Recent Labs  Lab 03/15/20 1859  03/16/20 0415  WBC 8.9 8.6  NEUTROABS 7.2  --   HGB 10.5* 10.0*  HCT 33.3* 31.5*  MCV 93.5 92.6  PLT 198 361   Basic Metabolic Panel: Recent Labs  Lab 03/15/20 1859 03/15/20 2224 03/16/20 0415 03/17/20 0427 03/17/20 1359 03/18/20 0457  NA 145  --  138 137  --  139  K 3.5  --  3.8 3.7  --  3.7  CL 110  --  103 96*  --  94*  CO2 24  --  26 31  --  33*  GLUCOSE 121*  --  112* 119*  --  148*  BUN 11  --  12 23  --  27*  CREATININE 0.89  --  0.91 1.05* 1.28* 1.07*  CALCIUM 8.8*  --  9.0 9.2  --  9.3  MG  --  1.1*  --   --   --  1.6*   GFR: Estimated Creatinine Clearance: 39.9 mL/min (A) (by C-G formula based on SCr of 1.07 mg/dL (H)). Liver Function Tests: No results for input(s): AST, ALT, ALKPHOS, BILITOT, PROT, ALBUMIN in the last 168 hours. No results for input(s): LIPASE, AMYLASE in the last 168 hours. No results for input(s): AMMONIA in the last 168 hours. Coagulation Profile: No results for input(s): INR, PROTIME in the last 168 hours. Cardiac Enzymes: No results for input(s): CKTOTAL, CKMB, CKMBINDEX, TROPONINI in the last 168 hours. BNP (last 3 results) No results for input(s): PROBNP in the  last 8760 hours. HbA1C: Recent Labs    03/16/20 0415  HGBA1C 6.1*   CBG: Recent Labs  Lab 03/17/20 1202 03/17/20 1635 03/17/20 2053 03/18/20 0828 03/18/20 1206  GLUCAP 119* 131* 198* 148* 111*   Lipid Profile: No results for input(s): CHOL, HDL, LDLCALC, TRIG, CHOLHDL, LDLDIRECT in the last 72 hours. Thyroid Function Tests: Recent Labs    03/15/20 2224  TSH 1.537   Anemia Panel: No results for input(s): VITAMINB12, FOLATE, FERRITIN, TIBC, IRON, RETICCTPCT in the last 72 hours. Sepsis Labs: No results for input(s): PROCALCITON, LATICACIDVEN in the last 168 hours.  Recent Results (from the past 240 hour(s))  Resp Panel by RT-PCR (Flu A&B, Covid) Nasopharyngeal Swab     Status: None   Collection Time: 03/15/20  6:59 PM   Specimen: Nasopharyngeal Swab;  Nasopharyngeal(NP) swabs in vial transport medium  Result Value Ref Range Status   SARS Coronavirus 2 by RT PCR NEGATIVE NEGATIVE Final    Comment: (NOTE) SARS-CoV-2 target nucleic acids are NOT DETECTED.  The SARS-CoV-2 RNA is generally detectable in upper respiratory specimens during the acute phase of infection. The lowest concentration of SARS-CoV-2 viral copies this assay can detect is 138 copies/mL. A negative result does not preclude SARS-Cov-2 infection and should not be used as the sole basis for treatment or other patient management decisions. A negative result may occur with  improper specimen collection/handling, submission of specimen other than nasopharyngeal swab, presence of viral mutation(s) within the areas targeted by this assay, and inadequate number of viral copies(<138 copies/mL). A negative result must be combined with clinical observations, patient history, and epidemiological information. The expected result is Negative.  Fact Sheet for Patients:  EntrepreneurPulse.com.au  Fact Sheet for Healthcare Providers:  IncredibleEmployment.be  This test is no t yet approved or cleared by the [place] FDA and  has been authorized for detection and/or diagnosis of SARS-CoV-2 by FDA under an Emergency Use Authorization (EUA). This EUA will remain  in effect (meaning this test can be used) for the duration of the COVID-19 declaration under Section 564(b)(1) of the Act, 21 U.S.C.section 360bbb-3(b)(1), unless the authorization is terminated  or revoked sooner.       Influenza A by PCR NEGATIVE NEGATIVE Final   Influenza B by PCR NEGATIVE NEGATIVE Final    Comment: (NOTE) The Xpert Xpress SARS-CoV-2/FLU/RSV plus assay is intended as an aid in the diagnosis of influenza from Nasopharyngeal swab specimens and should not be used as a sole basis for treatment. Nasal washings and aspirates are unacceptable for Xpert Xpress  SARS-CoV-2/FLU/RSV testing.  Fact Sheet for Patients: EntrepreneurPulse.com.au  Fact Sheet for Healthcare Providers: IncredibleEmployment.be  This test is not yet approved or cleared by the [place] FDA and has been authorized for detection and/or diagnosis of SARS-CoV-2 by FDA under an Emergency Use Authorization (EUA). This EUA will remain in effect (meaning this test can be used) for the duration of the COVID-19 declaration under Section 564(b)(1) of the Act, 21 U.S.C. section 360bbb-3(b)(1), unless the authorization is terminated or revoked.  Performed at [facility], [address] [place], [place] [address]          Radiology Studies: No results found.      Scheduled Meds: . apixaban  2.5 mg Oral BID  . atorvastatin  40 mg Oral QHS  . citalopram  20 mg Oral Daily  . gabapentin  300 mg Oral TID  . insulin aspart  0-9 Units Subcutaneous TID WC  . losartan  50 mg Oral Daily  . metoprolol succinate  12.5 mg Oral Daily  . pantoprazole  40 mg Oral Daily  . potassium chloride  20 mEq Oral Daily  . primidone  25 mg Oral BID  . rOPINIRole  0.25 mg Oral QHS   Continuous Infusions: . magnesium sulfate bolus IVPB      Assessment & Plan:   Principal Problem:   Atrial fibrillation with RVR (HCC) Active Problems:   Hypertension associated with diabetes ([place])   Diabetes (HCC)   COPD (chronic obstructive pulmonary disease) (HCC)   Hyperlipidemia associated with type 2 diabetes mellitus (HCC)   History of CVA (cerebrovascular accident)   Acute CHF (congestive heart failure) (HCC)   PAF (paroxysmal atrial fibrillation) ([place])   [place] Lowe a 86 y.o.femalewith medical history significant forCOPD, T2DM, HTN, HLD, history of CVA, essential tremor, restless leg syndrome whois admitted with new onset atrial fibrillation.  Atrial fibrillation/flutter with RVR: -New onset rate has improved  Chads vas score  of 8  12/9-bradycardic at rest, but beta blk held. Looks like received 3 doses so far. 12/1- HR better, no profound brady. Continue Toprol-XL 12.5 daily  Daughter concern about patient being on Eliquis due to concerns of fall. Had previous history of stroke. Daughter wants her to be on a lower dose Eliquis which was decreased by cardiology. PT consulted as patient's daughter would like to see if she can go to assisted living or [facility] EF 50-55% F/u with cards as outpt    Acute diastolic HF: -Euvolemic on exam  Lasix was discontinued  O2 sats above 90% on room air  Continue I's and O's, daily weight     History of CVA: Punctate left parietal lobe infarct May 2020. Per prior neurology note, concern for embolic etiology but echocardiogram was without abnormality at that time. On lower dose of Eliquis Aspirin held due to Eliquis initiation Continue statins   Hypomagnesemia- Mag was supplemented yesterday, today 1.6 We will give IV 2 g x 1 today Monitor level    Type 2 diabetes: -hold metformin Continue RISS   Hypertension: At times elevated Continue ARB and beta blk Continue monitor   Hyperlipidemia: Continue atorvastatin.  COPD: Appears to be stable without acute exacerbation . Continue albuterol as needed.  Essential tremor/restless leg syndrome: Continue primidone and ropinirole.  Depression/anxiety: Continue Celexa.   DVT prophylaxis: Eliquis Code Status: DNR Family Communication: Tried to call daughter but could not get through her phone  Status is: Inpatient  Remains inpatient appropriate because:Unsafe d/c plan   Dispo: The patient is from: Home              Anticipated d/c is to: TBD, PT ordered              Anticipated d/c date is: 1 day              Patient currently is medically stable to d/c.unsafe discharge. PT needs to evaluate.             LOS: 2 days   Time spent: 35 minutes with more than 50% on [place], MD [facility] Pager [phone]  If 7PM-7AM, please contact night-coverage 03/18/2020, 2:57 PM

## 2020-03-18 NOTE — NC FL2 (Signed)
[place] LEVEL OF CARE SCREENING TOOL     IDENTIFICATION  Patient Name: Alexa Lowe Birthdate: [birth year] Sex: female Admission Date (Current Location): 03/15/2020  [place] and [place] Number:  Engineering geologist and Address:  [facility], [address] [place], [place] [address]      Provider Number:    Attending Physician Name and Address:  Nolberto Hanlon, MD  Relative Name and Phone Number:  Atha Starks (Daughter)   [phone]    Current Level of Care: Hospital Recommended Level of Care: [place] Prior Approval Number:    Date Approved/Denied:   PASRR Number: [id number]  Discharge Plan: SNF    Current Diagnoses: Patient Active Problem List   Diagnosis Date Noted  . PAF (paroxysmal atrial fibrillation) ([place]) 03/16/2020  . Atrial fibrillation with RVR ([place]) 03/15/2020  . Hyperlipidemia associated with type 2 diabetes mellitus ([place]) 03/15/2020  . History of CVA (cerebrovascular accident) 03/15/2020  . Acute CHF (congestive heart failure) ([place]) 03/15/2020  . Chronic gastric ulcer without hemorrhage and without perforation   . Encephalopathy 08/25/2018  . TIA (transient ischemic attack) 08/24/2018  . Symptomatic anemia 05/19/2018  . Hypertension associated with diabetes ([place]) 05/19/2018  . Diabetes ([place]) 05/19/2018  . COPD (chronic obstructive pulmonary disease) ([place]) 05/19/2018    Orientation RESPIRATION BLADDER Height & Weight     Self,Time,Place    Incontinent Weight: 210 lb 9.6 oz (95.5 kg) Height:  5\' 2"  (157.5 cm)  BEHAVIORAL SYMPTOMS/MOOD NEUROLOGICAL BOWEL NUTRITION STATUS      Incontinent    AMBULATORY STATUS COMMUNICATION OF NEEDS Skin   Extensive Assist Verbally                         Personal Care Assistance Level of Assistance  Bathing,Feeding,Dressing,Total care Bathing Assistance: Limited assistance Feeding assistance: Limited assistance Dressing Assistance: Maximum  assistance Total Care Assistance: Maximum assistance   Functional Limitations Info             SPECIAL CARE FACTORS FREQUENCY  PT (By licensed PT),OT (By licensed OT)     PT Frequency: 5 x weekly OT Frequency: 5 x weekly            Contractures      Additional Factors Info  Code Status,Allergies Code Status Info: Full Allergies Info: Morphine And Related  Ace Inhibitors  Doxycycline  Erythromycin Ethylsuccinate  Codeine           Current Medications (03/18/2020):  This is the current hospital active medication list Current Facility-Administered Medications  Medication Dose Route Frequency Provider Last Rate Last Admin  . acetaminophen (TYLENOL) tablet 650 mg  650 mg Oral Q4H PRN Lenore Cordia, MD   650 mg at 03/18/20 1602  . albuterol (VENTOLIN HFA) 108 (90 Base) MCG/ACT inhaler 1-2 puff  1-2 puff Inhalation Q4H PRN Zada Finders R, MD      . apixaban Arne Cleveland) tablet 2.5 mg  2.5 mg Oral BID Clabe Seal, PA-C   2.5 mg at 03/18/20 0845  . atorvastatin (LIPITOR) tablet 40 mg  40 mg Oral QHS Lenore Cordia, MD   40 mg at 03/17/20 2138  . citalopram (CELEXA) tablet 20 mg  20 mg Oral Daily Lenore Cordia, MD   20 mg at 03/18/20 0846  . gabapentin (NEURONTIN) capsule 300 mg  300 mg Oral TID Lenore Cordia, MD   300 mg at 03/18/20 1603  . insulin aspart (novoLOG) injection 0-9  Units  0-9 Units Subcutaneous TID WC Lenore Cordia, MD   1 Units at 03/18/20 0844  . losartan (COZAAR) tablet 50 mg  50 mg Oral Daily Lenore Cordia, MD   50 mg at 03/18/20 0846  . magnesium sulfate IVPB 2 g 50 mL  2 g Intravenous Once Nolberto Hanlon, MD 50 mL/hr at 03/18/20 1607 2 g at 03/18/20 1607  . melatonin tablet 5 mg  5 mg Oral QHS PRN Lang Snow, NP   5 mg at 03/16/20 0224  . metoprolol succinate (TOPROL-XL) 24 hr tablet 12.5 mg  12.5 mg Oral Daily Nolberto Hanlon, MD      . ondansetron (ZOFRAN) injection 4 mg  4 mg Intravenous Q6H PRN Zada Finders R, MD      . pantoprazole  (PROTONIX) EC tablet 40 mg  40 mg Oral Daily Lenore Cordia, MD   40 mg at 03/18/20 0846  . potassium chloride (KLOR-CON) packet 20 mEq  20 mEq Oral Daily Lenore Cordia, MD   20 mEq at 03/18/20 0844  . primidone (MYSOLINE) tablet 25 mg  25 mg Oral BID Lenore Cordia, MD   25 mg at 03/18/20 0845  . rOPINIRole (REQUIP) tablet 0.25 mg  0.25 mg Oral QHS Zada Finders R, MD   0.25 mg at 03/17/20 2138     Discharge Medications: Please see discharge summary for a list of discharge medications.  Relevant Imaging Results:  Relevant Lab Results:   Additional Information SS # [id number]  Meriel Flavors, LCSW

## 2020-03-18 NOTE — Progress Notes (Signed)
[facility] Cardiology  SUBJECTIVE: Patient laying in bed, reports feeling somewhat better   Vitals:   03/17/20 2033 03/18/20 0442 03/18/20 0828 03/18/20 1207  BP: (!) 109/47 116/79 (!) 154/59 (!) 127/56  Pulse: (!) 55 (!) 50 (!) 48 (!) 52  Resp: 18 14 16 16   Temp: 98.2 F (36.8 C) (!) 97.5 F (36.4 C) 98.4 F (36.9 C) 98.4 F (36.9 C)  TempSrc: Oral Oral  Oral  SpO2: 92% 90% 93% 97%  Weight:  95.5 kg    Height:         Intake/Output Summary (Last 24 hours) at 03/18/2020 1546 Last data filed at 03/18/2020 1023 Gross per 24 hour  Intake 440 ml  Output 1300 ml  Net -860 ml      PHYSICAL EXAM  General: Well developed, well nourished, in no acute distress HEENT:  Normocephalic and atramatic Neck:  No JVD.  Lungs: Clear bilaterally to auscultation and percussion. Heart: HRRR . Normal S1 and S2 without gallops or murmurs.  Abdomen: Bowel sounds are positive, abdomen soft and non-tender  Msk:  Back normal, normal gait. Normal strength and tone for age. Extremities: No clubbing, cyanosis or edema.   Neuro: Alert and oriented X 3. Psych:  Good affect, responds appropriately   LABS: Basic Metabolic Panel: Recent Labs    03/15/20 2224 03/16/20 0415 03/17/20 0427 03/17/20 1359 03/18/20 0457  NA  --    < > 137  --  139  K  --    < > 3.7  --  3.7  CL  --    < > 96*  --  94*  CO2  --    < > 31  --  33*  GLUCOSE  --    < > 119*  --  148*  BUN  --    < > 23  --  27*  CREATININE  --    < > 1.05* 1.28* 1.07*  CALCIUM  --    < > 9.2  --  9.3  MG 1.1*  --   --   --  1.6*   < > = values in this interval not displayed.   Liver Function Tests: No results for input(s): AST, ALT, ALKPHOS, BILITOT, PROT, ALBUMIN in the last 72 hours. No results for input(s): LIPASE, AMYLASE in the last 72 hours. CBC: Recent Labs    03/15/20 1859 03/16/20 0415  WBC 8.9 8.6  NEUTROABS 7.2  --   HGB 10.5* 10.0*  HCT 33.3* 31.5*  MCV 93.5 92.6  PLT 198 198   Cardiac Enzymes: No results for  input(s): CKTOTAL, CKMB, CKMBINDEX, TROPONINI in the last 72 hours. BNP: Invalid input(s): POCBNP D-Dimer: No results for input(s): DDIMER in the last 72 hours. Hemoglobin A1C: Recent Labs    03/16/20 0415  HGBA1C 6.1*   Fasting Lipid Panel: No results for input(s): CHOL, HDL, LDLCALC, TRIG, CHOLHDL, LDLDIRECT in the last 72 hours. Thyroid Function Tests: Recent Labs    03/15/20 2224  TSH 1.537   Anemia Panel: No results for input(s): VITAMINB12, FOLATE, FERRITIN, TIBC, IRON, RETICCTPCT in the last 72 hours.  No results found.   Echo LVEF 50 to 55%  TELEMETRY: Sinus rhythm at 61 bpm:  ASSESSMENT AND PLAN:  Principal Problem:   Atrial fibrillation with RVR (HCC) Active Problems:   Hypertension associated with diabetes ([place])   Diabetes (HCC)   COPD (chronic obstructive pulmonary disease) (HCC)   Hyperlipidemia associated with type 2 diabetes mellitus ([place])  History of CVA (cerebrovascular accident)   Acute CHF (congestive heart failure) (HCC)   PAF (paroxysmal atrial fibrillation) ([place])    1.  Paroxysmal atrial fibrillation, in sinus rhythm at 62 bpm, chads vas score of 8, on Eliquis for stroke prevention and metoprolol to tartrate for rate and rhythm control 2.  Acute diastolic congestive heart failure, improved after diuresis with IV furosemide 3.  History of CVA, status post punctate left parietal lobe infarct 08/2018 4.  History of GI bleeding gastric ulcer 05/19/2018, hemodynamically stable, on reduced dose of Eliquis  Recommendations  1.  Agree with current therapy 2.  Continue diuresis 3.  Carefully monitor renal status 4.  Continue low-dose Eliquis for stroke prevention 5.  Continue metoprolol tartrate 12.5 mg twice daily for rate and rhythm control 6.  Follow-up with me 1 to 2 weeks following discharge   Isaias Cowman, MD, PhD, [facility] 03/18/2020 3:46 PM

## 2020-03-19 LAB — GLUCOSE, CAPILLARY
Glucose-Capillary: 144 mg/dL — ABNORMAL HIGH (ref 70–99)
Glucose-Capillary: 154 mg/dL — ABNORMAL HIGH (ref 70–99)
Glucose-Capillary: 169 mg/dL — ABNORMAL HIGH (ref 70–99)
Glucose-Capillary: 210 mg/dL — ABNORMAL HIGH (ref 70–99)

## 2020-03-19 LAB — CBC
HCT: 34.8 % — ABNORMAL LOW (ref 36.0–46.0)
Hemoglobin: 11.2 g/dL — ABNORMAL LOW (ref 12.0–15.0)
MCH: 29.5 pg (ref 26.0–34.0)
MCHC: 32.2 g/dL (ref 30.0–36.0)
MCV: 91.6 fL (ref 80.0–100.0)
Platelets: 197 10*3/uL (ref 150–400)
RBC: 3.8 MIL/uL — ABNORMAL LOW (ref 3.87–5.11)
RDW: 16.3 % — ABNORMAL HIGH (ref 11.5–15.5)
WBC: 7.8 10*3/uL (ref 4.0–10.5)
nRBC: 0 % (ref 0.0–0.2)

## 2020-03-19 MED ORDER — INSULIN ASPART 100 UNIT/ML ~~LOC~~ SOLN
0.0000 [IU] | Freq: Every day | SUBCUTANEOUS | Status: DC
  Administered 2020-03-19: 21:00:00 2 [IU] via SUBCUTANEOUS
  Administered 2020-03-21: 22:00:00 3 [IU] via SUBCUTANEOUS
  Filled 2020-03-19 (×2): qty 1

## 2020-03-19 MED ORDER — NYSTATIN 100000 UNIT/GM EX CREA
TOPICAL_CREAM | Freq: Two times a day (BID) | CUTANEOUS | Status: DC
  Filled 2020-03-19: qty 15

## 2020-03-19 MED ORDER — INSULIN ASPART 100 UNIT/ML ~~LOC~~ SOLN
0.0000 [IU] | Freq: Three times a day (TID) | SUBCUTANEOUS | Status: DC
  Administered 2020-03-20: 09:00:00 3 [IU] via SUBCUTANEOUS
  Administered 2020-03-20: 17:00:00 2 [IU] via SUBCUTANEOUS
  Administered 2020-03-20 – 2020-03-21 (×2): 3 [IU] via SUBCUTANEOUS
  Administered 2020-03-21: 10:00:00 5 [IU] via SUBCUTANEOUS
  Administered 2020-03-21: 13:00:00 2 [IU] via SUBCUTANEOUS
  Administered 2020-03-22: 10:00:00 3 [IU] via SUBCUTANEOUS
  Filled 2020-03-19 (×7): qty 1

## 2020-03-19 NOTE — Progress Notes (Signed)
PROGRESS NOTE    Alexa Lowe  BLT:[id number] DOB: [birth year] DOA: 03/15/2020 PCP: Dion Body, MD    Brief Narrative:  Alexa Lowe a 86 y.o.femalewith medical history significant forCOPD, T2DM, HTN, HLD, history of CVA, essential tremor, restless leg syndrome who presents to the ED for evaluation of shortness of breath and new onset atrial fibrillation.  12/9-pt feels a little better, cardiology was consulted 12/10-bradycardic on monitor. Lying in bed not sure if gets dizzy. Did not receive beta blk this am.  12/12-patient decided to go to SNF.  Sinus bradycardia however asymptomatic  Consultants:   cardiology  Procedures:   Antimicrobials:       Subjective: Got up to go to the bathroom, denies any dizziness or lightheadedness with ambulation.  No shortness of breath or chest pain  Objective: Vitals:   03/18/20 1955 03/19/20 0346 03/19/20 0752 03/19/20 1124  BP: 117/64 (!) 134/55 (!) 134/59 (!) 142/71  Pulse: 63 (!) 51 (!) 51 (!) 55  Resp: 17 15 16 16   Temp: 98.5 F (36.9 C) 98.7 F (37.1 C) 98.2 F (36.8 C) 98.2 F (36.8 C)  TempSrc: Oral Oral Oral Oral  SpO2: 95% 93% 94% 96%  Weight:  96.6 kg    Height:        Intake/Output Summary (Last 24 hours) at 03/19/2020 1420 Last data filed at 03/18/2020 2214 Gross per 24 hour  Intake 50 ml  Output 1700 ml  Net -1650 ml   Filed Weights   03/17/20 0406 03/18/20 0442 03/19/20 0346  Weight: 95.9 kg 95.5 kg 96.6 kg    Examination: Calm, laying flat in bed, NAD CTA, no wheeze rales rhonchi's Regular S1-S2 no murmurs Soft abdomen benign positive bowel sounds No edema Alert oriented x3, grossly intact Mood and affect appropriate in current setting   Data Reviewed: I have personally reviewed following labs and imaging studies  CBC: Recent Labs  Lab 03/15/20 1859 03/16/20 0415 03/19/20 0343  WBC 8.9 8.6 7.8  NEUTROABS 7.2  --   --   HGB 10.5* 10.0* 11.2*  HCT 33.3* 31.5* 34.8*   MCV 93.5 92.6 91.6  PLT 198 198 007   Basic Metabolic Panel: Recent Labs  Lab 03/15/20 1859 03/15/20 2224 03/16/20 0415 03/17/20 0427 03/17/20 1359 03/18/20 0457  NA 145  --  138 137  --  139  K 3.5  --  3.8 3.7  --  3.7  CL 110  --  103 96*  --  94*  CO2 24  --  26 31  --  33*  GLUCOSE 121*  --  112* 119*  --  148*  BUN 11  --  12 23  --  27*  CREATININE 0.89  --  0.91 1.05* 1.28* 1.07*  CALCIUM 8.8*  --  9.0 9.2  --  9.3  MG  --  1.1*  --   --   --  1.6*   GFR: Estimated Creatinine Clearance: 40.2 mL/min (A) (by C-G formula based on SCr of 1.07 mg/dL (H)). Liver Function Tests: No results for input(s): AST, ALT, ALKPHOS, BILITOT, PROT, ALBUMIN in the last 168 hours. No results for input(s): LIPASE, AMYLASE in the last 168 hours. No results for input(s): AMMONIA in the last 168 hours. Coagulation Profile: No results for input(s): INR, PROTIME in the last 168 hours. Cardiac Enzymes: No results for input(s): CKTOTAL, CKMB, CKMBINDEX, TROPONINI in the last 168 hours. BNP (last 3 results) No results for input(s): PROBNP in the last  8760 hours. HbA1C: No results for input(s): HGBA1C in the last 72 hours. CBG: Recent Labs  Lab 03/18/20 1206 03/18/20 1734 03/18/20 2158 03/19/20 0754 03/19/20 1126  GLUCAP 111* 373* 173* 144* 154*   Lipid Profile: No results for input(s): CHOL, HDL, LDLCALC, TRIG, CHOLHDL, LDLDIRECT in the last 72 hours. Thyroid Function Tests: No results for input(s): TSH, T4TOTAL, FREET4, T3FREE, THYROIDAB in the last 72 hours. Anemia Panel: No results for input(s): VITAMINB12, FOLATE, FERRITIN, TIBC, IRON, RETICCTPCT in the last 72 hours. Sepsis Labs: No results for input(s): PROCALCITON, LATICACIDVEN in the last 168 hours.  Recent Results (from the past 240 hour(s))  Resp Panel by RT-PCR (Flu A&B, Covid) Nasopharyngeal Swab     Status: None   Collection Time: 03/15/20  6:59 PM   Specimen: Nasopharyngeal Swab; Nasopharyngeal(NP) swabs in vial  transport medium  Result Value Ref Range Status   SARS Coronavirus 2 by RT PCR NEGATIVE NEGATIVE Final    Comment: (NOTE) SARS-CoV-2 target nucleic acids are NOT DETECTED.  The SARS-CoV-2 RNA is generally detectable in upper respiratory specimens during the acute phase of infection. The lowest concentration of SARS-CoV-2 viral copies this assay can detect is 138 copies/mL. A negative result does not preclude SARS-Cov-2 infection and should not be used as the sole basis for treatment or other patient management decisions. A negative result may occur with  improper specimen collection/handling, submission of specimen other than nasopharyngeal swab, presence of viral mutation(s) within the areas targeted by this assay, and inadequate number of viral copies(<138 copies/mL). A negative result must be combined with clinical observations, patient history, and epidemiological information. The expected result is Negative.  Fact Sheet for Patients:  EntrepreneurPulse.com.au  Fact Sheet for Healthcare Providers:  IncredibleEmployment.be  This test is no t yet approved or cleared by the [place] FDA and  has been authorized for detection and/or diagnosis of SARS-CoV-2 by FDA under an Emergency Use Authorization (EUA). This EUA will remain  in effect (meaning this test can be used) for the duration of the COVID-19 declaration under Section 564(b)(1) of the Act, 21 U.S.C.section 360bbb-3(b)(1), unless the authorization is terminated  or revoked sooner.       Influenza A by PCR NEGATIVE NEGATIVE Final   Influenza B by PCR NEGATIVE NEGATIVE Final    Comment: (NOTE) The Xpert Xpress SARS-CoV-2/FLU/RSV plus assay is intended as an aid in the diagnosis of influenza from Nasopharyngeal swab specimens and should not be used as a sole basis for treatment. Nasal washings and aspirates are unacceptable for Xpert Xpress SARS-CoV-2/FLU/RSV testing.  Fact  Sheet for Patients: EntrepreneurPulse.com.au  Fact Sheet for Healthcare Providers: IncredibleEmployment.be  This test is not yet approved or cleared by the [place] FDA and has been authorized for detection and/or diagnosis of SARS-CoV-2 by FDA under an Emergency Use Authorization (EUA). This EUA will remain in effect (meaning this test can be used) for the duration of the COVID-19 declaration under Section 564(b)(1) of the Act, 21 U.S.C. section 360bbb-3(b)(1), unless the authorization is terminated or revoked.  Performed at [facility], [address], [place], [place] [address]          Radiology Studies: No results found.      Scheduled Meds: . apixaban  2.5 mg Oral BID  . atorvastatin  40 mg Oral QHS  . citalopram  20 mg Oral Daily  . gabapentin  300 mg Oral TID  . insulin aspart  0-9 Units Subcutaneous TID WC  . losartan  50 mg Oral Daily  . metoprolol succinate  12.5 mg Oral Daily  . nystatin cream   Topical BID  . pantoprazole  40 mg Oral Daily  . potassium chloride  20 mEq Oral Daily  . primidone  25 mg Oral BID  . rOPINIRole  0.25 mg Oral QHS   Continuous Infusions:   Assessment & Plan:   Principal Problem:   Atrial fibrillation with RVR (HCC) Active Problems:   Hypertension associated with diabetes ([place])   Diabetes (HCC)   COPD (chronic obstructive pulmonary disease) (HCC)   Hyperlipidemia associated with type 2 diabetes mellitus (HCC)   History of CVA (cerebrovascular accident)   Acute CHF (congestive heart failure) (HCC)   PAF (paroxysmal atrial fibrillation) ([place])   [place] S Lowe a 86 y.o.femalewith medical history significant forCOPD, T2DM, HTN, HLD, history of CVA, essential tremor, restless leg syndrome whois admitted with new onset atrial fibrillation.  Atrial fibrillation/flutter with RVR: -New onset rate has improved  Chads vas score of 8  12/9-bradycardic at rest, but  beta blk held. Looks like received 3 doses so far. 12/11- HR better, no profound brady. 12/12-bradycardic at rest, asymptomatic.  Heart rate better than yesterday. Continue Toprol-XL 12.5 daily Daughter concern about patient being on Eliquis due to concerns of fall. Had previous history of stroke. Daughter wants her to be on a lower dose Eliquis which was decreased by cardiology. EF 50 to 55% Cardiology as outpatient PT recommended SNF -patient is agreeable     Acute diastolic HF: -Resolved.  Euvolemic on exam  Lasix was discontinued  Continue monitoring I's and O's and daily weight     History of CVA: Punctate left parietal lobe infarct May 2020. Per prior neurology note, concern for embolic etiology but echocardiogram was without abnormality at that time. -As mentioned above on lower dose of Eliquis  Aspirin held due to Eliquis initiation  Continue statins     Hypomagnesemia- Was supplemented Will check level in a.m.    Type 2 diabetes: -hold metformin Continue RISS   Hypertension: At times elevated Continue ARB and beta blk Continue monitor   Hyperlipidemia: Continue atorvastatin.  COPD: Appears to be stable without acute exacerbation . Continue albuterol as needed.  Essential tremor/restless leg syndrome: Continue primidone and ropinirole.  Depression/anxiety: Continue Celexa.   DVT prophylaxis: Eliquis Code Status: DNR Family Communication: called daughter but went to VM.  Status is: Inpatient  Remains inpatient appropriate because:Unsafe d/c plan   Dispo: The patient is from: Home              Anticipated d/c is to: SNF              Anticipated d/c date is: 1 day              Patient currently is medically stable to d/c.unsafe discharge.           LOS: 3 days   Time spent: 35 minutes with more than 50% on [place], MD [facility] Pager [phone]  If 7PM-7AM, please contact  night-coverage 03/19/2020, 2:20 PM

## 2020-03-19 NOTE — Plan of Care (Signed)

## 2020-03-19 NOTE — TOC Progression Note (Addendum)
Transition of Care [facility]) - Progression Note    Patient Details  Name: Alexa Lowe MRN: [id number] Date of Birth: Mar 08, 1931  [facility]) CM/SW Contact  Meriel Flavors, LCSW Phone Number: 03/19/2020, 11:53 AM  Clinical Narrative:    Patient recommended for SNF placement, CSW spoke with patient's daughter, [name] who agrees with SNF because Pt is not safe to go home alone at this point. CSW completed workup and sent out for offers  **1:12pm  Patient has a Geographical information systems officer that works at  [employer] in [place] and patient is requesting to go there  Office staff not available on Sunday's Monday ASAP please call Estill Bamberg) in admissions at [phone] Her Cell # [phone]  Expected Discharge Plan and Services                                                 Social Determinants of Health (SDOH) Interventions    Readmission Risk Interventions No flowsheet data found.

## 2020-03-19 NOTE — Progress Notes (Signed)
[facility] Cardiology  SUBJECTIVE: Patient laying in bed, reports feeling better, denies chest pain or shortness of breath   Vitals:   03/18/20 1955 03/19/20 0346 03/19/20 0752 03/19/20 1124  BP: 117/64 (!) 134/55 (!) 134/59 (!) 142/71  Pulse: 63 (!) 51 (!) 51 (!) 55  Resp: 17 15 16 16   Temp: 98.5 F (36.9 C) 98.7 F (37.1 C) 98.2 F (36.8 C) 98.2 F (36.8 C)  TempSrc: Oral Oral Oral Oral  SpO2: 95% 93% 94% 96%  Weight:  96.6 kg    Height:         Intake/Output Summary (Last 24 hours) at 03/19/2020 1434 Last data filed at 03/18/2020 2214 Gross per 24 hour  Intake 50 ml  Output 1700 ml  Net -1650 ml      PHYSICAL EXAM  General: Well developed, well nourished, in no acute distress HEENT:  Normocephalic and atramatic Neck:  No JVD.  Lungs: Clear bilaterally to auscultation and percussion. Heart: HRRR . Normal S1 and S2 without gallops or murmurs.  Abdomen: Bowel sounds are positive, abdomen soft and non-tender  Msk:  Back normal, normal gait. Normal strength and tone for age. Extremities: No clubbing, cyanosis or edema.   Neuro: Alert and oriented X 3. Psych:  Good affect, responds appropriately   LABS: Basic Metabolic Panel: Recent Labs    03/17/20 0427 03/17/20 1359 03/18/20 0457  NA 137  --  139  K 3.7  --  3.7  CL 96*  --  94*  CO2 31  --  33*  GLUCOSE 119*  --  148*  BUN 23  --  27*  CREATININE 1.05* 1.28* 1.07*  CALCIUM 9.2  --  9.3  MG  --   --  1.6*   Liver Function Tests: No results for input(s): AST, ALT, ALKPHOS, BILITOT, PROT, ALBUMIN in the last 72 hours. No results for input(s): LIPASE, AMYLASE in the last 72 hours. CBC: Recent Labs    03/19/20 0343  WBC 7.8  HGB 11.2*  HCT 34.8*  MCV 91.6  PLT 197   Cardiac Enzymes: No results for input(s): CKTOTAL, CKMB, CKMBINDEX, TROPONINI in the last 72 hours. BNP: Invalid input(s): POCBNP D-Dimer: No results for input(s): DDIMER in the last 72 hours. Hemoglobin A1C: No results for input(s):  HGBA1C in the last 72 hours. Fasting Lipid Panel: No results for input(s): CHOL, HDL, LDLCALC, TRIG, CHOLHDL, LDLDIRECT in the last 72 hours. Thyroid Function Tests: No results for input(s): TSH, T4TOTAL, T3FREE, THYROIDAB in the last 72 hours.  Invalid input(s): FREET3 Anemia Panel: No results for input(s): VITAMINB12, FOLATE, FERRITIN, TIBC, IRON, RETICCTPCT in the last 72 hours.  No results found.   Echo LVEF 50 to 55%  TELEMETRY: Sinus bradycardia at 57 bpm:  ASSESSMENT AND PLAN:  Principal Problem:   Atrial fibrillation with RVR (HCC) Active Problems:   Hypertension associated with diabetes (HCC)   Diabetes (HCC)   COPD (chronic obstructive pulmonary disease) (HCC)   Hyperlipidemia associated with type 2 diabetes mellitus ([place])   History of CVA (cerebrovascular accident)   Acute CHF (congestive heart failure) (HCC)   PAF (paroxysmal atrial fibrillation) ([place])    1. Paroxysmal atrial fibrillation, in sinus rhythm at 57 bpm, chads vas score of 8, on Eliquis for stroke prevention and metoprolol to tartrate for rate and rhythm control 2.  Acute diastolic congestive heart failure, improved after diuresis with IV furosemide, laying flat in bed, appears euvolemic 3.  History of CVA, status post punctate left parietal  lobe infarct 08/2018 4.  History of GI bleeding gastric ulcer 05/19/2018, hemodynamically stable, on reduced dose of Eliquis 2.5 mg twice daily  Recommendations  1.  Agree with current therapy 2.  Continue low-dose Eliquis for stroke prevention 3.  Continue low-dose metoprolol tartrate 12.5 mg twice daily for rate and rhythm control 4.  Follow-up with me 1 to 2 weeks following discharge   Isaias Cowman, MD, PhD, [facility] 03/19/2020 2:34 PM

## 2020-03-19 NOTE — Evaluation (Signed)
Physical Therapy Evaluation Patient Details Name: Alexa Lowe MRN: [id number] DOB: May 02, 1930 Today's Date: 03/19/2020   History of Present Illness  Patient is a 86 y.o. female with medical history significant for COPD, T2DM, HTN, HLD, history of CVA, essential tremor, restless leg syndrome who presents to the ED for evaluation of shortness of breath and new onset atrial fibrillation.  Clinical Impression  PT evaluation completed. Patient is pleasant and cooperative. Patient lives alone in an apartment and uses power wheelchair as primary means of mobility. Patient can ambulate a short distance with rollator at baseline with activity limited by shortness of breath and very limited standing endurance at baseline. Multiple falls reported this year.  Patient currently needs minimal assistance for bed mobility and stand pivot transfers. Patient does report dizziness during transfer. No significant increased work of breathing noted with activity and Sp02 91-94% after activity on room air, heart rate in the low 60's. Patient is deconditioned and is a high fall risk. Recommend PT to maximize independence and address functional limitations listed below. SNF is recommended for short term rehab at discharge, and patient is agreeable. Her ultimate goal is to transition back to her apartment, however she may benefit eventually from assisted living environment.      Follow Up Recommendations SNF    Equipment Recommendations  None recommended by PT    Recommendations for Other Services       Precautions / Restrictions Precautions Precautions: Fall Restrictions Weight Bearing Restrictions: No      Mobility  Bed Mobility Overal bed mobility: Needs Assistance Bed Mobility: Supine to Sit;Sit to Supine     Supine to sit: Min guard Sit to supine: Min assist   General bed mobility comments: assistance required for BLE support to return to bed. extra time required to complete tasks.     Transfers Overall transfer level: Needs assistance Equipment used: None Transfers: Stand Pivot Transfers   Stand pivot transfers: Min assist       General transfer comment: bed to and from bed side commode. steadying assistance provided during transfers with cues for safety  Ambulation/Gait             General Gait Details: limited ambulator at baseline. did not assess due to dizziness with standing activity  Stairs            Wheelchair Mobility    Modified Rankin (Stroke Patients Only)       Balance Overall balance assessment: History of Falls;Needs assistance Sitting-balance support: Feet supported Sitting balance-Leahy Scale: Good Sitting balance - Comments: good static and dynamic sitting balance   Standing balance support: Single extremity supported Standing balance-Leahy Scale: Fair Standing balance comment: patient prefers unilateral UE support with standing.                             Pertinent Vitals/Pain Pain Assessment: 0-10 Pain Score: 7  (no pain at rest, 7/10 reported after mobility) Pain Location: back pain Pain Descriptors / Indicators: Discomfort Pain Intervention(s): Monitored during session    Home Living Family/patient expects to be discharged to:: Private residence Living Arrangements: Alone Available Help at Discharge: Neighbor;Available PRN/intermittently Type of Home: Apartment Home Access: Level entry     Home Layout: One level Home Equipment: Clinical cytogeneticist - 4 wheels;Wheelchair - power      Prior Function Level of Independence: Independent with assistive device(s)         Comments: patient uses motorized wheelchair  as primary means of mobility. she can ambulate very limited distance with rollator, however is limited by endurance and shortness of breath with activity. patient usually drives and is Mod I with ADLs     Hand Dominance        Extremity/Trunk Assessment   Upper Extremity  Assessment Upper Extremity Assessment: Generalized weakness (patient reports chronic shoulder issues RUE and recent injection with ortho)    Lower Extremity Assessment Lower Extremity Assessment: Generalized weakness;LLE deficits/detail;RLE deficits/detail RLE Sensation: decreased light touch;history of peripheral neuropathy LLE Sensation: decreased light touch;history of peripheral neuropathy       Communication   Communication: No difficulties  Cognition Arousal/Alertness: Awake/alert Behavior During Therapy: WFL for tasks assessed/performed Overall Cognitive Status: Within Functional Limits for tasks assessed                                        General Comments      Exercises     Assessment/Plan    PT Assessment Patient needs continued PT services  PT Problem List Decreased strength;Decreased activity tolerance;Decreased balance;Decreased mobility;Decreased knowledge of use of DME;Cardiopulmonary status limiting activity;Decreased safety awareness;Impaired sensation       PT Treatment Interventions DME instruction;Gait training;Functional mobility training;Therapeutic activities;Therapeutic exercise;Balance training    PT Goals (Current goals can be found in the Care Plan section)  Acute Rehab PT Goals Patient Stated Goal: to be independent as long as possible PT Goal Formulation: With patient Potential to Achieve Goals: Good    Frequency Min 2X/week   Barriers to discharge Decreased caregiver support patient lives home alone. multiple falls reported this year    Co-evaluation               AM-PAC PT "6 Clicks" Mobility  Outcome Measure Help needed turning from your back to your side while in a flat bed without using bedrails?: A Little Help needed moving from lying on your back to sitting on the side of a flat bed without using bedrails?: A Little Help needed moving to and from a bed to a chair (including a wheelchair)?: A Little Help  needed standing up from a chair using your arms (e.g., wheelchair or bedside chair)?: A Little Help needed to walk in hospital room?: A Little Help needed climbing 3-5 steps with a railing? : A Lot 6 Click Score: 17    End of Session   Activity Tolerance: Patient limited by fatigue Patient left: in bed;with call bell/phone within reach;with bed alarm set Nurse Communication: Mobility status (via white board up to date with mobility status) PT Visit Diagnosis: Muscle weakness (generalized) (M62.81);Unsteadiness on feet (R26.81);History of falling (Z91.81)    Time: 4166-0630 PT Time Calculation (min) (ACUTE ONLY): 34 min   Charges:   PT Evaluation $PT Eval Moderate Complexity: 1 Mod PT Treatments $Therapeutic Activity: 23-37 mins        Minna Merritts, PT, MPT   Percell Locus 03/19/2020, 9:27 AM

## 2020-03-20 ENCOUNTER — Inpatient Hospital Stay: Payer: Medicare HMO

## 2020-03-20 LAB — GLUCOSE, CAPILLARY
Glucose-Capillary: 151 mg/dL — ABNORMAL HIGH (ref 70–99)
Glucose-Capillary: 174 mg/dL — ABNORMAL HIGH (ref 70–99)
Glucose-Capillary: 122 mg/dL — ABNORMAL HIGH (ref 70–99)
Glucose-Capillary: 180 mg/dL — ABNORMAL HIGH (ref 70–99)

## 2020-03-20 LAB — MAGNESIUM: Magnesium: 2 mg/dL (ref 1.7–2.4)

## 2020-03-20 NOTE — Progress Notes (Addendum)
PROGRESS NOTE    Alexa Lowe  RKY:[id number] DOB: 1930-10-26 DOA: 03/15/2020 PCP: Alexa Body, MD    Brief Narrative:  Alexa Lowe a 86 y.o.femalewith medical history significant forCOPD, T2DM, HTN, HLD, history of CVA, essential tremor, restless leg syndrome who presents to the ED for evaluation of shortness of breath and new onset atrial fibrillation.  12/9-pt feels a little better, cardiology was consulted 12/10-bradycardic on monitor. Lying in bed not sure if gets dizzy. Did not receive beta blk this am.  12/12-patient decided to go to SNF.  Sinus bradycardia however asymptomatic  12/13- after I saw pt , daughter expressed concern while talking to the pt mom had some slurring speech. She was mildy drawsy ,  tired for me but responded appropriately. CT scan ordered, neurocks. For nsg pt had no deficits after nsg spoke to pt's daughter via phone.  Tele: SB  Consultants:   cardiology  Procedures:   Antimicrobials:       Subjective: Little dizzy when she first got up but it went away.  No dizziness while she was standing and sitting on the commode.  Denies shortness of breath or chest  Objective: Vitals:   03/19/20 1637 03/19/20 1952 03/20/20 0514 03/20/20 0830  BP: (!) 150/69 (!) 136/59 (!) 125/57 (!) 145/65  Pulse: (!) 57 (!) 51 (!) 52 (!) 55  Resp: 16 19 18 16   Temp: 97.9 F (36.6 C) 98.2 F (36.8 C) 97.7 F (36.5 C) 98.4 F (36.9 C)  TempSrc: Oral Oral Oral   SpO2: 92% 95% 93% 97%  Weight:   95.8 kg   Height:        Intake/Output Summary (Last 24 hours) at 03/20/2020 1135 Last data filed at 03/20/2020 1022 Gross per 24 hour  Intake 240 ml  Output 401 ml  Net -161 ml   Filed Weights   03/18/20 0442 03/19/20 0346 03/20/20 0514  Weight: 95.5 kg 96.6 kg 95.8 kg    Examination: Mildly drowsy, tired, responds appropriately CTA, no wheeze rales rhonchi's Regular, S1-S2 no murmurs or gallops Soft benign positive bowel sounds No  edema bilaterally Oriented x3, grossly intact Mood and affect appropriate in current setting       Data Reviewed: I have personally reviewed following labs and imaging studies  CBC: Recent Labs  Lab 03/15/20 1859 03/16/20 0415 03/19/20 0343  WBC 8.9 8.6 7.8  NEUTROABS 7.2  --   --   HGB 10.5* 10.0* 11.2*  HCT 33.3* 31.5* 34.8*  MCV 93.5 92.6 91.6  PLT 198 198 315   Basic Metabolic Panel: Recent Labs  Lab 03/15/20 1859 03/15/20 2224 03/16/20 0415 03/17/20 0427 03/17/20 1359 03/18/20 0457 03/20/20 0454  NA 145  --  138 137  --  139  --   K 3.5  --  3.8 3.7  --  3.7  --   CL 110  --  103 96*  --  94*  --   CO2 24  --  26 31  --  33*  --   GLUCOSE 121*  --  112* 119*  --  148*  --   BUN 11  --  12 23  --  27*  --   CREATININE 0.89  --  0.91 1.05* 1.28* 1.07*  --   CALCIUM 8.8*  --  9.0 9.2  --  9.3  --   MG  --  1.1*  --   --   --  1.6* 2.0   GFR: Estimated  Creatinine Clearance: 40 mL/min (A) (by C-G formula based on SCr of 1.07 mg/dL (H)). Liver Function Tests: No results for input(s): AST, ALT, ALKPHOS, BILITOT, PROT, ALBUMIN in the last 168 hours. No results for input(s): LIPASE, AMYLASE in the last 168 hours. No results for input(s): AMMONIA in the last 168 hours. Coagulation Profile: No results for input(s): INR, PROTIME in the last 168 hours. Cardiac Enzymes: No results for input(s): CKTOTAL, CKMB, CKMBINDEX, TROPONINI in the last 168 hours. BNP (last 3 results) No results for input(s): PROBNP in the last 8760 hours. HbA1C: No results for input(s): HGBA1C in the last 72 hours. CBG: Recent Labs  Lab 03/19/20 0754 03/19/20 1126 03/19/20 1636 03/19/20 1953 03/20/20 0829  GLUCAP 144* 154* 169* 210* 151*   Lipid Profile: No results for input(s): CHOL, HDL, LDLCALC, TRIG, CHOLHDL, LDLDIRECT in the last 72 hours. Thyroid Function Tests: No results for input(s): TSH, T4TOTAL, FREET4, T3FREE, THYROIDAB in the last 72 hours. Anemia Panel: No results for  input(s): VITAMINB12, FOLATE, FERRITIN, TIBC, IRON, RETICCTPCT in the last 72 hours. Sepsis Labs: No results for input(s): PROCALCITON, LATICACIDVEN in the last 168 hours.  Recent Results (from the past 240 hour(s))  Resp Panel by RT-PCR (Flu A&B, Covid) Nasopharyngeal Swab     Status: None   Collection Time: 03/15/20  6:59 PM   Specimen: Nasopharyngeal Swab; Nasopharyngeal(NP) swabs in vial transport medium  Result Value Ref Range Status   SARS Coronavirus 2 by RT PCR NEGATIVE NEGATIVE Final    Comment: (NOTE) SARS-CoV-2 target nucleic acids are NOT DETECTED.  The SARS-CoV-2 RNA is generally detectable in upper respiratory specimens during the acute phase of infection. The lowest concentration of SARS-CoV-2 viral copies this assay can detect is 138 copies/mL. A negative result does not preclude SARS-Cov-2 infection and should not be used as the sole basis for treatment or other patient management decisions. A negative result may occur with  improper specimen collection/handling, submission of specimen other than nasopharyngeal swab, presence of viral mutation(s) within the areas targeted by this assay, and inadequate number of viral copies(<138 copies/mL). A negative result must be combined with clinical observations, patient history, and epidemiological information. The expected result is Negative.  Fact Sheet for Patients:  EntrepreneurPulse.com.au  Fact Sheet for Healthcare Providers:  IncredibleEmployment.be  This test is no t yet approved or cleared by the [place] FDA and  has been authorized for detection and/or diagnosis of SARS-CoV-2 by FDA under an Emergency Use Authorization (EUA). This EUA will remain  in effect (meaning this test can be used) for the duration of the COVID-19 declaration under Section 564(b)(1) of the Act, 21 U.S.C.section 360bbb-3(b)(1), unless the authorization is terminated  or revoked sooner.        Influenza A by PCR NEGATIVE NEGATIVE Final   Influenza B by PCR NEGATIVE NEGATIVE Final    Comment: (NOTE) The Xpert Xpress SARS-CoV-2/FLU/RSV plus assay is intended as an aid in the diagnosis of influenza from Nasopharyngeal swab specimens and should not be used as a sole basis for treatment. Nasal washings and aspirates are unacceptable for Xpert Xpress SARS-CoV-2/FLU/RSV testing.  Fact Sheet for Patients: EntrepreneurPulse.com.au  Fact Sheet for Healthcare Providers: IncredibleEmployment.be  This test is not yet approved or cleared by the [place] FDA and has been authorized for detection and/or diagnosis of SARS-CoV-2 by FDA under an Emergency Use Authorization (EUA). This EUA will remain in effect (meaning this test can be used) for the duration of the COVID-19 declaration under Section  564(b)(1) of the Act, 21 U.S.C. section 360bbb-3(b)(1), unless the authorization is terminated or revoked.  Performed at [facility], [address] [place], [place] [address]          Radiology Studies: No results found.      Scheduled Meds: . apixaban  2.5 mg Oral BID  . atorvastatin  40 mg Oral QHS  . citalopram  20 mg Oral Daily  . gabapentin  300 mg Oral TID  . insulin aspart  0-15 Units Subcutaneous TID WC  . insulin aspart  0-5 Units Subcutaneous QHS  . losartan  50 mg Oral Daily  . metoprolol succinate  12.5 mg Oral Daily  . nystatin cream   Topical BID  . pantoprazole  40 mg Oral Daily  . potassium chloride  20 mEq Oral Daily  . primidone  25 mg Oral BID  . rOPINIRole  0.25 mg Oral QHS   Continuous Infusions:   Assessment & Plan:   Principal Problem:   Atrial fibrillation with RVR (HCC) Active Problems:   Hypertension associated with diabetes ([place])   Diabetes (HCC)   COPD (chronic obstructive pulmonary disease) (HCC)   Hyperlipidemia associated with type 2 diabetes mellitus (HCC)   History of CVA  (cerebrovascular accident)   Acute CHF (congestive heart failure) (HCC)   PAF (paroxysmal atrial fibrillation) ([place])   Alexa Lowe a 86 y.o.femalewith medical history significant forCOPD, T2DM, HTN, HLD, history of CVA, essential tremor, restless leg syndrome whois admitted with new onset atrial fibrillation.  Atrial fibrillation/flutter with RVR: -New onset rate has improved  Chads vas score of 8  12/9-bradycardic at rest, but beta blk held. Looks like received 3 doses so far. 12/13- SB at rest.  Continue Toprol-XL 12.5 daily (Daughter concern about patient being on Eliquis due to concerns of fall. Had previous history of stroke. Daughter wants her to be on a lower dose Eliquis which was decreased by cardiology.) EF 50 to 55% Follow-up with cardiology as outpatient      Acute diastolic HF: -Resolved, euvolemic on exam  Continue holding Lasix for now  I's and O's  Daily weight     History of CVA: Punctate left parietal lobe infarct May 2020. Per prior neurology note, concern for embolic etiology but echocardiogram was without abnormality at that time. -As mentioned above on lower dose of Eliquis  12/13-aspirin held due to Eliquis initiation  Continue statin  ?slurring speech on phone with daughter per daughter. Neuroexam stable.  Will obtain CT head for further evaluation.    Hypomagnesemia- Repleted Mag 2.0 today    Type 2 diabetes: -BG stable Continue RISS   Hypertension: Stable Continue ARB and betal blk    Hyperlipidemia: Continue atorvastatin.  COPD: Appears to be stable without acute exacerbation . Continue albuterol as needed.  Essential tremor/restless leg syndrome: Continue primidone and ropinirole.  Depression/anxiety: Continue Celexa.   DVT prophylaxis: Eliquis Code Status: DNR Family Communication: spoke to daughter Status is: Inpatient  Remains inpatient appropriate because:Unsafe d/c plan   Dispo:  The patient is from: Home              Anticipated d/c is to: SNF              Anticipated d/c date is: 1 day              Patient currently is medically stable to d/c.unsafe discharge.SNF pending           LOS: 4 days  Time spent: 35 minutes with more than 50% on [place], MD [facility] Pager [phone]  If 7PM-7AM, please contact night-coverage 03/20/2020, 11:35 AM

## 2020-03-20 NOTE — Progress Notes (Signed)
Mobility Specialist - Progress Note   03/20/20 1700  Mobility  Range of Motion/Exercises Right leg;Left leg (ankle pumps, slr, hip iso, hip abd/add)  Level of Assistance Independent  Assistive Device None  Distance Ambulated (ft) 0 ft  Mobility Response Tolerated well  Mobility performed by Mobility specialist  $Mobility charge 1 Mobility    Pre-mobility: 60 HR, 94% SpO2 Post-mobility: 65 HR, 95% SpO2   Pt was sitting in bed upon arrival utilizing room air. Pt agreed to session. Pt denied pain or nausea. Pt was able to perform supine exercises: ankle pumps x10, straight leg raises x8, hip abduction x10, hip isometrics x3 independently. Pt stated that performing hip isometrics specifically made her feel SOB. O2 sat at 95% after hip iso performance. Overall, pt tolerated session well. Pt was left in bed with all needs in reach and alarm set.    Kathee Delton Mobility Specialist 03/20/20, 5:52 PM

## 2020-03-20 NOTE — Care Management Important Message (Signed)
Important Message  Patient Details  Name: Alexa Lowe MRN: [id number] Date of Birth: [birth year]   Medicare Important Message Given:  Yes     Dannette Barbara 03/20/2020, 11:55 AM

## 2020-03-20 NOTE — TOC Progression Note (Signed)
Transition of Care [facility]) - Progression Note    Patient Details  Name: Alexa Lowe MRN: [id number] Date of Birth: [birth year]  [facility] CM/SW Contact  Eileen Stanford, LCSW Phone Number: 03/20/2020, 2:05 PM  Clinical Narrative:  [name] can take pt, they started auth.          Expected Discharge Plan and Services                                                 Social Determinants of Health (SDOH) Interventions    Readmission Risk Interventions No flowsheet data found.

## 2020-03-20 NOTE — Progress Notes (Signed)
[facility]    SUBJECTIVE: The patient reports feeling fine this morning. She denies shortness of breath. She has chronic left chest pain, described as a dull ache, that improves if she puts her hand on her chest. This has been ongoing for months without increased severity or frequency.   Vitals:   03/19/20 1637 03/19/20 1952 03/20/20 0514 03/20/20 0830  BP: (!) 150/69 (!) 136/59 (!) 125/57 (!) 145/65  Pulse: (!) 57 (!) 51 (!) 52 (!) 55  Resp: 16 19 18 16   Temp: 97.9 F (36.6 C) 98.2 F (36.8 C) 97.7 F (36.5 C) 98.4 F (36.9 C)  TempSrc: Oral Oral Oral   SpO2: 92% 95% 93% 97%  Weight:   95.8 kg   Height:         Intake/Output Summary (Last 24 hours) at 03/20/2020 1572 Last data filed at 03/19/2020 2354 Gross per 24 hour  Intake --  Output 801 ml  Net -801 ml      PHYSICAL EXAM  General: Frail elderly female lying flat in bed in no acute distress HEENT:  Normocephalic and atramatic Neck:  No JVD.  Lungs: Clear bilaterally to auscultation, normal effort of breathing on room air. Heart: regular rhythm, bradycardic . Normal S1 and S2 without gallops or murmurs.  Abdomen: nondistended Msk:  Back normal, gait not assessed Extremities: Trace bilateral lower extremity edema.   Neuro: Alert and oriented X 3. Psych:  Good affect, responds appropriately   LABS: Basic Metabolic Panel: Recent Labs    03/17/20 1359 03/18/20 0457 03/20/20 0454  NA  --  139  --   K  --  3.7  --   CL  --  94*  --   CO2  --  33*  --   GLUCOSE  --  148*  --   BUN  --  27*  --   CREATININE 1.28* 1.07*  --   CALCIUM  --  9.3  --   MG  --  1.6* 2.0   Liver Function Tests: No results for input(s): AST, ALT, ALKPHOS, BILITOT, PROT, ALBUMIN in the last 72 hours. No results for input(s): LIPASE, AMYLASE in the last 72 hours. CBC: Recent Labs    03/19/20 0343  WBC 7.8  HGB 11.2*  HCT 34.8*  MCV 91.6  PLT 197   Cardiac Enzymes: No results for input(s): CKTOTAL, CKMB, CKMBINDEX,  TROPONINI in the last 72 hours. BNP: Invalid input(s): POCBNP D-Dimer: No results for input(s): DDIMER in the last 72 hours. Hemoglobin A1C: No results for input(s): HGBA1C in the last 72 hours. Fasting Lipid Panel: No results for input(s): CHOL, HDL, LDLCALC, TRIG, CHOLHDL, LDLDIRECT in the last 72 hours. Thyroid Function Tests: No results for input(s): TSH, T4TOTAL, T3FREE, THYROIDAB in the last 72 hours.  Invalid input(s): FREET3 Anemia Panel: No results for input(s): VITAMINB12, FOLATE, FERRITIN, TIBC, IRON, RETICCTPCT in the last 72 hours.  No results found.   Echo LVEF 50-55%, mild to moderate MR and TR  TELEMETRY: sinus rhythm, 57 bpm  ASSESSMENT AND PLAN:  Principal Problem:   Atrial fibrillation with RVR (HCC) Active Problems:   Hypertension associated with diabetes (HCC)   Diabetes (HCC)   COPD (chronic obstructive pulmonary disease) (HCC)   Hyperlipidemia associated with type 2 diabetes mellitus (HCC)   History of CVA (cerebrovascular accident)   Acute CHF (congestive heart failure) (HCC)   PAF (paroxysmal atrial fibrillation) ([place])    1. Paroxysmal atrial fibrillation, in sinus rhythm at 55-57 bpm, chads  vasc score of 8, on Eliquis for stroke prevention and metoprolol to tartrate for rate and rhythm control 2. Acute diastolic congestive heart failure, improved after diuresis with IV furosemide, laying flat in bed, appears euvolemic 3.History of CVA, status post punctate left parietal lobe infarct 08/2018 4.History of GI bleeding gastric ulcer2/02/2019, hemodynamically stable, on reduced dose of Eliquis 2.5 mg twice daily  Recommendations: 1. Continue Eliquis for stroke prevention 2. Continue low dose metoprolol succinate for rate control 3. Defer further cardiac diagnostics at this time 4. Recommend discharge with 20 mg of Lasix daily 5. Follow up with heart failure clinic as outpatient 6. Discharge today, and follow-up with Dr. Saralyn Pilar in 1-2  weeks.  Clabe Seal, PA-C 03/20/2020 8:53 AM  Sign off for now; please call/[place] with any questions.

## 2020-03-21 LAB — BASIC METABOLIC PANEL
Anion gap: 8 (ref 5–15)
BUN: 32 mg/dL — ABNORMAL HIGH (ref 8–23)
CO2: 27 mmol/L (ref 22–32)
Calcium: 9.9 mg/dL (ref 8.9–10.3)
Chloride: 101 mmol/L (ref 98–111)
Creatinine, Ser: 0.95 mg/dL (ref 0.44–1.00)
GFR, Estimated: 58 mL/min — ABNORMAL LOW (ref >60)
Glucose, Bld: 162 mg/dL — ABNORMAL HIGH (ref 70–99)
Potassium: 4.6 mmol/L (ref 3.5–5.1)
Sodium: 136 mmol/L (ref 135–145)

## 2020-03-21 LAB — RESP PANEL BY RT-PCR (FLU A&B, COVID) ARPGX2
Influenza A by PCR: NEGATIVE
Influenza B by PCR: NEGATIVE
SARS Coronavirus 2 by RT PCR: NEGATIVE

## 2020-03-21 LAB — GLUCOSE, CAPILLARY
Glucose-Capillary: 590 mg/dL (ref 70–99)
Glucose-Capillary: 216 mg/dL — ABNORMAL HIGH (ref 70–99)
Glucose-Capillary: 148 mg/dL — ABNORMAL HIGH (ref 70–99)
Glucose-Capillary: 151 mg/dL — ABNORMAL HIGH (ref 70–99)
Glucose-Capillary: 258 mg/dL — ABNORMAL HIGH (ref 70–99)

## 2020-03-21 MED ORDER — APIXABAN 2.5 MG PO TABS: 2.5000 mg | ORAL_TABLET | Freq: Two times a day (BID) | ORAL | Status: AC

## 2020-03-21 MED ORDER — NYSTATIN 100000 UNIT/GM EX CREA: TOPICAL_CREAM | Freq: Two times a day (BID) | CUTANEOUS | 0 refills | Status: AC

## 2020-03-21 MED ORDER — ATORVASTATIN CALCIUM 40 MG PO TABS: 40.0000 mg | ORAL_TABLET | Freq: Every day | ORAL | Status: AC

## 2020-03-21 MED ORDER — METOPROLOL SUCCINATE ER 25 MG PO TB24: 12.5000 mg | ORAL_TABLET | Freq: Every day | ORAL | Status: DC

## 2020-03-21 NOTE — Progress Notes (Signed)
Mobility Specialist - Progress Note   03/21/20 1600  Mobility  Range of Motion/Exercises Right leg;Left leg (ankle pumps, slr, hip abduction, quad sets)  Level of Assistance Independent  Assistive Device None  Distance Ambulated (ft) 0 ft  Mobility Response Tolerated well  Mobility performed by Mobility specialist  $Mobility charge 1 Mobility    Pre-mobility: 55 HR, 96% SpO2 Post-mobility: 53 HR, 97% SpO2   Pt was sitting in recliner upon arrival. Pt agreed to session. Pt denied pain or fatigue, but did voice that she experiences dizziness while standing extensive periods. Pt performed exercises: ankle pumps, quad sets, straight leg raises, hip abduction, and crossover reach on L arm only. Pt expressed limited ROM on RUE. Overall, pt tolerated session well. Pt was left in bed with all needs in reach and alarm set.    Kathee Delton Mobility Specialist 03/21/20, 4:37 PM

## 2020-03-21 NOTE — Discharge Summary (Addendum)
Alexa Lowe JSH:[id number] DOB: Oct 04, 1930 DOA: 03/15/2020  PCP: Dion Body, MD  Admit date: 03/15/2020 Discharge date: 03/21/2020  Admitted From: Home Disposition:  SNF  Recommendations for Outpatient Follow-up:  1. Follow up with PCP in 1 week 2. Please obtain BMP/CBC in one week 3. Cardiology Dr. Saralyn Pilar in one week 4. Check potassium level in am     Discharge Condition:Stable CODE STATUS:DNR  Diet recommendation: Heart Healthy Brief/Interim Summary: Per HPI: Alexa Lowe is a 86 y.o. female with medical history significant for COPD, T2DM, HTN, HLD, history of CVA, essential tremor, restless leg syndrome who presents to the ED for evaluation of shortness of breath and new onset atrial fibrillation.Patient stated she has had 3 days of new onset shortness of breath, dyspnea on minimal exertion, and intermittent central chest discomfort which is nonradiating.  She stated, she has had associated orthopnea and swelling in both of her legs.  She reported  had noticed a 10 pound weight gain over the last 2 weeks.  She has had a nonproductive cough.  She takes aspirin 81 mg daily.  She denied any obvious bleeding.  She denies any known personal history of CHF or atrial fibrillation.  She says she normally transports with the use of a wheelchair but does occasionally ambulate short distances with assistance.Patient was found with atrial fibrillation with RVR on admission. Cardiology was consulted.She converted to sinus bradycardia and doing better.   Atrial fibrillation/flutter with RVR: -New onset rate has improved , now with v Chads vas score of 8 Continue Toprol-XL 12.5 daily...>if tolerates it, need to HOLD if sbp <100 or HR <60. (Daughter concern about patient being on Eliquis due to concerns of fall. Had previous history of stroke. 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 Per prior neurology note, concern for embolic etiology but echocardiogram was without abnormality at that time. -As mentioned above on lower dose of Eliquis  Continue statin  Couple of days ago ?slurring speech on phone with daughter per daughter. Neuroexam remained stable CT of the head was obtained without any acute infarct, mass or hemorrhage    Hypomagnesemia- Repleted and resolved Level 2.0  Type 2 diabetes: Blood glucose levels stable Continue glycemic checks Continue outpatient p.o. medication   Hypertension: Stable Continue ARB and betal blk    Hyperlipidemia: Continue statins  COPD: Appears to be stable withoutacute exacerbation .   Essential tremor/restless leg syndrome: Continue primidone and ropinirole.  Depression/anxiety: Continue Celexa.    Discharge Diagnoses:  Principal Problem:   Atrial fibrillation with RVR ([place]) Active Problems:   Hypertension associated with diabetes ([place])   Diabetes ([place])   COPD (chronic obstructive pulmonary disease) ([facility])   Hyperlipidemia associated with type 2 diabetes mellitus ([place])   History of CVA (cerebrovascular accident)   Acute CHF (congestive heart failure) (H[facility])   PAF (paroxysmal atrial fibrillation) ([place])    Discharge Instructions   Allergies as of 03/21/2020      Reactions   Morphine And Related Other (See Comments)   Reaction:  Hallucinations    Ace Inhibitors Other (See Comments)   Doxycycline Other (  See Comments)   Caused GI upset   Erythromycin Ethylsuccinate Other (See Comments)   Codeine Nausea And Vomiting      Medication List    STOP taking these medications   aspirin 81 MG EC tablet     TAKE these  medications   allopurinol 300 MG tablet Commonly known as: ZYLOPRIM Take 300 mg by mouth daily.   apixaban 2.5 MG Tabs tablet Commonly known as: ELIQUIS Take 1 tablet (2.5 mg total) by mouth 2 (two) times daily.   atorvastatin 40 MG tablet Commonly known as: LIPITOR Take 1 tablet (40 mg total) by mouth at bedtime. What changed: when to take this   citalopram 20 MG tablet Commonly known as: CELEXA Take 20 mg by mouth daily.   gabapentin 300 MG capsule Commonly known as: NEURONTIN Take 300 mg by mouth 3 (three) times daily.   losartan 50 MG tablet Commonly known as: COZAAR Take 50 mg by mouth daily.   metFORMIN 500 MG tablet Commonly known as: GLUCOPHAGE Take 500 mg by mouth in the morning and at bedtime.   metoprolol succinate 25 MG 24 hr tablet Commonly known as: TOPROL-XL Take 0.5 tablets (12.5 mg total) by mouth daily. Start taking on: March 22, 2020   nystatin cream Commonly known as: MYCOSTATIN Apply topically 2 (two) times daily.   pantoprazole 40 MG tablet Commonly known as: PROTONIX Take 40 mg by mouth daily.   primidone 50 MG tablet Commonly known as: MYSOLINE Take 25 mg by mouth 2 (two) times daily.   rOPINIRole 0.25 MG tablet Commonly known as: REQUIP Take 0.25 mg by mouth at bedtime.       Contact information for follow-up providers    [facility] Follow up on 03/24/2020.   Specialty: Cardiology Why: at 9:00am. Enter through the [facility] [place] entrance Contact information: [place] [phone]       Isaias Cowman, MD On 03/28/2020.   Specialty: Cardiology Why: @ 11:45am Contact information: [address] [facility] [address] [phone]        Dion Body, MD Follow up in 1 week(s).   Specialty: Family Medicine Contact information: [place] [address] [phone]            Contact information for after-discharge care    [facility] .   Service: Skilled Nursing Contact information: [place] [phone]                 Allergies  Allergen Reactions  . Morphine And Related Other (See Comments)    Reaction:  Hallucinations   . Ace Inhibitors Other (See Comments)  . Doxycycline Other (See Comments)    Caused GI upset  . Erythromycin Ethylsuccinate Other (See Comments)  . Codeine Nausea And Vomiting    Consultations:  Cardiology   Procedures/Studies: DG Chest 2 View  Result Date: 03/15/2020 CLINICAL DATA:  Short of breath EXAM: CHEST - 2 VIEW COMPARISON:  06/16/2019 FINDINGS: Small bilateral pleural effusions. Mild airspace disease at the bases presumably atelectasis. Normal cardiac size. No pneumothorax. Surgical hardware in the cervical spine IMPRESSION: Small bilateral pleural effusions with bibasilar atelectasis. Electronically Signed   By: Donavan Foil M.D.   On: 03/15/2020 17:49   CT HEAD WO CONTRAST  Result Date: 03/20/2020 CLINICAL DATA:  Intermittent  slurred speech EXAM: CT HEAD WITHOUT CONTRAST TECHNIQUE: Contiguous axial images were obtained from the base of the skull through the vertex without intravenous contrast. COMPARISON:  Sep 05, 2019 FINDINGS: Brain: There is stable mild diffuse atrophy. There is no intracranial mass, hemorrhage, extra-axial fluid collection, or midline shift. There is patchy small vessel disease in the centra semiovale bilaterally. Elsewhere brain parenchyma appears unremarkable. No acute infarct is demonstrable. Vascular: No hyperdense vessel. There is calcification in each carotid siphon region. Skull: The bony calvarium appears intact. Sinuses/Orbits: Visualized paranasal sinuses are clear. Orbits appear symmetric bilaterally. Other: Mastoid air cells are clear.  IMPRESSION: Atrophy with patchy periventricular small vessel disease, stable. No acute infarct demonstrable. No mass or hemorrhage. Foci of arterial vascular calcification noted. Electronically Signed   By: Lowella Grip III M.D.   On: 03/20/2020 12:30   ECHOCARDIOGRAM COMPLETE  Result Date: 03/16/2020    ECHOCARDIOGRAM REPORT   Patient Name:   KRUPA STEGE Date of Exam: 03/16/2020 Medical Rec #:  [id number]       Height:       62.0 in Accession #:    [id number]      Weight:       230.0 lb Date of Birth:  October 03, 1930       BSA:          2.029 m Patient Age:    12 years        BP:           149/63 mmHg Patient Gender: F               HR:           62 bpm. Exam Location:  [facility] Procedure: 2D Echo, Color Doppler, Cardiac Doppler and Intracardiac            Opacification Agent Indications:     I48.91 Atrial fibrillation  History:         Patient has prior history of Echocardiogram examinations, most                  recent 08/25/2018. COPD; Risk Factors:Hypertension and Diabetes.  Sonographer:     Charmayne Sheer RDCS (AE) Referring Phys:  [id number] Cleaster Corin PATEL Diagnosing Phys: Isaias Cowman MD  Sonographer Comments: Suboptimal apical window and suboptimal subcostal window. Image acquisition challenging due to patient body habitus, Image acquisition challenging due to mastectomy and Image acquisition challenging due to COPD. IMPRESSIONS  1. Left ventricular ejection fraction, by estimation, is 50 to 55%. The left ventricle has low normal function. The left ventricle has no regional wall motion abnormalities. Left ventricular diastolic parameters were normal.  2. Right ventricular systolic function is normal. The right ventricular size is normal.  3. Right atrial size was moderately dilated.  4. The mitral valve is normal in structure. Mild to moderate mitral valve regurgitation. No evidence of mitral stenosis.  5. Tricuspid valve regurgitation is mild to moderate.  6. The aortic valve is normal in structure.  Aortic valve regurgitation is not visualized. No aortic stenosis is present.  7. The inferior vena cava is normal in size with greater than 50% respiratory variability, suggesting right atrial pressure of 3 mmHg. FINDINGS  Left Ventricle: Left ventricular ejection fraction, by estimation, is 50 to 55%. The left ventricle has low normal function. The left ventricle has no regional wall motion abnormalities. Definity contrast agent was given IV to delineate the left ventricular endocardial borders. The left ventricular internal cavity size was normal  in size. There is no left ventricular hypertrophy. Left ventricular diastolic parameters were normal. Right Ventricle: The right ventricular size is normal. No increase in right ventricular wall thickness. Right ventricular systolic function is normal. Left Atrium: Left atrial size was normal in size. Right Atrium: Right atrial size was moderately dilated. Pericardium: There is no evidence of pericardial effusion. Mitral Valve: The mitral valve is normal in structure. Mild to moderate mitral valve regurgitation. No evidence of mitral valve stenosis. MV peak gradient, 5.1 mmHg. The mean mitral valve gradient is 2.0 mmHg. Tricuspid Valve: The tricuspid valve is normal in structure. Tricuspid valve regurgitation is mild to moderate. No evidence of tricuspid stenosis. Aortic Valve: The aortic valve is normal in structure. Aortic valve regurgitation is not visualized. No aortic stenosis is present. Aortic valve mean gradient measures 4.0 mmHg. Aortic valve peak gradient measures 8.8 mmHg. Aortic valve area, by VTI measures 1.46 cm. Pulmonic Valve: The pulmonic valve was normal in structure. Pulmonic valve regurgitation is not visualized. No evidence of pulmonic stenosis. Aorta: The aortic root is normal in size and structure. Venous: The inferior vena cava is normal in size with greater than 50% respiratory variability, suggesting right atrial pressure of 3 mmHg. IAS/Shunts:  No atrial level shunt detected by color flow Doppler.  LEFT VENTRICLE PLAX 2D LVIDd:         5.34 cm  Diastology LVIDs:         3.92 cm  LV e' medial:    8.05 cm/s LV PW:         0.87 cm  LV E/e' medial:  14.5 LV IVS:        0.72 cm  LV e' lateral:   6.74 cm/s LVOT diam:     1.70 cm  LV E/e' lateral: 17.4 LV SV:         49 LV SV Index:   24 LVOT Area:     2.27 cm  LEFT ATRIUM             Index LA diam:        4.20 cm 2.07 cm/m LA Vol (A2C):   34.0 ml 16.76 ml/m LA Vol (A4C):   64.2 ml 31.65 ml/m LA Biplane Vol: 51.9 ml 25.58 ml/m  AORTIC VALVE                   PULMONIC VALVE AV Area (Vmax):    1.34 cm    PV Vmax:       1.31 m/s AV Area (Vmean):   1.39 cm    PV Vmean:      79.600 cm/s AV Area (VTI):     1.46 cm    PV VTI:        0.258 m AV Vmax:           148.00 cm/s PV Peak grad:  6.9 mmHg AV Vmean:          91.300 cm/s PV Mean grad:  3.0 mmHg AV VTI:            0.334 m AV Peak Grad:      8.8 mmHg AV Mean Grad:      4.0 mmHg LVOT Vmax:         87.50 cm/s LVOT Vmean:        56.000 cm/s LVOT VTI:          0.215 m LVOT/AV VTI ratio: 0.64  AORTA Ao Root diam: 2.90 cm MITRAL VALVE MV Area (PHT): 3.42  cm     SHUNTS MV Peak grad:  5.1 mmHg     Systemic VTI:  0.22 m MV Mean grad:  2.0 mmHg     Systemic Diam: 1.70 cm MV Vmax:       1.13 m/s MV Vmean:      58.4 cm/s MV Decel Time: 222 msec MV E velocity: 117.00 cm/s MV A velocity: 57.40 cm/s MV E/A ratio:  2.04 Isaias Cowman MD Electronically signed by Isaias Cowman MD Signature Date/Time: 03/16/2020/1:20:13 PM    Final       Subjective: Has no complaints.  Gets a little dizzy when she gets up too fast otherwise no dizziness with standing.  Denies shortness of breath, chest pain or palpitations  Discharge Exam: Vitals:   03/21/20 0734 03/21/20 1228  BP: (!) 118/46 (!) 148/82  Pulse: (!) 47 60  Resp: 18 18  Temp: (!) 97.5 F (36.4 C) 97.6 F (36.4 C)  SpO2: 97% 96%   Vitals:   03/20/20 1925 03/21/20 0353 03/21/20 0734 03/21/20 1228   BP: (!) 130/52 (!) 127/56 (!) 118/46 (!) 148/82  Pulse: (!) 53 (!) 51 (!) 47 60  Resp: 18 16 18 18   Temp: 98.4 F (36.9 C) (!) 97.4 F (36.3 C) (!) 97.5 F (36.4 C) 97.6 F (36.4 C)  TempSrc: Oral Oral Oral   SpO2: 99% 91% 97% 96%  Weight:  95.3 kg    Height:        General: Pt is alert, awake, not in acute distress Cardiovascular: RRR, S1/S2 +, no rubs, no gallops Respiratory: CTA bilaterally, no wheezing, no rhonchi Abdominal: Soft, NT, ND, bowel sounds + Extremities: no edema, no cyanosis    The results of significant diagnostics from this hospitalization (including imaging, microbiology, ancillary and laboratory) are listed below for reference.     Microbiology: Recent Results (from the past 240 hour(s))  Resp Panel by RT-PCR (Flu A&B, Covid) Nasopharyngeal Swab     Status: None   Collection Time: 03/15/20  6:59 PM   Specimen: Nasopharyngeal Swab; Nasopharyngeal(NP) swabs in vial transport medium  Result Value Ref Range Status   SARS Coronavirus 2 by RT PCR NEGATIVE NEGATIVE Final    Comment: (NOTE) SARS-CoV-2 target nucleic acids are NOT DETECTED.  The SARS-CoV-2 RNA is generally detectable in upper respiratory specimens during the acute phase of infection. The lowest concentration of SARS-CoV-2 viral copies this assay can detect is 138 copies/mL. A negative result does not preclude SARS-Cov-2 infection and should not be used as the sole basis for treatment or other patient management decisions. A negative result may occur with  improper specimen collection/handling, submission of specimen other than nasopharyngeal swab, presence of viral mutation(s) within the areas targeted by this assay, and inadequate number of viral copies(<138 copies/mL). A negative result must be combined with clinical observations, patient history, and epidemiological information. The expected result is Negative.  Fact Sheet for Patients:   EntrepreneurPulse.com.au  Fact Sheet for Healthcare Providers:  IncredibleEmployment.be  This test is no t yet approved or cleared by the [place] FDA and  has been authorized for detection and/or diagnosis of SARS-CoV-2 by FDA under an Emergency Use Authorization (EUA). This EUA will remain  in effect (meaning this test can be used) for the duration of the COVID-19 declaration under Section 564(b)(1) of the Act, 21 U.S.C.section 360bbb-3(b)(1), unless the authorization is terminated  or revoked sooner.       Influenza A by PCR NEGATIVE NEGATIVE Final   Influenza  B by PCR NEGATIVE NEGATIVE Final    Comment: (NOTE) The Xpert Xpress SARS-CoV-2/FLU/RSV plus assay is intended as an aid in the diagnosis of influenza from Nasopharyngeal swab specimens and should not be used as a sole basis for treatment. Nasal washings and aspirates are unacceptable for Xpert Xpress SARS-CoV-2/FLU/RSV testing.  Fact Sheet for Patients: EntrepreneurPulse.com.au  Fact Sheet for Healthcare Providers: IncredibleEmployment.be  This test is not yet approved or cleared by the [place] FDA and has been authorized for detection and/or diagnosis of SARS-CoV-2 by FDA under an Emergency Use Authorization (EUA). This EUA will remain in effect (meaning this test can be used) for the duration of the COVID-19 declaration under Section 564(b)(1) of the Act, 21 U.S.C. section 360bbb-3(b)(1), unless the authorization is terminated or revoked.  Performed at [facility], [address] [place], [place] [address]      Labs: BNP (last 3 results) Recent Labs    03/15/20 1859 03/17/20 0427  BNP 250.9* 211.9*   Basic Metabolic Panel: Recent Labs  Lab 03/15/20 1859 03/15/20 2224 03/16/20 0415 03/17/20 0427 03/17/20 1359 03/18/20 0457 03/20/20 0454 03/21/20 0908  NA 145  --  138 137  --  139  --  136  K 3.5  --   3.8 3.7  --  3.7  --  4.6  CL 110  --  103 96*  --  94*  --  101  CO2 24  --  26 31  --  33*  --  27  GLUCOSE 121*  --  112* 119*  --  148*  --  162*  BUN 11  --  12 23  --  27*  --  32*  CREATININE 0.89  --  0.91 1.05* 1.28* 1.07*  --  0.95  CALCIUM 8.8*  --  9.0 9.2  --  9.3  --  9.9  MG  --  1.1*  --   --   --  1.6* 2.0  --    Liver Function Tests: No results for input(s): AST, ALT, ALKPHOS, BILITOT, PROT, ALBUMIN in the last 168 hours. No results for input(s): LIPASE, AMYLASE in the last 168 hours. No results for input(s): AMMONIA in the last 168 hours. CBC: Recent Labs  Lab 03/15/20 1859 03/16/20 0415 03/19/20 0343  WBC 8.9 8.6 7.8  NEUTROABS 7.2  --   --   HGB 10.5* 10.0* 11.2*  HCT 33.3* 31.5* 34.8*  MCV 93.5 92.6 91.6  PLT 198 198 197   Cardiac Enzymes: No results for input(s): CKTOTAL, CKMB, CKMBINDEX, TROPONINI in the last 168 hours. BNP: Invalid input(s): POCBNP CBG: Recent Labs  Lab 03/20/20 1610 03/20/20 2124 03/21/20 0737 03/21/20 0951 03/21/20 1224  GLUCAP 122* 180* 590* 216* 148*   D-Dimer No results for input(s): DDIMER in the last 72 hours. Hgb A1c No results for input(s): HGBA1C in the last 72 hours. Lipid Profile No results for input(s): CHOL, HDL, LDLCALC, TRIG, CHOLHDL, LDLDIRECT in the last 72 hours. Thyroid function studies No results for input(s): TSH, T4TOTAL, T3FREE, THYROIDAB in the last 72 hours.  Invalid input(s): FREET3 Anemia work up No results for input(s): VITAMINB12, FOLATE, FERRITIN, TIBC, IRON, RETICCTPCT in the last 72 hours. Urinalysis    Component Value Date/Time   COLORURINE YELLOW (A) 08/24/2018 0421   APPEARANCEUR CLEAR (A) 08/24/2018 0421   APPEARANCEUR Clear 12/08/2012 1942   LABSPEC 1.020 08/24/2018 0421   LABSPEC 1.017 12/08/2012 1942   PHURINE 5.0 08/24/2018 0421   GLUCOSEU NEGATIVE  08/24/2018 0421   GLUCOSEU Negative 12/08/2012 1942   HGBUR NEGATIVE 08/24/2018 0421   BILIRUBINUR NEGATIVE 08/24/2018 0421    BILIRUBINUR Negative 12/08/2012 1942   KETONESUR NEGATIVE 08/24/2018 0421   PROTEINUR NEGATIVE 08/24/2018 0421   NITRITE NEGATIVE 08/24/2018 0421   LEUKOCYTESUR NEGATIVE 08/24/2018 0421   LEUKOCYTESUR Negative 12/08/2012 1942   Sepsis Labs Invalid input(s): PROCALCITONIN,  WBC,  LACTICIDVEN Microbiology Recent Results (from the past 240 hour(s))  Resp Panel by RT-PCR (Flu A&B, Covid) Nasopharyngeal Swab     Status: None   Collection Time: 03/15/20  6:59 PM   Specimen: Nasopharyngeal Swab; Nasopharyngeal(NP) swabs in vial transport medium  Result Value Ref Range Status   SARS Coronavirus 2 by RT PCR NEGATIVE NEGATIVE Final    Comment: (NOTE) SARS-CoV-2 target nucleic acids are NOT DETECTED.  The SARS-CoV-2 RNA is generally detectable in upper respiratory specimens during the acute phase of infection. The lowest concentration of SARS-CoV-2 viral copies this assay can detect is 138 copies/mL. A negative result does not preclude SARS-Cov-2 infection and should not be used as the sole basis for treatment or other patient management decisions. A negative result may occur with  improper specimen collection/handling, submission of specimen other than nasopharyngeal swab, presence of viral mutation(s) within the areas targeted by this assay, and inadequate number of viral copies(<138 copies/mL). A negative result must be combined with clinical observations, patient history, and epidemiological information. The expected result is Negative.  Fact Sheet for Patients:  EntrepreneurPulse.com.au  Fact Sheet for Healthcare Providers:  IncredibleEmployment.be  This test is no t yet approved or cleared by the [place] FDA and  has been authorized for detection and/or diagnosis of SARS-CoV-2 by FDA under an Emergency Use Authorization (EUA). This EUA will remain  in effect (meaning this test can be used) for the duration of the COVID-19 declaration  under Section 564(b)(1) of the Act, 21 U.S.C.section 360bbb-3(b)(1), unless the authorization is terminated  or revoked sooner.       Influenza A by PCR NEGATIVE NEGATIVE Final   Influenza B by PCR NEGATIVE NEGATIVE Final    Comment: (NOTE) The Xpert Xpress SARS-CoV-2/FLU/RSV plus assay is intended as an aid in the diagnosis of influenza from Nasopharyngeal swab specimens and should not be used as a sole basis for treatment. Nasal washings and aspirates are unacceptable for Xpert Xpress SARS-CoV-2/FLU/RSV testing.  Fact Sheet for Patients: EntrepreneurPulse.com.au  Fact Sheet for Healthcare Providers: IncredibleEmployment.be  This test is not yet approved or cleared by the [place] FDA and has been authorized for detection and/or diagnosis of SARS-CoV-2 by FDA under an Emergency Use Authorization (EUA). This EUA will remain in effect (meaning this test can be used) for the duration of the COVID-19 declaration under Section 564(b)(1) of the Act, 21 U.S.C. section 360bbb-3(b)(1), unless the authorization is terminated or revoked.  Performed at [facility], [address] [place], [place] [address]      Time coordinating discharge: Over 30 minutes  SIGNED:   Nolberto Hanlon, MD  [facility] 03/21/2020, 2:12 PM

## 2020-03-21 NOTE — TOC Progression Note (Signed)
Transition of Care [facility]) - Progression Note    Patient Details  Name: Alexa Lowe MRN: [id number] Date of Birth: 07/04/1930  Transition of Care [facility]) CM/SW [place], LCSW Phone Number: 03/21/2020, 1:36 PM  Clinical Narrative:   Alexa Lowe has started insurance auth 12/13--[facility] contacted SNF requesting updated PT notes--notes sent, awaiting decision.         Expected Discharge Plan and Services                                                 Social Determinants of Health (SDOH) Interventions    Readmission Risk Interventions No flowsheet data found.

## 2020-03-21 NOTE — Progress Notes (Signed)
Physical Therapy Treatment Patient Details Name: Alexa Lowe MRN: [id number] DOB: March 12, 1931 Today's Date: 03/21/2020    History of Present Illness Patient is a 86 y.o. female with medical history significant for COPD, T2DM, HTN, HLD, history of CVA, essential tremor, restless leg syndrome who presents to the ED for evaluation of shortness of breath and new onset atrial fibrillation.    PT Comments    Pt seen this afternoon for continued skilled PT services. Pt sitting unsupported at edge of bed after utilizing bedside commode. Pt requesting to get out of bed.  Recliner brought to room, discussed POC and pt goals. Pt stated she remains in her w/c during the day at home and is independent with transfers at home.  Pt able to demonstrate stand pivot transfer from bed to chair with short shuffling steps and light CGA for safety. Pt demonstrated good technique despite fatigue.  Reviewed B LE strengthening exercises to be continued independently.  Pt appears very motivated to comply with PT set goals in order to return home. Pt will benefit from short term rehab stay upon d/c from hospital in order to maximize independent functional mobility and return home safely.  Follow Up Recommendations  SNF     Equipment Recommendations   (Pt has multiple walkers and a personal w/c at home)    Recommendations for Other Services       Precautions / Restrictions Precautions Precautions: Fall Restrictions Weight Bearing Restrictions: No    Mobility  Bed Mobility Overal bed mobility: Needs Assistance Bed Mobility: Supine to Sit;Sit to Supine     Supine to sit: Min guard Sit to supine: Min assist   General bed mobility comments: assistance required for BLE support to return to bed. extra time required to complete tasks.  Transfers Overall transfer level: Needs assistance Equipment used: None Transfers: Stand Pivot Transfers   Stand pivot transfers: Min guard       General transfer  comment:  (bed to bedside chair)  Ambulation/Gait Ambulation/Gait assistance:  (At baseline, pt is independent with w/c mobility)               Stairs             Wheelchair Mobility    Modified Rankin (Stroke Patients Only)       Balance   Sitting-balance support: Feet supported Sitting balance-Leahy Scale: Good Sitting balance - Comments: good static and dynamic sitting balance                                    Cognition Arousal/Alertness: Awake/alert Behavior During Therapy: WFL for tasks assessed/performed Overall Cognitive Status: Within Functional Limits for tasks assessed                                 General Comments: Very pleasant, requesting to sit up in chair      Exercises      General Comments General comments (skin integrity, edema, etc.): Pt c/o back being itchy, lotion applied      Pertinent Vitals/Pain Pain Assessment: No/denies pain    Home Living                      Prior Function            PT Goals (current goals can now be found in the care  plan section) Acute Rehab PT Goals Patient Stated Goal: to be independent as long as possible Progress towards PT goals: Progressing toward goals    Frequency    Min 2X/week      PT Plan      Co-evaluation              AM-PAC PT "6 Clicks" Mobility   Outcome Measure  Help needed turning from your back to your side while in a flat bed without using bedrails?: A Little Help needed moving from lying on your back to sitting on the side of a flat bed without using bedrails?: A Little Help needed moving to and from a bed to a chair (including a wheelchair)?: A Little Help needed standing up from a chair using your arms (e.g., wheelchair or bedside chair)?: A Little Help needed to walk in hospital room?: A Little Help needed climbing 3-5 steps with a railing? : A Lot 6 Click Score: 17    End of Session Equipment Utilized During  Treatment: Gait belt Activity Tolerance: Patient limited by fatigue Patient left: in chair;with call bell/phone within reach Nurse Communication: Mobility status PT Visit Diagnosis: Muscle weakness (generalized) (M62.81);Unsteadiness on feet (R26.81);History of falling (Z91.81)     Time: 7373-6681 PT Time Calculation (min) (ACUTE ONLY): 45 min  Charges:  $Therapeutic Activity: 38-52 mins                     Mikel Cella, PTA   [name] 03/21/2020, 12:53 PM

## 2020-03-22 DIAGNOSIS — E785 Hyperlipidemia, unspecified: Secondary | ICD-10-CM | POA: Diagnosis not present

## 2020-03-22 DIAGNOSIS — Z8249 Family history of ischemic heart disease and other diseases of the circulatory system: Secondary | ICD-10-CM | POA: Diagnosis not present

## 2020-03-22 DIAGNOSIS — G2581 Restless legs syndrome: Secondary | ICD-10-CM | POA: Diagnosis not present

## 2020-03-22 DIAGNOSIS — R5381 Other malaise: Secondary | ICD-10-CM | POA: Diagnosis not present

## 2020-03-22 DIAGNOSIS — I4891 Unspecified atrial fibrillation: Secondary | ICD-10-CM | POA: Diagnosis not present

## 2020-03-22 DIAGNOSIS — Z8673 Personal history of transient ischemic attack (TIA), and cerebral infarction without residual deficits: Secondary | ICD-10-CM | POA: Diagnosis not present

## 2020-03-22 DIAGNOSIS — M6281 Muscle weakness (generalized): Secondary | ICD-10-CM | POA: Diagnosis not present

## 2020-03-22 DIAGNOSIS — I509 Heart failure, unspecified: Secondary | ICD-10-CM | POA: Diagnosis not present

## 2020-03-22 DIAGNOSIS — E1149 Type 2 diabetes mellitus with other diabetic neurological complication: Secondary | ICD-10-CM | POA: Diagnosis not present

## 2020-03-22 DIAGNOSIS — Z7901 Long term (current) use of anticoagulants: Secondary | ICD-10-CM | POA: Diagnosis not present

## 2020-03-22 DIAGNOSIS — J449 Chronic obstructive pulmonary disease, unspecified: Secondary | ICD-10-CM | POA: Diagnosis not present

## 2020-03-22 DIAGNOSIS — E1122 Type 2 diabetes mellitus with diabetic chronic kidney disease: Secondary | ICD-10-CM | POA: Diagnosis not present

## 2020-03-22 DIAGNOSIS — Z9889 Other specified postprocedural states: Secondary | ICD-10-CM | POA: Diagnosis not present

## 2020-03-22 DIAGNOSIS — R0602 Shortness of breath: Secondary | ICD-10-CM | POA: Diagnosis not present

## 2020-03-22 DIAGNOSIS — Z79899 Other long term (current) drug therapy: Secondary | ICD-10-CM | POA: Diagnosis not present

## 2020-03-22 DIAGNOSIS — Z87891 Personal history of nicotine dependence: Secondary | ICD-10-CM | POA: Diagnosis not present

## 2020-03-22 DIAGNOSIS — I480 Paroxysmal atrial fibrillation: Secondary | ICD-10-CM | POA: Diagnosis not present

## 2020-03-22 DIAGNOSIS — G459 Transient cerebral ischemic attack, unspecified: Secondary | ICD-10-CM | POA: Diagnosis not present

## 2020-03-22 DIAGNOSIS — K219 Gastro-esophageal reflux disease without esophagitis: Secondary | ICD-10-CM | POA: Diagnosis not present

## 2020-03-22 DIAGNOSIS — G250 Essential tremor: Secondary | ICD-10-CM | POA: Diagnosis not present

## 2020-03-22 DIAGNOSIS — N182 Chronic kidney disease, stage 2 (mild): Secondary | ICD-10-CM | POA: Diagnosis not present

## 2020-03-22 DIAGNOSIS — R079 Chest pain, unspecified: Secondary | ICD-10-CM | POA: Diagnosis not present

## 2020-03-22 DIAGNOSIS — Z7984 Long term (current) use of oral hypoglycemic drugs: Secondary | ICD-10-CM | POA: Diagnosis not present

## 2020-03-22 DIAGNOSIS — R279 Unspecified lack of coordination: Secondary | ICD-10-CM | POA: Diagnosis not present

## 2020-03-22 DIAGNOSIS — R059 Cough, unspecified: Secondary | ICD-10-CM | POA: Diagnosis not present

## 2020-03-22 DIAGNOSIS — I110 Hypertensive heart disease with heart failure: Secondary | ICD-10-CM | POA: Diagnosis not present

## 2020-03-22 DIAGNOSIS — E7800 Pure hypercholesterolemia, unspecified: Secondary | ICD-10-CM | POA: Diagnosis not present

## 2020-03-22 DIAGNOSIS — R42 Dizziness and giddiness: Secondary | ICD-10-CM | POA: Diagnosis not present

## 2020-03-22 DIAGNOSIS — I5032 Chronic diastolic (congestive) heart failure: Secondary | ICD-10-CM | POA: Diagnosis not present

## 2020-03-22 DIAGNOSIS — E1140 Type 2 diabetes mellitus with diabetic neuropathy, unspecified: Secondary | ICD-10-CM | POA: Diagnosis not present

## 2020-03-22 DIAGNOSIS — R0789 Other chest pain: Secondary | ICD-10-CM | POA: Diagnosis not present

## 2020-03-22 DIAGNOSIS — I10 Essential (primary) hypertension: Secondary | ICD-10-CM | POA: Diagnosis not present

## 2020-03-22 DIAGNOSIS — E1169 Type 2 diabetes mellitus with other specified complication: Secondary | ICD-10-CM | POA: Diagnosis not present

## 2020-03-22 DIAGNOSIS — E119 Type 2 diabetes mellitus without complications: Secondary | ICD-10-CM | POA: Diagnosis not present

## 2020-03-22 LAB — POTASSIUM: Potassium: 4.5 mmol/L (ref 3.5–5.1)

## 2020-03-22 LAB — GLUCOSE, CAPILLARY
Glucose-Capillary: 177 mg/dL — ABNORMAL HIGH (ref 70–99)
Glucose-Capillary: 107 mg/dL — ABNORMAL HIGH (ref 70–99)

## 2020-03-22 NOTE — Progress Notes (Signed)
D/c summary from day prior, 03/21/20 is up to date.  D/c date in 03/22/20. D/c was held to pt not having insurance auth on 03/21/20.  This is a non-billable note

## 2020-03-22 NOTE — Progress Notes (Signed)
Attempted to call report. Left my number. I was told that the DON would call back to get report.

## 2020-03-22 NOTE — TOC Transition Note (Signed)
Transition of Care [facility]) - CM/SW Discharge Note   Patient Details  Name: Alexa Lowe MRN: [id number] Date of Birth: May 28, 1930  Transition of Care [facility]) CM/SW Contact:  Eileen Stanford, LCSW Phone Number: 03/22/2020, 11:26 AM   Clinical Narrative:    Clinical Social Worker facilitated patient discharge including contacting patient family and facility to confirm patient discharge plans.  Clinical information faxed to facility and family agreeable with plan.  CSW arranged ambulance transport via [facility] to [facility]  RN to call for report prior to discharge.    Final next level of care: [place] Barriers to Discharge: No Barriers Identified   Patient Goals and CMS Choice        Discharge Placement              Patient chooses bed at:  [facility]) Patient to be transferred to facility by: [facility]   Patient and family notified of of transfer: 03/22/20  Discharge Plan and Services                                     Social Determinants of Health (SDOH) Interventions     Readmission Risk Interventions No flowsheet data found.

## 2020-03-22 NOTE — Progress Notes (Signed)
Attempted to call report to [facility] [phone]. Voicemail left

## 2020-03-22 NOTE — Plan of Care (Signed)
  Problem: Education: Goal: Knowledge of General Education information will improve Description: Including pain rating scale, medication(s)/side effects and non-pharmacologic comfort measures Outcome: Adequate for Discharge   

## 2020-03-23 DIAGNOSIS — I480 Paroxysmal atrial fibrillation: Secondary | ICD-10-CM | POA: Diagnosis not present

## 2020-03-23 DIAGNOSIS — E1140 Type 2 diabetes mellitus with diabetic neuropathy, unspecified: Secondary | ICD-10-CM | POA: Diagnosis not present

## 2020-03-23 DIAGNOSIS — I5032 Chronic diastolic (congestive) heart failure: Secondary | ICD-10-CM | POA: Diagnosis not present

## 2020-03-24 ENCOUNTER — Ambulatory Visit: Payer: Medicare HMO | Admitting: Family

## 2020-03-27 NOTE — Progress Notes (Signed)
Patient ID: Alexa Lowe, female    DOB: Jan 30, 1931, 86 y.o.   MRN: [id number]  HPI  Alexa Lowe is a 86 y/o female with a history of DM, HTN, COPD, atrial fibrillation, previous tobacco use and chronic heart failure   Echo report from 03/16/20 reviewed and showed an EF of 50-55% along with mild/moderate MR/ TR.   Admitted 03/15/20 due to shortness of breath and new onset AF. Cardiology consult obtained. Lasix held and to be used PRN. Head CT completed due to slurring of speech which was negative. Magnesium level repleted. Discharged after 7 days.   She presents today for her initial visit with a chief complaint of moderate fatigue with little exertion. She says that this has been present for several months. Currently receiving PT BID at facility. She has associated dry cough, shortness of breath, rare chest pain, pedal edema, light-headedness and rhinorrhea along with this. She denies any difficulty sleeping, abdominal distention, palpitations or weight gain.   Currently at a facility for rehab and is not adding any salt to her food. Does eat 2 small sausage biscuits most mornings at home and a ham biscuit every Sunday.   Has scales at home so she can weigh herself after she gest home.   Past Medical History:  Diagnosis Date  . Arrhythmia    atrial fibrillation  . Cancer [facility])    Right breast  . CHF (congestive heart failure) ([place])   . COPD (chronic obstructive pulmonary disease) ([place])   . Diabetes mellitus without complication ([place])   . Hypertension    Past Surgical History:  Procedure Laterality Date  . ABDOMINAL SURGERY    . APPENDECTOMY    . BACK SURGERY    . BREAST SURGERY    . CHOLECYSTECTOMY    . ESOPHAGOGASTRODUODENOSCOPY N/A 08/31/2018   Procedure: ESOPHAGOGASTRODUODENOSCOPY (EGD);  Surgeon: Lin Landsman, MD;  Location: [facility] ENDOSCOPY;  Service: Gastroenterology;  Laterality: N/A;  . ESOPHAGOGASTRODUODENOSCOPY (EGD) WITH PROPOFOL N/A 05/20/2018   Procedure:  ESOPHAGOGASTRODUODENOSCOPY (EGD) WITH PROPOFOL;  Surgeon: Toledo, Benay Pike, MD;  Location: [facility] ENDOSCOPY;  Service: Gastroenterology;  Laterality: N/A;  . ESOPHAGOGASTRODUODENOSCOPY (EGD) WITH PROPOFOL N/A 12/17/2018   Procedure: ESOPHAGOGASTRODUODENOSCOPY (EGD) WITH PROPOFOL;  Surgeon: Lin Landsman, MD;  Location: [facility] ENDOSCOPY;  Service: Gastroenterology;  Laterality: N/A;   Family History  Problem Relation Age of Onset  . CAD Father    Social History   Tobacco Use  . Smoking status: Former Research scientist (life sciences)  . Smokeless tobacco: Never Used  Substance Use Topics  . Alcohol use: No   Allergies  Allergen Reactions  . Morphine And Related Other (See Comments)    Reaction:  Hallucinations   . Ace Inhibitors Other (See Comments)  . Doxycycline Other (See Comments)    Caused GI upset  . Erythromycin Ethylsuccinate Other (See Comments)  . Codeine Nausea And Vomiting   Prior to Admission medications   Medication Sig Start Date End Date Taking? Authorizing Provider  allopurinol (ZYLOPRIM) 300 MG tablet Take 300 mg by mouth daily.  10/29/18  Yes [provider]  apixaban (ELIQUIS) 2.5 MG TABS tablet Take 1 tablet (2.5 mg total) by mouth 2 (two) times daily. 03/21/20  Yes Nolberto Hanlon, MD  atorvastatin (LIPITOR) 40 MG tablet Take 1 tablet (40 mg total) by mouth at bedtime. 03/21/20  Yes Nolberto Hanlon, MD  citalopram (CELEXA) 20 MG tablet Take 20 mg by mouth daily.    Yes [provider]  gabapentin (NEURONTIN) 300 MG  capsule Take 300 mg by mouth 3 (three) times daily. 02/22/20  Yes [provider]  hydrOXYzine (ATARAX/VISTARIL) 25 MG tablet Take 25 mg by mouth every 6 (six) hours as needed for itching.   Yes [provider]  losartan (COZAAR) 50 MG tablet Take 50 mg by mouth daily.  11/28/18  Yes [provider]  metFORMIN (GLUCOPHAGE) 500 MG tablet Take 500 mg by mouth in the morning and at bedtime.  10/29/18  Yes [provider]  metoprolol  succinate (TOPROL-XL) 25 MG 24 hr tablet Take 0.5 tablets (12.5 mg total) by mouth daily. 03/22/20  Yes Nolberto Hanlon, MD  nystatin cream (MYCOSTATIN) Apply topically 2 (two) times daily. 03/21/20  Yes Nolberto Hanlon, MD  pantoprazole (PROTONIX) 40 MG tablet Take 40 mg by mouth daily.   Yes [provider]  primidone (MYSOLINE) 50 MG tablet Take 25 mg by mouth 2 (two) times daily.  02/22/20  Yes [provider]  rOPINIRole (REQUIP) 0.25 MG tablet Take 0.25 mg by mouth at bedtime. 02/22/20  Yes [provider]    Review of Systems  Constitutional: Positive for fatigue (tire easily). Negative for appetite change.  HENT: Positive for rhinorrhea. Negative for congestion and sore throat.   Eyes: Negative.   Respiratory: Positive for cough (dry cough) and shortness of breath.   Cardiovascular: Positive for chest pain (once recently) and leg swelling. Negative for palpitations.  Gastrointestinal: Negative for abdominal distention and abdominal pain.  Endocrine: Negative.   Genitourinary: Negative.   Musculoskeletal: Negative for back pain and neck pain.  Skin: Negative.   Allergic/Immunologic: Negative.   Neurological: Positive for light-headedness. Negative for dizziness.  Hematological: Negative for adenopathy. Does not bruise/bleed easily.  Psychiatric/Behavioral: Negative for dysphoric mood and sleep disturbance (sleeping on 1 pillow with HOB elevated). The patient is not nervous/anxious.     Vitals:   03/28/20 1043  BP: 127/64  Pulse: (!) 50  Resp: 18  SpO2: 99%  Weight: 215 lb 8 oz (97.8 kg)  Height: 5\' 2"  (1.575 m)   Wt Readings from Last 3 Encounters:  03/28/20 215 lb 8 oz (97.8 kg)  03/22/20 215 lb 4.8 oz (97.7 kg)  03/15/20 230 lb (104.3 kg)   Lab Results  Component Value Date   CREATININE 0.95 03/21/2020   CREATININE 1.07 (H) 03/18/2020   CREATININE 1.28 (H) 03/17/2020    Physical Exam Vitals and nursing note reviewed. Chaperone present:  daughter present.  Constitutional:      Appearance: Normal appearance.  HENT:     Head: Normocephalic and atraumatic.  Cardiovascular:     Rate and Rhythm: Regular rhythm. Bradycardia present.  Pulmonary:     Effort: Pulmonary effort is normal. No respiratory distress.     Breath sounds: No wheezing or rales.  Abdominal:     General: There is no distension.     Palpations: Abdomen is soft.  Musculoskeletal:        General: No tenderness.     Cervical back: Normal range of motion and neck supple.     Right lower leg: Edema (trace pitting) present.     Left lower leg: Edema (trace pitting) present.  Skin:    General: Skin is warm and dry.  Neurological:     General: No focal deficit present.     Mental Status: She is alert and oriented to person, place, and time.  Psychiatric:        Mood and Affect: Mood normal.  Behavior: Behavior normal.        Thought Content: Thought content normal.    Assessment & Plan:  1: Chronic heart failure with preserved ejection fraction- - NYHA class III - euvolemic today - not being weighed daily; order written for her to be weighed daily and to call for an overnight weight gain of > 2 pounds or a weekly weight gain of >5 pounds - not adding salt but does eat saltier foods at home due to convenience such as sausage biscuits most mornings and ham biscuit on Sundays (doesn't eat sausage those days); reviewed the importance of keeping daily sodium intake to between 2000-2400mg  sodium/ day and that she needs to be aware that anything convenient or pre-packaged will have a lot of sodium in it; written dietary information given to her about this.  - saw cardiology ([place]) 12/02/19 & returns later today - receiving PT BID at facility - order written for compression socks to be put on daily with removal at bedtime - BNP 03/17/20 was 191.7  2: HTN- - BP looks good today - saw PCP ([place]) 12/20/19; currently seeing PCP at facility - [facility]  03/21/20 reviewed and showed sodium 136, potassium 4.6, creatinine 0.95 and GFR 58  3: DM- - A1c 03/16/20 was 6.1% - nofasting glucose here today was 91  4: Atrial fibrillation-  - currently rate controlled on metoprolol succinate; continued on apixaban - she does not notice palpitations   Facility medication list reviewed.   Return in 6 weeks or sooner for any questions/problems before then.

## 2020-03-28 ENCOUNTER — Encounter: Payer: Self-pay | Admitting: Family

## 2020-03-28 ENCOUNTER — Other Ambulatory Visit: Payer: Self-pay

## 2020-03-28 ENCOUNTER — Ambulatory Visit: Payer: Medicare HMO | Attending: Family | Admitting: Family

## 2020-03-28 VITALS — BP 127/64 | HR 50 | Resp 18 | Ht 62.0 in | Wt 215.5 lb

## 2020-03-28 DIAGNOSIS — Z79899 Other long term (current) drug therapy: Secondary | ICD-10-CM | POA: Insufficient documentation

## 2020-03-28 DIAGNOSIS — Z9889 Other specified postprocedural states: Secondary | ICD-10-CM | POA: Diagnosis not present

## 2020-03-28 DIAGNOSIS — N182 Chronic kidney disease, stage 2 (mild): Secondary | ICD-10-CM | POA: Diagnosis not present

## 2020-03-28 DIAGNOSIS — R059 Cough, unspecified: Secondary | ICD-10-CM | POA: Diagnosis not present

## 2020-03-28 DIAGNOSIS — Z87891 Personal history of nicotine dependence: Secondary | ICD-10-CM | POA: Insufficient documentation

## 2020-03-28 DIAGNOSIS — Z8673 Personal history of transient ischemic attack (TIA), and cerebral infarction without residual deficits: Secondary | ICD-10-CM | POA: Diagnosis not present

## 2020-03-28 DIAGNOSIS — Z7984 Long term (current) use of oral hypoglycemic drugs: Secondary | ICD-10-CM | POA: Insufficient documentation

## 2020-03-28 DIAGNOSIS — G459 Transient cerebral ischemic attack, unspecified: Secondary | ICD-10-CM | POA: Diagnosis not present

## 2020-03-28 DIAGNOSIS — R42 Dizziness and giddiness: Secondary | ICD-10-CM | POA: Diagnosis not present

## 2020-03-28 DIAGNOSIS — I5032 Chronic diastolic (congestive) heart failure: Secondary | ICD-10-CM | POA: Insufficient documentation

## 2020-03-28 DIAGNOSIS — E7800 Pure hypercholesterolemia, unspecified: Secondary | ICD-10-CM | POA: Diagnosis not present

## 2020-03-28 DIAGNOSIS — I10 Essential (primary) hypertension: Secondary | ICD-10-CM

## 2020-03-28 DIAGNOSIS — I480 Paroxysmal atrial fibrillation: Secondary | ICD-10-CM | POA: Diagnosis not present

## 2020-03-28 DIAGNOSIS — R0602 Shortness of breath: Secondary | ICD-10-CM | POA: Diagnosis not present

## 2020-03-28 DIAGNOSIS — R079 Chest pain, unspecified: Secondary | ICD-10-CM | POA: Insufficient documentation

## 2020-03-28 DIAGNOSIS — Z8249 Family history of ischemic heart disease and other diseases of the circulatory system: Secondary | ICD-10-CM | POA: Diagnosis not present

## 2020-03-28 DIAGNOSIS — E1122 Type 2 diabetes mellitus with diabetic chronic kidney disease: Secondary | ICD-10-CM | POA: Insufficient documentation

## 2020-03-28 DIAGNOSIS — J449 Chronic obstructive pulmonary disease, unspecified: Secondary | ICD-10-CM | POA: Insufficient documentation

## 2020-03-28 DIAGNOSIS — I110 Hypertensive heart disease with heart failure: Secondary | ICD-10-CM | POA: Insufficient documentation

## 2020-03-28 DIAGNOSIS — I4891 Unspecified atrial fibrillation: Secondary | ICD-10-CM | POA: Insufficient documentation

## 2020-03-28 DIAGNOSIS — E785 Hyperlipidemia, unspecified: Secondary | ICD-10-CM | POA: Diagnosis not present

## 2020-03-28 DIAGNOSIS — Z7901 Long term (current) use of anticoagulants: Secondary | ICD-10-CM | POA: Diagnosis not present

## 2020-03-28 DIAGNOSIS — E1169 Type 2 diabetes mellitus with other specified complication: Secondary | ICD-10-CM | POA: Diagnosis not present

## 2020-03-28 LAB — GLUCOSE, CAPILLARY: Glucose-Capillary: 91 mg/dL (ref 70–99)

## 2020-03-28 NOTE — Patient Instructions (Addendum)
Begin weighing daily and call for an overnight weight gain of > 2 pounds or a weekly weight gain of >5 pounds. 

## 2020-03-30 DIAGNOSIS — I5032 Chronic diastolic (congestive) heart failure: Secondary | ICD-10-CM | POA: Diagnosis not present

## 2020-03-30 DIAGNOSIS — I480 Paroxysmal atrial fibrillation: Secondary | ICD-10-CM | POA: Diagnosis not present

## 2020-03-30 DIAGNOSIS — R079 Chest pain, unspecified: Secondary | ICD-10-CM | POA: Diagnosis not present

## 2020-04-04 DIAGNOSIS — K219 Gastro-esophageal reflux disease without esophagitis: Secondary | ICD-10-CM | POA: Diagnosis not present

## 2020-04-11 DIAGNOSIS — E785 Hyperlipidemia, unspecified: Secondary | ICD-10-CM | POA: Diagnosis not present

## 2020-04-11 DIAGNOSIS — E119 Type 2 diabetes mellitus without complications: Secondary | ICD-10-CM | POA: Diagnosis not present

## 2020-04-11 DIAGNOSIS — I4891 Unspecified atrial fibrillation: Secondary | ICD-10-CM | POA: Diagnosis not present

## 2020-04-12 DIAGNOSIS — R0789 Other chest pain: Secondary | ICD-10-CM | POA: Diagnosis not present

## 2020-04-12 DIAGNOSIS — E1149 Type 2 diabetes mellitus with other diabetic neurological complication: Secondary | ICD-10-CM | POA: Diagnosis not present

## 2020-04-12 DIAGNOSIS — I5032 Chronic diastolic (congestive) heart failure: Secondary | ICD-10-CM | POA: Diagnosis not present

## 2020-04-12 DIAGNOSIS — K219 Gastro-esophageal reflux disease without esophagitis: Secondary | ICD-10-CM | POA: Diagnosis not present

## 2020-04-12 DIAGNOSIS — I480 Paroxysmal atrial fibrillation: Secondary | ICD-10-CM | POA: Diagnosis not present

## 2020-04-14 ENCOUNTER — Encounter: Payer: Self-pay | Admitting: Family

## 2020-04-16 DIAGNOSIS — F419 Anxiety disorder, unspecified: Secondary | ICD-10-CM | POA: Diagnosis not present

## 2020-04-16 DIAGNOSIS — E1159 Type 2 diabetes mellitus with other circulatory complications: Secondary | ICD-10-CM | POA: Diagnosis not present

## 2020-04-16 DIAGNOSIS — E1169 Type 2 diabetes mellitus with other specified complication: Secondary | ICD-10-CM | POA: Diagnosis not present

## 2020-04-16 DIAGNOSIS — E1140 Type 2 diabetes mellitus with diabetic neuropathy, unspecified: Secondary | ICD-10-CM | POA: Diagnosis not present

## 2020-04-16 DIAGNOSIS — I5032 Chronic diastolic (congestive) heart failure: Secondary | ICD-10-CM | POA: Diagnosis not present

## 2020-04-16 DIAGNOSIS — I152 Hypertension secondary to endocrine disorders: Secondary | ICD-10-CM | POA: Diagnosis not present

## 2020-04-16 DIAGNOSIS — I480 Paroxysmal atrial fibrillation: Secondary | ICD-10-CM | POA: Diagnosis not present

## 2020-04-16 DIAGNOSIS — J449 Chronic obstructive pulmonary disease, unspecified: Secondary | ICD-10-CM | POA: Diagnosis not present

## 2020-04-16 DIAGNOSIS — F32A Depression, unspecified: Secondary | ICD-10-CM | POA: Diagnosis not present

## 2020-04-17 ENCOUNTER — Other Ambulatory Visit: Payer: Self-pay

## 2020-04-17 DIAGNOSIS — E1159 Type 2 diabetes mellitus with other circulatory complications: Secondary | ICD-10-CM | POA: Diagnosis not present

## 2020-04-17 DIAGNOSIS — F419 Anxiety disorder, unspecified: Secondary | ICD-10-CM | POA: Diagnosis not present

## 2020-04-17 DIAGNOSIS — E1169 Type 2 diabetes mellitus with other specified complication: Secondary | ICD-10-CM | POA: Diagnosis not present

## 2020-04-17 DIAGNOSIS — F32A Depression, unspecified: Secondary | ICD-10-CM | POA: Diagnosis not present

## 2020-04-17 DIAGNOSIS — I480 Paroxysmal atrial fibrillation: Secondary | ICD-10-CM | POA: Diagnosis not present

## 2020-04-17 DIAGNOSIS — E1140 Type 2 diabetes mellitus with diabetic neuropathy, unspecified: Secondary | ICD-10-CM | POA: Diagnosis not present

## 2020-04-17 DIAGNOSIS — J449 Chronic obstructive pulmonary disease, unspecified: Secondary | ICD-10-CM | POA: Diagnosis not present

## 2020-04-17 DIAGNOSIS — I152 Hypertension secondary to endocrine disorders: Secondary | ICD-10-CM | POA: Diagnosis not present

## 2020-04-17 DIAGNOSIS — I5032 Chronic diastolic (congestive) heart failure: Secondary | ICD-10-CM | POA: Diagnosis not present

## 2020-04-17 NOTE — Patient Outreach (Signed)
[place] [facility]) Care Management  04/17/2020  KANESHA CADLE Mar 28, 1931 [id number]   Referral Date: 04/17/20 Referral Source: [employer] Report Date of Discharge:04/13/20 Facility: [place]: [facility]  Outreach attempt: No answer.  HIPAA compliant voice message left.     Plan: RN CM will attempt again within 4 business days and send letter.   [name], RN, MSN [facility] Care Management Care Management Coordinator Direct Line [phone] Toll Free: [phone]  Fax: [phone]

## 2020-04-18 ENCOUNTER — Other Ambulatory Visit: Payer: Self-pay

## 2020-04-18 NOTE — Patient Outreach (Signed)
[place] [facility]) Care Management  04/18/2020  Alexa Lowe Feb 22, 1931 [id number]   Referral Date: 04/17/20 Referral Source: [employer] Report Date of Discharge:04/13/20 Facility: [place]: [facility]  Outreach attempt: No answer.  HIPAA compliant voice message left.     Plan: RN CM will attempt again within 4 business days.  [name], RN, MSN [place] Management Care Management Coordinator Direct Line [phone] Cell [phone] Toll Free: [phone]  Fax: [phone]

## 2020-04-19 ENCOUNTER — Other Ambulatory Visit: Payer: Self-pay

## 2020-04-19 DIAGNOSIS — E1159 Type 2 diabetes mellitus with other circulatory complications: Secondary | ICD-10-CM | POA: Diagnosis not present

## 2020-04-19 DIAGNOSIS — I152 Hypertension secondary to endocrine disorders: Secondary | ICD-10-CM | POA: Diagnosis not present

## 2020-04-19 DIAGNOSIS — I5032 Chronic diastolic (congestive) heart failure: Secondary | ICD-10-CM | POA: Diagnosis not present

## 2020-04-19 DIAGNOSIS — F329 Major depressive disorder, single episode, unspecified: Secondary | ICD-10-CM | POA: Diagnosis not present

## 2020-04-19 DIAGNOSIS — J449 Chronic obstructive pulmonary disease, unspecified: Secondary | ICD-10-CM | POA: Diagnosis not present

## 2020-04-19 DIAGNOSIS — I480 Paroxysmal atrial fibrillation: Secondary | ICD-10-CM | POA: Diagnosis not present

## 2020-04-19 DIAGNOSIS — E1140 Type 2 diabetes mellitus with diabetic neuropathy, unspecified: Secondary | ICD-10-CM | POA: Diagnosis not present

## 2020-04-19 NOTE — Patient Outreach (Signed)
[place] [facility]) Care Management  04/19/2020  Alexa Lowe 05-14-1930 [id number]   Referral Date:04/17/20 Referral Source:[employer] Report Date of Discharge:04/13/20 Facility:[facility] [place] attempt:No answer. HIPAA compliant voice message left.    Plan: RN CM willattempt again in the month of January.    [name], RN, MSN [place] Management Care Management Coordinator Direct Line [phone] Cell [phone] Toll Free: [phone]  Fax: [phone]

## 2020-04-20 ENCOUNTER — Ambulatory Visit: Payer: Self-pay

## 2020-04-20 DIAGNOSIS — E1159 Type 2 diabetes mellitus with other circulatory complications: Secondary | ICD-10-CM | POA: Diagnosis not present

## 2020-04-20 DIAGNOSIS — I480 Paroxysmal atrial fibrillation: Secondary | ICD-10-CM | POA: Diagnosis not present

## 2020-04-20 DIAGNOSIS — E1169 Type 2 diabetes mellitus with other specified complication: Secondary | ICD-10-CM | POA: Diagnosis not present

## 2020-04-20 DIAGNOSIS — I5032 Chronic diastolic (congestive) heart failure: Secondary | ICD-10-CM | POA: Diagnosis not present

## 2020-04-20 DIAGNOSIS — I152 Hypertension secondary to endocrine disorders: Secondary | ICD-10-CM | POA: Diagnosis not present

## 2020-04-20 DIAGNOSIS — F32A Depression, unspecified: Secondary | ICD-10-CM | POA: Diagnosis not present

## 2020-04-20 DIAGNOSIS — E1140 Type 2 diabetes mellitus with diabetic neuropathy, unspecified: Secondary | ICD-10-CM | POA: Diagnosis not present

## 2020-04-20 DIAGNOSIS — J449 Chronic obstructive pulmonary disease, unspecified: Secondary | ICD-10-CM | POA: Diagnosis not present

## 2020-04-20 DIAGNOSIS — F419 Anxiety disorder, unspecified: Secondary | ICD-10-CM | POA: Diagnosis not present

## 2020-04-20 DIAGNOSIS — Z9889 Other specified postprocedural states: Secondary | ICD-10-CM | POA: Diagnosis not present

## 2020-04-20 DIAGNOSIS — G459 Transient cerebral ischemic attack, unspecified: Secondary | ICD-10-CM | POA: Diagnosis not present

## 2020-04-20 DIAGNOSIS — E7800 Pure hypercholesterolemia, unspecified: Secondary | ICD-10-CM | POA: Diagnosis not present

## 2020-04-20 DIAGNOSIS — I10 Essential (primary) hypertension: Secondary | ICD-10-CM | POA: Diagnosis not present

## 2020-04-20 DIAGNOSIS — Z8673 Personal history of transient ischemic attack (TIA), and cerebral infarction without residual deficits: Secondary | ICD-10-CM | POA: Diagnosis not present

## 2020-04-21 DIAGNOSIS — I152 Hypertension secondary to endocrine disorders: Secondary | ICD-10-CM | POA: Diagnosis not present

## 2020-04-21 DIAGNOSIS — E1159 Type 2 diabetes mellitus with other circulatory complications: Secondary | ICD-10-CM | POA: Diagnosis not present

## 2020-04-21 DIAGNOSIS — E1140 Type 2 diabetes mellitus with diabetic neuropathy, unspecified: Secondary | ICD-10-CM | POA: Diagnosis not present

## 2020-04-21 DIAGNOSIS — I480 Paroxysmal atrial fibrillation: Secondary | ICD-10-CM | POA: Diagnosis not present

## 2020-04-21 DIAGNOSIS — E1169 Type 2 diabetes mellitus with other specified complication: Secondary | ICD-10-CM | POA: Diagnosis not present

## 2020-04-21 DIAGNOSIS — J449 Chronic obstructive pulmonary disease, unspecified: Secondary | ICD-10-CM | POA: Diagnosis not present

## 2020-04-21 DIAGNOSIS — F32A Depression, unspecified: Secondary | ICD-10-CM | POA: Diagnosis not present

## 2020-04-21 DIAGNOSIS — F419 Anxiety disorder, unspecified: Secondary | ICD-10-CM | POA: Diagnosis not present

## 2020-04-21 DIAGNOSIS — I5032 Chronic diastolic (congestive) heart failure: Secondary | ICD-10-CM | POA: Diagnosis not present

## 2020-04-25 DIAGNOSIS — I480 Paroxysmal atrial fibrillation: Secondary | ICD-10-CM | POA: Diagnosis not present

## 2020-04-25 DIAGNOSIS — F32A Depression, unspecified: Secondary | ICD-10-CM | POA: Diagnosis not present

## 2020-04-25 DIAGNOSIS — F419 Anxiety disorder, unspecified: Secondary | ICD-10-CM | POA: Diagnosis not present

## 2020-04-25 DIAGNOSIS — I152 Hypertension secondary to endocrine disorders: Secondary | ICD-10-CM | POA: Diagnosis not present

## 2020-04-25 DIAGNOSIS — I5032 Chronic diastolic (congestive) heart failure: Secondary | ICD-10-CM | POA: Diagnosis not present

## 2020-04-25 DIAGNOSIS — E1159 Type 2 diabetes mellitus with other circulatory complications: Secondary | ICD-10-CM | POA: Diagnosis not present

## 2020-04-25 DIAGNOSIS — J449 Chronic obstructive pulmonary disease, unspecified: Secondary | ICD-10-CM | POA: Diagnosis not present

## 2020-04-25 DIAGNOSIS — E1140 Type 2 diabetes mellitus with diabetic neuropathy, unspecified: Secondary | ICD-10-CM | POA: Diagnosis not present

## 2020-04-25 DIAGNOSIS — E1169 Type 2 diabetes mellitus with other specified complication: Secondary | ICD-10-CM | POA: Diagnosis not present

## 2020-04-26 DIAGNOSIS — F419 Anxiety disorder, unspecified: Secondary | ICD-10-CM | POA: Diagnosis not present

## 2020-04-26 DIAGNOSIS — J449 Chronic obstructive pulmonary disease, unspecified: Secondary | ICD-10-CM | POA: Diagnosis not present

## 2020-04-26 DIAGNOSIS — E1169 Type 2 diabetes mellitus with other specified complication: Secondary | ICD-10-CM | POA: Diagnosis not present

## 2020-04-26 DIAGNOSIS — E1159 Type 2 diabetes mellitus with other circulatory complications: Secondary | ICD-10-CM | POA: Diagnosis not present

## 2020-04-26 DIAGNOSIS — F32A Depression, unspecified: Secondary | ICD-10-CM | POA: Diagnosis not present

## 2020-04-26 DIAGNOSIS — I5032 Chronic diastolic (congestive) heart failure: Secondary | ICD-10-CM | POA: Diagnosis not present

## 2020-04-26 DIAGNOSIS — I152 Hypertension secondary to endocrine disorders: Secondary | ICD-10-CM | POA: Diagnosis not present

## 2020-04-26 DIAGNOSIS — E1140 Type 2 diabetes mellitus with diabetic neuropathy, unspecified: Secondary | ICD-10-CM | POA: Diagnosis not present

## 2020-04-26 DIAGNOSIS — I480 Paroxysmal atrial fibrillation: Secondary | ICD-10-CM | POA: Diagnosis not present

## 2020-04-28 DIAGNOSIS — E1159 Type 2 diabetes mellitus with other circulatory complications: Secondary | ICD-10-CM | POA: Diagnosis not present

## 2020-04-28 DIAGNOSIS — I152 Hypertension secondary to endocrine disorders: Secondary | ICD-10-CM | POA: Diagnosis not present

## 2020-04-28 DIAGNOSIS — I480 Paroxysmal atrial fibrillation: Secondary | ICD-10-CM | POA: Diagnosis not present

## 2020-04-28 DIAGNOSIS — E1140 Type 2 diabetes mellitus with diabetic neuropathy, unspecified: Secondary | ICD-10-CM | POA: Diagnosis not present

## 2020-04-28 DIAGNOSIS — J449 Chronic obstructive pulmonary disease, unspecified: Secondary | ICD-10-CM | POA: Diagnosis not present

## 2020-04-28 DIAGNOSIS — E1169 Type 2 diabetes mellitus with other specified complication: Secondary | ICD-10-CM | POA: Diagnosis not present

## 2020-04-28 DIAGNOSIS — I5032 Chronic diastolic (congestive) heart failure: Secondary | ICD-10-CM | POA: Diagnosis not present

## 2020-04-28 DIAGNOSIS — F419 Anxiety disorder, unspecified: Secondary | ICD-10-CM | POA: Diagnosis not present

## 2020-04-28 DIAGNOSIS — F32A Depression, unspecified: Secondary | ICD-10-CM | POA: Diagnosis not present

## 2020-05-01 DIAGNOSIS — E1159 Type 2 diabetes mellitus with other circulatory complications: Secondary | ICD-10-CM | POA: Diagnosis not present

## 2020-05-01 DIAGNOSIS — I5032 Chronic diastolic (congestive) heart failure: Secondary | ICD-10-CM | POA: Diagnosis not present

## 2020-05-01 DIAGNOSIS — E1169 Type 2 diabetes mellitus with other specified complication: Secondary | ICD-10-CM | POA: Diagnosis not present

## 2020-05-01 DIAGNOSIS — F419 Anxiety disorder, unspecified: Secondary | ICD-10-CM | POA: Diagnosis not present

## 2020-05-01 DIAGNOSIS — I480 Paroxysmal atrial fibrillation: Secondary | ICD-10-CM | POA: Diagnosis not present

## 2020-05-01 DIAGNOSIS — J449 Chronic obstructive pulmonary disease, unspecified: Secondary | ICD-10-CM | POA: Diagnosis not present

## 2020-05-01 DIAGNOSIS — E1140 Type 2 diabetes mellitus with diabetic neuropathy, unspecified: Secondary | ICD-10-CM | POA: Diagnosis not present

## 2020-05-01 DIAGNOSIS — F32A Depression, unspecified: Secondary | ICD-10-CM | POA: Diagnosis not present

## 2020-05-01 DIAGNOSIS — I152 Hypertension secondary to endocrine disorders: Secondary | ICD-10-CM | POA: Diagnosis not present

## 2020-05-04 DIAGNOSIS — I152 Hypertension secondary to endocrine disorders: Secondary | ICD-10-CM | POA: Diagnosis not present

## 2020-05-04 DIAGNOSIS — F419 Anxiety disorder, unspecified: Secondary | ICD-10-CM | POA: Diagnosis not present

## 2020-05-04 DIAGNOSIS — I5032 Chronic diastolic (congestive) heart failure: Secondary | ICD-10-CM | POA: Diagnosis not present

## 2020-05-04 DIAGNOSIS — E1169 Type 2 diabetes mellitus with other specified complication: Secondary | ICD-10-CM | POA: Diagnosis not present

## 2020-05-04 DIAGNOSIS — E1159 Type 2 diabetes mellitus with other circulatory complications: Secondary | ICD-10-CM | POA: Diagnosis not present

## 2020-05-04 DIAGNOSIS — J449 Chronic obstructive pulmonary disease, unspecified: Secondary | ICD-10-CM | POA: Diagnosis not present

## 2020-05-04 DIAGNOSIS — I480 Paroxysmal atrial fibrillation: Secondary | ICD-10-CM | POA: Diagnosis not present

## 2020-05-04 DIAGNOSIS — F32A Depression, unspecified: Secondary | ICD-10-CM | POA: Diagnosis not present

## 2020-05-04 DIAGNOSIS — E1140 Type 2 diabetes mellitus with diabetic neuropathy, unspecified: Secondary | ICD-10-CM | POA: Diagnosis not present

## 2020-05-05 DIAGNOSIS — I480 Paroxysmal atrial fibrillation: Secondary | ICD-10-CM | POA: Diagnosis not present

## 2020-05-05 DIAGNOSIS — J449 Chronic obstructive pulmonary disease, unspecified: Secondary | ICD-10-CM | POA: Diagnosis not present

## 2020-05-05 DIAGNOSIS — I5032 Chronic diastolic (congestive) heart failure: Secondary | ICD-10-CM | POA: Diagnosis not present

## 2020-05-05 DIAGNOSIS — F32A Depression, unspecified: Secondary | ICD-10-CM | POA: Diagnosis not present

## 2020-05-05 DIAGNOSIS — E1169 Type 2 diabetes mellitus with other specified complication: Secondary | ICD-10-CM | POA: Diagnosis not present

## 2020-05-05 DIAGNOSIS — E1159 Type 2 diabetes mellitus with other circulatory complications: Secondary | ICD-10-CM | POA: Diagnosis not present

## 2020-05-05 DIAGNOSIS — I152 Hypertension secondary to endocrine disorders: Secondary | ICD-10-CM | POA: Diagnosis not present

## 2020-05-05 DIAGNOSIS — F419 Anxiety disorder, unspecified: Secondary | ICD-10-CM | POA: Diagnosis not present

## 2020-05-05 DIAGNOSIS — E1140 Type 2 diabetes mellitus with diabetic neuropathy, unspecified: Secondary | ICD-10-CM | POA: Diagnosis not present

## 2020-05-08 ENCOUNTER — Other Ambulatory Visit: Payer: Self-pay

## 2020-05-08 NOTE — Patient Outreach (Signed)
[place] [facility]) Care Management  05/08/2020  Alexa Lowe Jun 08, 1930 [id number]   Referral Date:04/17/20 Referral Source:[employer] Report Date of Discharge:04/13/20 Facility:[facility] [place] [facility] [place] attempt:No answer. HIPAA compliant voice message left.    Plan: RN CM willclose case.  [name], RN, MSN [place] Management Care Management Coordinator Direct Line [phone] Cell [phone] Toll Free: [phone]  Fax: [phone]

## 2020-05-09 ENCOUNTER — Ambulatory Visit: Payer: Medicare HMO | Admitting: Family

## 2020-05-23 DIAGNOSIS — J449 Chronic obstructive pulmonary disease, unspecified: Secondary | ICD-10-CM | POA: Diagnosis not present

## 2020-05-23 DIAGNOSIS — E1140 Type 2 diabetes mellitus with diabetic neuropathy, unspecified: Secondary | ICD-10-CM | POA: Diagnosis not present

## 2020-05-23 DIAGNOSIS — F419 Anxiety disorder, unspecified: Secondary | ICD-10-CM | POA: Diagnosis not present

## 2020-05-23 DIAGNOSIS — F32A Depression, unspecified: Secondary | ICD-10-CM | POA: Diagnosis not present

## 2020-05-23 DIAGNOSIS — I152 Hypertension secondary to endocrine disorders: Secondary | ICD-10-CM | POA: Diagnosis not present

## 2020-05-23 DIAGNOSIS — I5032 Chronic diastolic (congestive) heart failure: Secondary | ICD-10-CM | POA: Diagnosis not present

## 2020-05-23 DIAGNOSIS — E1159 Type 2 diabetes mellitus with other circulatory complications: Secondary | ICD-10-CM | POA: Diagnosis not present

## 2020-05-23 DIAGNOSIS — E1169 Type 2 diabetes mellitus with other specified complication: Secondary | ICD-10-CM | POA: Diagnosis not present

## 2020-05-23 DIAGNOSIS — I480 Paroxysmal atrial fibrillation: Secondary | ICD-10-CM | POA: Diagnosis not present

## 2020-05-30 DIAGNOSIS — E785 Hyperlipidemia, unspecified: Secondary | ICD-10-CM | POA: Diagnosis not present

## 2020-05-30 DIAGNOSIS — I480 Paroxysmal atrial fibrillation: Secondary | ICD-10-CM | POA: Diagnosis not present

## 2020-05-30 DIAGNOSIS — E1169 Type 2 diabetes mellitus with other specified complication: Secondary | ICD-10-CM | POA: Diagnosis not present

## 2020-05-30 DIAGNOSIS — I10 Essential (primary) hypertension: Secondary | ICD-10-CM | POA: Diagnosis not present

## 2020-05-30 DIAGNOSIS — I5032 Chronic diastolic (congestive) heart failure: Secondary | ICD-10-CM | POA: Diagnosis not present

## 2020-05-30 DIAGNOSIS — R001 Bradycardia, unspecified: Secondary | ICD-10-CM | POA: Diagnosis not present

## 2020-05-30 DIAGNOSIS — R609 Edema, unspecified: Secondary | ICD-10-CM | POA: Diagnosis not present

## 2020-06-05 NOTE — Progress Notes (Signed)
Patient ID: Alexa Lowe, female    DOB: 1930-05-09, 87 y.o.   MRN: [id number]  HPI  Alexa Lowe is a 87 y/o female with a history of DM, HTN, COPD, atrial fibrillation, previous tobacco use and chronic heart failure   Echo report from 03/16/20 reviewed and showed an EF of 50-55% along with mild/moderate MR/ TR.   Admitted 03/15/20 due to shortness of breath and new onset AF. Cardiology consult obtained. Lasix held and to be used PRN. Head CT completed due to slurring of speech which was negative. Magnesium level repleted. Discharged after 7 days.   Alexa Lowe presents today for a follow-up visit with a chief complaint of moderate fatigue upon minimal exertion. Alexa Lowe describes this as chronic in nature having been present for several years although Alexa Lowe reports that it has worsened over the last several weeks. In fact, Alexa Lowe can get dressed and then feel like Alexa Lowe's worked an 8 hour day. Alexa Lowe has associated shortness of breath, rare chest pain, pedal edema, occasional palpitations, nausea, dizziness and generalized weakness along with this. Alexa Lowe denies any difficulty sleeping or weight gain.   Recently wore a holter monitor and returns to cardiology tomorrow for the results. Metoprolol has been stopped due to bradycardia (HR in the 30's-40's)    Becomes tearful when talking about how bad Alexa Lowe feels and that Alexa Lowe's made the decision to go into an assisted living facility because Alexa Lowe realizes that Alexa Lowe needs more help and Alexa Lowe doesn't want to be a burden to her family.   Past Medical History:  Diagnosis Date  . Arrhythmia    atrial fibrillation  . Cancer [facility])    Right breast  . CHF (congestive heart failure) ([place])   . COPD (chronic obstructive pulmonary disease) ([place])   . Diabetes mellitus without complication ([place])   . Hypertension    Past Surgical History:  Procedure Laterality Date  . ABDOMINAL SURGERY    . APPENDECTOMY    . BACK SURGERY    . BREAST SURGERY    . CHOLECYSTECTOMY    .  ESOPHAGOGASTRODUODENOSCOPY N/A 08/31/2018   Procedure: ESOPHAGOGASTRODUODENOSCOPY (EGD);  Surgeon: Lin Landsman, MD;  Location: [facility] ENDOSCOPY;  Service: Gastroenterology;  Laterality: N/A;  . ESOPHAGOGASTRODUODENOSCOPY (EGD) WITH PROPOFOL N/A 05/20/2018   Procedure: ESOPHAGOGASTRODUODENOSCOPY (EGD) WITH PROPOFOL;  Surgeon: Toledo, Benay Pike, MD;  Location: [facility] ENDOSCOPY;  Service: Gastroenterology;  Laterality: N/A;  . ESOPHAGOGASTRODUODENOSCOPY (EGD) WITH PROPOFOL N/A 12/17/2018   Procedure: ESOPHAGOGASTRODUODENOSCOPY (EGD) WITH PROPOFOL;  Surgeon: Lin Landsman, MD;  Location: [facility] ENDOSCOPY;  Service: Gastroenterology;  Laterality: N/A;   Family History  Problem Relation Age of Onset  . CAD Father    Social History   Tobacco Use  . Smoking status: Former Research scientist (life sciences)  . Smokeless tobacco: Never Used  Substance Use Topics  . Alcohol use: No   Allergies  Allergen Reactions  . Morphine And Related Other (See Comments)    Reaction:  Hallucinations   . Ace Inhibitors Other (See Comments)  . Doxycycline Other (See Comments)    Caused GI upset  . Erythromycin Ethylsuccinate Other (See Comments)  . Codeine Nausea And Vomiting   Prior to Admission medications   Medication Sig Start Date End Date Taking? Authorizing Provider  allopurinol (ZYLOPRIM) 300 MG tablet Take 300 mg by mouth daily.  10/29/18  Yes [provider]  apixaban (ELIQUIS) 2.5 MG TABS tablet Take 1 tablet (2.5 mg total) by mouth 2 (two) times daily. 03/21/20  Yes Nolberto Hanlon, MD  atorvastatin (LIPITOR) 40 MG tablet Take 1 tablet (40 mg total) by mouth at bedtime. 03/21/20  Yes Nolberto Hanlon, MD  citalopram (CELEXA) 20 MG tablet Take 20 mg by mouth daily.    Yes [provider]  gabapentin (NEURONTIN) 300 MG capsule Take 300 mg by mouth 3 (three) times daily. 02/22/20  Yes [provider]  hydrOXYzine (ATARAX/VISTARIL) 25 MG tablet Take 25 mg by mouth every 6 (six) hours as needed for  itching.   Yes [provider]  losartan (COZAAR) 50 MG tablet Take 50 mg by mouth daily.  11/28/18  Yes [provider]  metFORMIN (GLUCOPHAGE) 500 MG tablet Take 500 mg by mouth in the morning and at bedtime.  10/29/18  Yes [provider]  nystatin cream (MYCOSTATIN) Apply topically 2 (two) times daily. 03/21/20  Yes Nolberto Hanlon, MD  pantoprazole (PROTONIX) 40 MG tablet Take 40 mg by mouth daily.   Yes [provider]  primidone (MYSOLINE) 50 MG tablet Take 25 mg by mouth 2 (two) times daily.  02/22/20  Yes [provider]  rOPINIRole (REQUIP) 0.25 MG tablet Take 0.25 mg by mouth at bedtime. 02/22/20  Yes [provider]    Review of Systems  Constitutional: Positive for fatigue (tire easily). Negative for appetite change.  HENT: Positive for rhinorrhea. Negative for congestion and sore throat.   Eyes: Negative.   Respiratory: Positive for cough (dry cough) and shortness of breath.   Cardiovascular: Positive for chest pain (occasionally), palpitations (at times) and leg swelling.  Gastrointestinal: Positive for nausea. Negative for abdominal distention and abdominal pain.  Endocrine: Negative.   Genitourinary: Negative.   Musculoskeletal: Positive for arthralgias (knee pains). Negative for back pain and neck pain.  Skin: Negative.   Allergic/Immunologic: Negative.   Neurological: Positive for dizziness (in the mornings) and weakness. Negative for light-headedness.  Hematological: Negative for adenopathy. Does not bruise/bleed easily.  Psychiatric/Behavioral: Negative for dysphoric mood and sleep disturbance (sleeping on 1 pillow with HOB elevated). The patient is not nervous/anxious.    Vitals:   06/06/20 1335  BP: 125/64  Pulse: 88  Resp: 16  SpO2: 96%  Weight: 216 lb (98 kg)  Height: 5\' 2"  (1.575 m)   Wt Readings from Last 3 Encounters:  06/06/20 216 lb (98 kg)  03/28/20 215 lb 8 oz (97.8 kg)  03/22/20 215 lb 4.8 oz (97.7  kg)   Lab Results  Component Value Date   CREATININE 0.95 03/21/2020   CREATININE 1.07 (H) 03/18/2020   CREATININE 1.28 (H) 03/17/2020    Physical Exam Vitals and nursing note reviewed. Chaperone present: daughter present.  Constitutional:      Appearance: Normal appearance.  HENT:     Head: Normocephalic and atraumatic.  Cardiovascular:     Rate and Rhythm: Normal rate and regular rhythm.  Pulmonary:     Effort: Pulmonary effort is normal. No respiratory distress.     Breath sounds: No wheezing or rales.  Abdominal:     General: There is no distension.     Palpations: Abdomen is soft.  Musculoskeletal:        General: No tenderness.     Cervical back: Normal range of motion and neck supple.     Right lower leg: Edema (1 + pitting) present.     Left lower leg: Edema (1+ pitting) present.  Skin:    General: Skin is warm and dry.  Neurological:     General: No focal deficit present.  Mental Status: Alexa Lowe is alert and oriented to person, place, and time.  Psychiatric:        Mood and Affect: Mood normal.        Behavior: Behavior normal.        Thought Content: Thought content normal.    Assessment & Plan:  1: Chronic heart failure with preserved ejection fraction- - NYHA class III - euvolemic today - weighing daily; reminded to call for an overnight weight gain of > 2 pounds or a weekly weight gain of >5 pounds - weight stable from last visit here 2 months ago - not adding salt but does eat saltier foods at home due to convenience such as fried hotdogs this morning for breakfast - saw cardiology Margarito Courser) 05/30/20 - BNP 03/17/20 was 191.7 - reports receiving flu vaccine for this season - reports receiving all 3 covid vaccines  2: HTN- - BP looks good today - saw PCP ([place]) 12/20/19 - BMP 03/21/20 reviewed and showed sodium 136, potassium 4.6, creatinine 0.95 and GFR 58  3: DM- - A1c 03/16/20 was 6.1% -glucose at home was 127  4: Atrial fibrillation-  -  recently wore a holter monitor and gets results tomorrow - metoprolol has been stopped due to bradycardia   Patient did not bring her medications nor a list. Each medication was verbally reviewed with the patient and Alexa Lowe was encouraged to bring the bottles to every visit to confirm accuracy of list.  Due to HF stability, will not make a return appointment for patient at this time. Advised patient and her daughter that Alexa Lowe could call back at anytime to make another appointment and they were comfortable with this plan.

## 2020-06-06 ENCOUNTER — Ambulatory Visit: Payer: Medicare HMO | Attending: Family | Admitting: Family

## 2020-06-06 ENCOUNTER — Other Ambulatory Visit: Payer: Self-pay

## 2020-06-06 ENCOUNTER — Encounter: Payer: Self-pay | Admitting: Family

## 2020-06-06 VITALS — BP 125/64 | HR 88 | Resp 16 | Ht 62.0 in | Wt 216.0 lb

## 2020-06-06 DIAGNOSIS — M19011 Primary osteoarthritis, right shoulder: Secondary | ICD-10-CM | POA: Diagnosis not present

## 2020-06-06 DIAGNOSIS — E785 Hyperlipidemia, unspecified: Secondary | ICD-10-CM | POA: Diagnosis not present

## 2020-06-06 DIAGNOSIS — I10 Essential (primary) hypertension: Secondary | ICD-10-CM

## 2020-06-06 DIAGNOSIS — Z881 Allergy status to other antibiotic agents status: Secondary | ICD-10-CM | POA: Diagnosis not present

## 2020-06-06 DIAGNOSIS — I110 Hypertensive heart disease with heart failure: Secondary | ICD-10-CM | POA: Diagnosis not present

## 2020-06-06 DIAGNOSIS — Z7901 Long term (current) use of anticoagulants: Secondary | ICD-10-CM | POA: Insufficient documentation

## 2020-06-06 DIAGNOSIS — Z8249 Family history of ischemic heart disease and other diseases of the circulatory system: Secondary | ICD-10-CM | POA: Diagnosis not present

## 2020-06-06 DIAGNOSIS — Z7984 Long term (current) use of oral hypoglycemic drugs: Secondary | ICD-10-CM | POA: Insufficient documentation

## 2020-06-06 DIAGNOSIS — Z87891 Personal history of nicotine dependence: Secondary | ICD-10-CM | POA: Insufficient documentation

## 2020-06-06 DIAGNOSIS — Z6841 Body Mass Index (BMI) 40.0 and over, adult: Secondary | ICD-10-CM | POA: Diagnosis not present

## 2020-06-06 DIAGNOSIS — Z885 Allergy status to narcotic agent status: Secondary | ICD-10-CM | POA: Diagnosis not present

## 2020-06-06 DIAGNOSIS — E119 Type 2 diabetes mellitus without complications: Secondary | ICD-10-CM | POA: Diagnosis not present

## 2020-06-06 DIAGNOSIS — N182 Chronic kidney disease, stage 2 (mild): Secondary | ICD-10-CM

## 2020-06-06 DIAGNOSIS — Z79899 Other long term (current) drug therapy: Secondary | ICD-10-CM | POA: Diagnosis not present

## 2020-06-06 DIAGNOSIS — R531 Weakness: Secondary | ICD-10-CM | POA: Insufficient documentation

## 2020-06-06 DIAGNOSIS — J449 Chronic obstructive pulmonary disease, unspecified: Secondary | ICD-10-CM | POA: Insufficient documentation

## 2020-06-06 DIAGNOSIS — E1169 Type 2 diabetes mellitus with other specified complication: Secondary | ICD-10-CM | POA: Diagnosis not present

## 2020-06-06 DIAGNOSIS — I4891 Unspecified atrial fibrillation: Secondary | ICD-10-CM | POA: Insufficient documentation

## 2020-06-06 DIAGNOSIS — E1122 Type 2 diabetes mellitus with diabetic chronic kidney disease: Secondary | ICD-10-CM

## 2020-06-06 DIAGNOSIS — I5032 Chronic diastolic (congestive) heart failure: Secondary | ICD-10-CM | POA: Insufficient documentation

## 2020-06-06 NOTE — Patient Instructions (Addendum)
Continue weighing daily and call for an overnight weight gain of > 2 pounds or a weekly weight gain of >5 pounds.   Call us in the future if you'd like to schedule another appointment in the future.  

## 2020-06-12 DIAGNOSIS — E7800 Pure hypercholesterolemia, unspecified: Secondary | ICD-10-CM | POA: Diagnosis not present

## 2020-06-12 DIAGNOSIS — E1169 Type 2 diabetes mellitus with other specified complication: Secondary | ICD-10-CM | POA: Diagnosis not present

## 2020-06-12 DIAGNOSIS — E785 Hyperlipidemia, unspecified: Secondary | ICD-10-CM | POA: Diagnosis not present

## 2020-06-12 DIAGNOSIS — I10 Essential (primary) hypertension: Secondary | ICD-10-CM | POA: Diagnosis not present

## 2020-06-12 DIAGNOSIS — D508 Other iron deficiency anemias: Secondary | ICD-10-CM | POA: Diagnosis not present

## 2020-06-14 DIAGNOSIS — I480 Paroxysmal atrial fibrillation: Secondary | ICD-10-CM | POA: Diagnosis not present

## 2020-06-14 DIAGNOSIS — R001 Bradycardia, unspecified: Secondary | ICD-10-CM | POA: Diagnosis not present

## 2020-06-15 DIAGNOSIS — R001 Bradycardia, unspecified: Secondary | ICD-10-CM | POA: Diagnosis not present

## 2020-06-19 DIAGNOSIS — N1831 Chronic kidney disease, stage 3a: Secondary | ICD-10-CM | POA: Diagnosis not present

## 2020-06-19 DIAGNOSIS — E7800 Pure hypercholesterolemia, unspecified: Secondary | ICD-10-CM | POA: Diagnosis not present

## 2020-06-19 DIAGNOSIS — I129 Hypertensive chronic kidney disease with stage 1 through stage 4 chronic kidney disease, or unspecified chronic kidney disease: Secondary | ICD-10-CM | POA: Diagnosis not present

## 2020-06-19 DIAGNOSIS — E1122 Type 2 diabetes mellitus with diabetic chronic kidney disease: Secondary | ICD-10-CM | POA: Diagnosis not present

## 2020-06-19 DIAGNOSIS — Z6839 Body mass index (BMI) 39.0-39.9, adult: Secondary | ICD-10-CM | POA: Diagnosis not present

## 2020-06-19 DIAGNOSIS — E1169 Type 2 diabetes mellitus with other specified complication: Secondary | ICD-10-CM | POA: Diagnosis not present

## 2020-06-19 DIAGNOSIS — I480 Paroxysmal atrial fibrillation: Secondary | ICD-10-CM | POA: Diagnosis not present

## 2020-06-19 DIAGNOSIS — F32A Depression, unspecified: Secondary | ICD-10-CM | POA: Diagnosis not present

## 2020-07-04 DIAGNOSIS — G250 Essential tremor: Secondary | ICD-10-CM | POA: Diagnosis not present

## 2020-07-19 ENCOUNTER — Encounter: Payer: Self-pay | Admitting: Family

## 2020-07-27 ENCOUNTER — Other Ambulatory Visit: Payer: Self-pay

## 2020-07-27 ENCOUNTER — Ambulatory Visit: Payer: Medicare HMO | Attending: Family Medicine

## 2020-07-27 VITALS — BP 123/93 | HR 87

## 2020-07-27 DIAGNOSIS — R2681 Unsteadiness on feet: Secondary | ICD-10-CM | POA: Diagnosis not present

## 2020-07-27 DIAGNOSIS — M6281 Muscle weakness (generalized): Secondary | ICD-10-CM | POA: Insufficient documentation

## 2020-07-27 DIAGNOSIS — R2689 Other abnormalities of gait and mobility: Secondary | ICD-10-CM | POA: Insufficient documentation

## 2020-07-27 NOTE — Therapy (Signed)
[place] MAIN [facility] SERVICES [address] [place], [place], [address] Phone: [phone]   Fax:  [phone]  Physical Therapy Evaluation  Patient Details  Name: Alexa Lowe MRN: [id number] Date of Birth: 1930-08-13 Referring Provider (PT): Dion Body, MD   Encounter Date: 07/27/2020   PT End of Session - 07/28/20 0818    Visit Number 1    Number of Visits 1    Date for PT Re-Evaluation 07/29/20    PT Start Time 0958    PT Stop Time 1100    PT Time Calculation (min) 62 min    Equipment Utilized During Treatment Gait belt    Activity Tolerance Patient tolerated treatment well    Behavior During Therapy [facility] for tasks assessed/performed           Past Medical History:  Diagnosis Date  . Arrhythmia    atrial fibrillation  . Cancer [facility])    Right breast  . CHF (congestive heart failure) ([place])   . COPD (chronic obstructive pulmonary disease) ([place])   . Diabetes mellitus without complication ([place])   . Hypertension     Past Surgical History:  Procedure Laterality Date  . ABDOMINAL SURGERY    . APPENDECTOMY    . BACK SURGERY    . BREAST SURGERY    . CHOLECYSTECTOMY    . ESOPHAGOGASTRODUODENOSCOPY N/A 08/31/2018   Procedure: ESOPHAGOGASTRODUODENOSCOPY (EGD);  Surgeon: Lin Landsman, MD;  Location: [facility] ENDOSCOPY;  Service: Gastroenterology;  Laterality: N/A;  . ESOPHAGOGASTRODUODENOSCOPY (EGD) WITH PROPOFOL N/A 05/20/2018   Procedure: ESOPHAGOGASTRODUODENOSCOPY (EGD) WITH PROPOFOL;  Surgeon: Toledo, Benay Pike, MD;  Location: [facility] ENDOSCOPY;  Service: Gastroenterology;  Laterality: N/A;  . ESOPHAGOGASTRODUODENOSCOPY (EGD) WITH PROPOFOL N/A 12/17/2018   Procedure: ESOPHAGOGASTRODUODENOSCOPY (EGD) WITH PROPOFOL;  Surgeon: Lin Landsman, MD;  Location: [facility] ENDOSCOPY;  Service: Gastroenterology;  Laterality: N/A;    Vitals:   07/28/20 0805  BP: (!) 123/93  Pulse: 87      Subjective Assessment - 07/27/20 0953     Subjective Pt daughter is present at evaluation. Pt in transport chair. Pt is a pleasant 87 y/o female who reports significant decline in mobility and overall strength over past few months. However, pt reports she has had difficulty with her mobility and strength for past ten years, but that it has recently worsened. She states, "I can't do anything. I haven't been able to do anything for months. I don't have any help." Pt reports both her daughters live out of state and that pt lives alone. One of her daughters drove here today from [facility] for the evaluation but must go back home. Pt says she has a "real hard time" bathing, dressing, cooking/meal prep, and safely performing all ADLs. Pt has a wheelchair but reports it is difficult to use in her home, such that she attempts to briefly ambulate and hold onto furniture to access some rooms, but reports falls. Pt and daughter both report pt frequently falls. They state pt has fallen 20 or more times in the past year. Pt reports recent fall out of her bed and that she has been seen multiple times because of these falls. She says for recent fall her neighbor helped her and called EMS. Pt reports she is in chronic pain in her B shoulders, upper back, and neck. She has had PT before for her shoulder. Pt reports she is unable to have shoulder surgery, "due to my age." She says she was in the hospital for  a month this past December d/t CHF that was diagnosed after she received cortisone shot to her shoulder. The pt says she takes Eliquis so it is dangerous for her to fall and she is very worried about future falls. The pt states she has poor sensation in her LEs and says she recently discovered a cut on her L ankle when she "saw blood." Pt currently has band-aid over area. The pt says since she lives alone, falls frequently, and is otherwise unable to take care of herself she will soon be moving to [facility] in [place], [place].    Patient is accompained by: Family member     Pertinent History Per chart the pt is a 87 y.o. 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 The pt has been referred to PT d/t generalized weakness.    Limitations Sitting;Standing;House hold activities;Lifting;Writing;Walking    How long can you sit comfortably? 30 min-1 hr    How long can you stand comfortably? a few minutes per pt's daughter. Pt states "my knee gives way on me."    How long can you walk comfortably? Pt able to walk a few feet holding onto furniture but has fallen doing this    Diagnostic tests no recent imaging    Patient Stated Goals "I want to get better at walking."    Currently in Pain? Yes    Pain Score 8     Pain Location Shoulder    Pain Orientation Right    Pain Descriptors / Indicators Aching;Sharp;Spasm    Pain Type Chronic pain    Pain Onset More than a month ago    Pain Frequency Constant    Aggravating Factors  moving her shoulder    Pain Relieving Factors resting her shoulder    Effect of Pain on Daily Activities "I can't do nothing. I can't clean my house. I can't cook."    Multiple Pain Sites Yes          EXAMINATION  PAIN: 8/10  POSTURE: Seated in chair pt with R UE in shoulder IR, adduction, elbow flexion (pain positioning). Pt with sacral sitting.   Unable to fully assess standing posture as pt was unable to fully stand even with assist.  PROM/AROM: UE AROM impaired B BLE AROM unable to be fully formally assessed this evaluation  STRENGTH:  Graded on a 0-5 scale  Gross strength in BLEs 3+/5 Unable to assess RUE d/t pain LUE grossly 4/5, abd/flex pain limited   SENSATION: Impaired in BLEs. Pt with cut  covered by bandaid on LLE by ankle, pt reports she did not feel when it happened but saw blood. She says she can't feel very well below her knees.   FUNCTIONAL MOBILITY: Attempted STS, but pt tremulous with CGA x1 and min assist x1. Pt uses LUE to assist stand. Pt was uanble to fully stand with reports of leg pain with attempt.    BALANCE: Fair seated balance Poor standing balance (see above)   GAIT: Unable to assess   OUTCOME MEASURES:  FOTO: 21%  ABC Confidence Scale: 7.5%  Assessment: The pt is a pleasant 87 y/o female referred for PT evaluation for generalized  weakness. Pt has extensive PMH which includes history of CVA, COPD, A-fib, and multiple back surgeries, with reports of chronic pain of multiple joints. Pt presents with significant weakness of BLEs, profound weakness of RUE d/t pain, and decreased strength of LUE. Pt with hx of frequent falls (over 20 in past year), and continues to be a high fall risk. Additionally, pt presents with poor sensation of BLEs with recent wound on LLE (currently covered by band-aid) that pt did not feel occur. The pt was unable to fully perform STS this session even with CGA x1, and min assist x1. ABC scale score (7.5%) and FOTO score (21%) indicate pt has decreased balance confidence with majority of activities and impaired functional mobility. Pt gross BLE strength on MMT was 3+/5, indicating severe strength impairment. Pt impairments are impacting her ability to take care of herself and perform ADLs safely as pt lives alone and does not currently have family nearby to help her. Since pt does not have transportation, and will be moving to [facility] (skilled nursing) in [place], [place] this is a one-time evaluation. PT provided education and handout regarding HEP to perform until pt moves to [facility]. Pt and her daughter verbalized understanding. The pt would benefit from further skilled physical therapy to improve pain, BUE and BLE mobility and  strength, and to improve pt balance and ability to transfer in order to decrease fall risk and increase QOL.    [facility] PT Assessment - 07/27/20 1014      Assessment   Medical Diagnosis Generalized Weakness    Referring Provider (PT) Dion Body, MD    Onset Date/Surgical Date --   10-15 yrs ago   Hand Dominance Right    Next MD Visit nothing scheduled    Prior Therapy yes      Precautions   Precautions Fall   per pt pt instructed not to drive, instructed not to use R arm     Restrictions   Weight Bearing Restrictions --      Balance Screen   Has the patient fallen in the past 6 months Yes    How many times? 20   pt daughter reports she has fallen 4 times in two weeks   Has the patient had a decrease in activity level because of a fear of falling?  Yes    Is the patient reluctant to leave their home because of a fear of falling?  Yes      [place] residence    Living Arrangements Alone    Available Help at Discharge Other (Comment)   daughters live far away   Type of [place] Access Level entry    [place] One level    Home Equipment Wheelchair - power;Grab bars - toilet;Grab bars - tub/shower;Shower seat;Walker - 4 wheels;Walker - 2 wheels;Other (comment)   lift chair     Prior Function   Level of Independence Needs assistance with ADLs    Vocation Retired    Leisure reading, play bingo      Cognition   Overall Cognitive Status Within Functional Limits for tasks assessed             PT Education - 07/28/20 0817    Education Details Pt educated on examination findings, POC, HEP.    Person(s) Educated Patient;Child(ren)    Methods Explanation    Comprehension Verbalized understanding  PT Short Term Goals - 07/28/20 1153      PT SHORT TERM GOAL #1   Title Pt will complete examination to determine need for PT and receive PT at skilled nursing home    Baseline 07/28/2020: Examination complete  and indicates pt is in need of further skilled physical therapy, where pt will receive care at her new residence (skilled nursing home).    Time 1    Period Days    Status Achieved    Target Date 07/28/20             PT Long Term Goals - 02/16/18 1624      PT LONG TERM GOAL #1   Title Patient able to understand recommendations for power wheelchair.     Time 1    Period Days    Status Achieved    Target Date 02/16/18             Plan - 07/28/20 0846    Clinical Impression Statement The pt is a pleasant 87 y/o female referred for PT evaluation for generalized weakness. Pt has extensive PMH which includes history of CVA, COPD, A-fib, and multiple back surgeries, with reports of chronic pain of multiple joints. Pt presents with significant weakness of BLEs, profound weakness of RUE d/t pain, and decreased strength of LUE. Pt with hx of frequent falls (over 20 in past year), and continues to be a high fall risk. Additionally, pt presents with poor sensation of BLEs with recent wound on LLE (currently covered by band-aid) that pt did not feel occur. The pt was unable to fully perform STS this session even with CGA x1, and min assist x1. ABC scale score (7.5%) and FOTO score (21%) indicate pt has decreased balance confidence with majority of activities and impaired functional mobility. Pt gross BLE strength on MMT was 3+/5, indicating severe strength impairment. Pt impairments are impacting her ability to take care of herself and perform ADLs safely as pt lives alone and does not currently have family nearby to help her. Since pt does not have transportation, and will be moving to [facility] (skilled nursing) in [place], [place] this is a one-time evaluation. PT provided education and handout regarding HEP to perform until pt moves to [facility]. Pt and her daughter verbalized understanding. The pt would benefit from further skilled physical therapy to improve pain, BUE and BLE mobility and  strength, and to improve pt balance and ability to transfer in order to decrease fall risk and increase QOL.    Personal Factors and Comorbidities Comorbidity 1;Comorbidity 2;Comorbidity 3+;Past/Current Experience;Fitness;Sex;Age;Social Background;Time since onset of injury/illness/exacerbation;Transportation    Comorbidities per chart and pt: hx of TIA, CVA, COPD, HTN, suspected COVID, chronic LBP, chronic R shoulder pain, a-fib, neuropathy, DMII    Examination-Activity Limitations Bathing;Hygiene/Grooming;Squat;Bed Mobility;Lift;Stairs;Bend;Locomotion Level;Stand;Caring for [employer];Toileting;Carry;Self Feeding;Transfers;Continence;Dressing;Sit    Examination-Participation Restrictions Church;Laundry;Cleaning;Medication Management;Shop;Community Activity;Meal Prep;Yard Work;Driving    Stability/Clinical Decision Making Evolving/Moderate complexity    Clinical Decision Making High    Clinical Presentation due to: hx of frequent falls, genrealized weaknessof BUEs, BLES, chronicity of problem, multiple comorbidities    Rehab Potential Fair    Clinical Impairments Affecting Rehab Potential pt family lives far away, pt lives alone, falls, chronicity, pt is motivated to improve/participate in therapy    PT Frequency One time visit    PT Treatment/Interventions ADLs/Self Care Home Management;Cryotherapy;Moist Heat;Electrical Stimulation;Ultrasound;DME Instruction;Gait training;Stair training;Functional mobility training;Therapeutic activities;Therapeutic exercise;Neuromuscular re-education;Patient/family education;Orthotic Fit/Training;Wheelchair mobility training;Manual techniques;Passive range of motion;Energy conservation  PT Next Visit Plan one time visit    PT Home Exercise Plan Pt provided handout instructing pt in seated marches, seated LAQ    Recommended Other Services Recommend Physical Therapy once pt moves into [facility] and Agree with Plan of Care Patient;Family  member/caregiver    Family Member Consulted pt's daughter           Patient will benefit from skilled therapeutic intervention in order to improve the following deficits and impairments:  Abnormal gait,Decreased balance,Decreased endurance,Decreased mobility,Decreased skin integrity,Difficulty walking,Hypomobility,Increased muscle spasms,Impaired sensation,Decreased range of motion,Dizziness,Improper body mechanics,Obesity,Decreased activity tolerance,Decreased strength,Impaired flexibility,Impaired UE functional use,Postural dysfunction,Pain  Visit Diagnosis: Muscle weakness (generalized)  Other abnormalities of gait and mobility  Unsteadiness on feet     Problem List Patient Active Problem List   Diagnosis Date Noted  . PAF (paroxysmal atrial fibrillation) ([place]) 03/16/2020  . Atrial fibrillation with RVR ([place]) 03/15/2020  . Hyperlipidemia associated with type 2 diabetes mellitus ([place]) 03/15/2020  . History of CVA (cerebrovascular accident) 03/15/2020  . Acute CHF (congestive heart failure) ([place]) 03/15/2020  . Chronic gastric ulcer without hemorrhage and without perforation   . Encephalopathy 08/25/2018  . TIA (transient ischemic attack) 08/24/2018  . Symptomatic anemia 05/19/2018  . Hypertension associated with diabetes ([place]) 05/19/2018  . Diabetes ([place]) 05/19/2018  . COPD (chronic obstructive pulmonary disease) ([place]) 05/19/2018   Ricard Dillon PT, DPT 07/28/2020, 12:00 PM  [place] MAIN [facility] SERVICES [address] [place], [place], [address] Phone: [phone]   Fax:  [phone]  Name: Alexa Lowe MRN: [id number] Date of Birth: 01-15-1931

## 2020-08-02 ENCOUNTER — Ambulatory Visit: Payer: Medicare HMO

## 2020-08-04 DIAGNOSIS — R531 Weakness: Secondary | ICD-10-CM | POA: Diagnosis not present

## 2020-08-04 DIAGNOSIS — G250 Essential tremor: Secondary | ICD-10-CM | POA: Diagnosis not present

## 2020-08-04 DIAGNOSIS — R278 Other lack of coordination: Secondary | ICD-10-CM | POA: Diagnosis not present

## 2020-08-04 DIAGNOSIS — M545 Low back pain, unspecified: Secondary | ICD-10-CM | POA: Diagnosis not present

## 2020-08-04 DIAGNOSIS — E1140 Type 2 diabetes mellitus with diabetic neuropathy, unspecified: Secondary | ICD-10-CM | POA: Diagnosis not present

## 2020-08-04 DIAGNOSIS — M6281 Muscle weakness (generalized): Secondary | ICD-10-CM | POA: Diagnosis not present

## 2020-08-04 DIAGNOSIS — I4891 Unspecified atrial fibrillation: Secondary | ICD-10-CM | POA: Diagnosis not present

## 2020-08-07 DIAGNOSIS — I4891 Unspecified atrial fibrillation: Secondary | ICD-10-CM | POA: Diagnosis not present

## 2020-08-07 DIAGNOSIS — R278 Other lack of coordination: Secondary | ICD-10-CM | POA: Diagnosis not present

## 2020-08-07 DIAGNOSIS — M6281 Muscle weakness (generalized): Secondary | ICD-10-CM | POA: Diagnosis not present

## 2020-08-08 ENCOUNTER — Ambulatory Visit: Payer: Medicare HMO

## 2020-08-08 DIAGNOSIS — E559 Vitamin D deficiency, unspecified: Secondary | ICD-10-CM | POA: Diagnosis not present

## 2020-08-08 DIAGNOSIS — I4891 Unspecified atrial fibrillation: Secondary | ICD-10-CM | POA: Diagnosis not present

## 2020-08-08 DIAGNOSIS — R278 Other lack of coordination: Secondary | ICD-10-CM | POA: Diagnosis not present

## 2020-08-08 DIAGNOSIS — E119 Type 2 diabetes mellitus without complications: Secondary | ICD-10-CM | POA: Diagnosis not present

## 2020-08-08 DIAGNOSIS — I509 Heart failure, unspecified: Secondary | ICD-10-CM | POA: Diagnosis not present

## 2020-08-08 DIAGNOSIS — M6281 Muscle weakness (generalized): Secondary | ICD-10-CM | POA: Diagnosis not present

## 2020-08-08 DIAGNOSIS — E785 Hyperlipidemia, unspecified: Secondary | ICD-10-CM | POA: Diagnosis not present

## 2020-08-08 DIAGNOSIS — D519 Vitamin B12 deficiency anemia, unspecified: Secondary | ICD-10-CM | POA: Diagnosis not present

## 2020-08-08 DIAGNOSIS — D509 Iron deficiency anemia, unspecified: Secondary | ICD-10-CM | POA: Diagnosis not present

## 2020-08-09 DIAGNOSIS — M6281 Muscle weakness (generalized): Secondary | ICD-10-CM | POA: Diagnosis not present

## 2020-08-09 DIAGNOSIS — R278 Other lack of coordination: Secondary | ICD-10-CM | POA: Diagnosis not present

## 2020-08-09 DIAGNOSIS — I4891 Unspecified atrial fibrillation: Secondary | ICD-10-CM | POA: Diagnosis not present

## 2020-08-10 ENCOUNTER — Ambulatory Visit: Payer: Medicare HMO

## 2020-08-10 DIAGNOSIS — M6281 Muscle weakness (generalized): Secondary | ICD-10-CM | POA: Diagnosis not present

## 2020-08-10 DIAGNOSIS — I4891 Unspecified atrial fibrillation: Secondary | ICD-10-CM | POA: Diagnosis not present

## 2020-08-10 DIAGNOSIS — R278 Other lack of coordination: Secondary | ICD-10-CM | POA: Diagnosis not present

## 2020-08-11 DIAGNOSIS — M6281 Muscle weakness (generalized): Secondary | ICD-10-CM | POA: Diagnosis not present

## 2020-08-11 DIAGNOSIS — I4891 Unspecified atrial fibrillation: Secondary | ICD-10-CM | POA: Diagnosis not present

## 2020-08-11 DIAGNOSIS — R278 Other lack of coordination: Secondary | ICD-10-CM | POA: Diagnosis not present

## 2020-08-15 ENCOUNTER — Ambulatory Visit: Payer: Medicare HMO

## 2020-08-15 DIAGNOSIS — M6281 Muscle weakness (generalized): Secondary | ICD-10-CM | POA: Diagnosis not present

## 2020-08-15 DIAGNOSIS — R278 Other lack of coordination: Secondary | ICD-10-CM | POA: Diagnosis not present

## 2020-08-15 DIAGNOSIS — I4891 Unspecified atrial fibrillation: Secondary | ICD-10-CM | POA: Diagnosis not present

## 2020-08-16 DIAGNOSIS — I4891 Unspecified atrial fibrillation: Secondary | ICD-10-CM | POA: Diagnosis not present

## 2020-08-16 DIAGNOSIS — M6281 Muscle weakness (generalized): Secondary | ICD-10-CM | POA: Diagnosis not present

## 2020-08-16 DIAGNOSIS — R278 Other lack of coordination: Secondary | ICD-10-CM | POA: Diagnosis not present

## 2020-08-16 DIAGNOSIS — I5032 Chronic diastolic (congestive) heart failure: Secondary | ICD-10-CM | POA: Diagnosis not present

## 2020-08-16 DIAGNOSIS — E1149 Type 2 diabetes mellitus with other diabetic neurological complication: Secondary | ICD-10-CM | POA: Diagnosis not present

## 2020-08-16 DIAGNOSIS — M25512 Pain in left shoulder: Secondary | ICD-10-CM | POA: Diagnosis not present

## 2020-08-16 DIAGNOSIS — M25511 Pain in right shoulder: Secondary | ICD-10-CM | POA: Diagnosis not present

## 2020-08-17 ENCOUNTER — Ambulatory Visit: Payer: Medicare HMO

## 2020-08-18 DIAGNOSIS — R278 Other lack of coordination: Secondary | ICD-10-CM | POA: Diagnosis not present

## 2020-08-18 DIAGNOSIS — M6281 Muscle weakness (generalized): Secondary | ICD-10-CM | POA: Diagnosis not present

## 2020-08-18 DIAGNOSIS — I4891 Unspecified atrial fibrillation: Secondary | ICD-10-CM | POA: Diagnosis not present

## 2020-08-22 ENCOUNTER — Ambulatory Visit: Payer: Medicare HMO

## 2020-08-22 DIAGNOSIS — I4891 Unspecified atrial fibrillation: Secondary | ICD-10-CM | POA: Diagnosis not present

## 2020-08-22 DIAGNOSIS — R278 Other lack of coordination: Secondary | ICD-10-CM | POA: Diagnosis not present

## 2020-08-22 DIAGNOSIS — M6281 Muscle weakness (generalized): Secondary | ICD-10-CM | POA: Diagnosis not present

## 2020-08-23 DIAGNOSIS — M6281 Muscle weakness (generalized): Secondary | ICD-10-CM | POA: Diagnosis not present

## 2020-08-23 DIAGNOSIS — I4891 Unspecified atrial fibrillation: Secondary | ICD-10-CM | POA: Diagnosis not present

## 2020-08-23 DIAGNOSIS — R278 Other lack of coordination: Secondary | ICD-10-CM | POA: Diagnosis not present

## 2020-08-24 ENCOUNTER — Ambulatory Visit: Payer: Medicare HMO

## 2020-08-29 DIAGNOSIS — I4891 Unspecified atrial fibrillation: Secondary | ICD-10-CM | POA: Diagnosis not present

## 2020-08-29 DIAGNOSIS — M6281 Muscle weakness (generalized): Secondary | ICD-10-CM | POA: Diagnosis not present

## 2020-08-29 DIAGNOSIS — R278 Other lack of coordination: Secondary | ICD-10-CM | POA: Diagnosis not present

## 2020-08-30 ENCOUNTER — Ambulatory Visit: Payer: Medicare HMO

## 2020-08-30 DIAGNOSIS — I5033 Acute on chronic diastolic (congestive) heart failure: Secondary | ICD-10-CM | POA: Diagnosis not present

## 2020-08-30 DIAGNOSIS — M6281 Muscle weakness (generalized): Secondary | ICD-10-CM | POA: Diagnosis not present

## 2020-08-30 DIAGNOSIS — I480 Paroxysmal atrial fibrillation: Secondary | ICD-10-CM | POA: Diagnosis not present

## 2020-08-30 DIAGNOSIS — R278 Other lack of coordination: Secondary | ICD-10-CM | POA: Diagnosis not present

## 2020-08-30 DIAGNOSIS — I4891 Unspecified atrial fibrillation: Secondary | ICD-10-CM | POA: Diagnosis not present

## 2020-08-31 DIAGNOSIS — M6281 Muscle weakness (generalized): Secondary | ICD-10-CM | POA: Diagnosis not present

## 2020-08-31 DIAGNOSIS — I4891 Unspecified atrial fibrillation: Secondary | ICD-10-CM | POA: Diagnosis not present

## 2020-08-31 DIAGNOSIS — R278 Other lack of coordination: Secondary | ICD-10-CM | POA: Diagnosis not present

## 2020-09-01 DIAGNOSIS — I5033 Acute on chronic diastolic (congestive) heart failure: Secondary | ICD-10-CM | POA: Diagnosis not present

## 2020-09-01 DIAGNOSIS — R635 Abnormal weight gain: Secondary | ICD-10-CM | POA: Diagnosis not present

## 2020-09-05 ENCOUNTER — Ambulatory Visit: Payer: Medicare HMO

## 2020-09-05 DIAGNOSIS — I4891 Unspecified atrial fibrillation: Secondary | ICD-10-CM | POA: Diagnosis not present

## 2020-09-05 DIAGNOSIS — E785 Hyperlipidemia, unspecified: Secondary | ICD-10-CM | POA: Diagnosis not present

## 2020-09-05 DIAGNOSIS — I5033 Acute on chronic diastolic (congestive) heart failure: Secondary | ICD-10-CM | POA: Diagnosis not present

## 2020-09-05 DIAGNOSIS — R635 Abnormal weight gain: Secondary | ICD-10-CM | POA: Diagnosis not present

## 2020-09-05 DIAGNOSIS — I10 Essential (primary) hypertension: Secondary | ICD-10-CM | POA: Diagnosis not present

## 2020-09-05 DIAGNOSIS — N1832 Chronic kidney disease, stage 3b: Secondary | ICD-10-CM | POA: Diagnosis not present

## 2020-09-05 DIAGNOSIS — I509 Heart failure, unspecified: Secondary | ICD-10-CM | POA: Diagnosis not present

## 2020-09-06 DIAGNOSIS — M79671 Pain in right foot: Secondary | ICD-10-CM | POA: Diagnosis not present

## 2020-09-06 DIAGNOSIS — S9031XA Contusion of right foot, initial encounter: Secondary | ICD-10-CM | POA: Diagnosis not present

## 2020-09-07 ENCOUNTER — Ambulatory Visit: Payer: Medicare HMO

## 2020-09-12 ENCOUNTER — Ambulatory Visit: Payer: Medicare HMO

## 2020-09-14 ENCOUNTER — Ambulatory Visit: Payer: Medicare HMO

## 2020-09-19 ENCOUNTER — Ambulatory Visit: Payer: Medicare HMO

## 2020-09-21 ENCOUNTER — Ambulatory Visit: Payer: Medicare HMO

## 2020-09-26 ENCOUNTER — Ambulatory Visit: Payer: Medicare HMO

## 2020-09-26 DIAGNOSIS — E1140 Type 2 diabetes mellitus with diabetic neuropathy, unspecified: Secondary | ICD-10-CM | POA: Diagnosis not present

## 2020-09-26 DIAGNOSIS — G250 Essential tremor: Secondary | ICD-10-CM | POA: Diagnosis not present

## 2020-09-26 DIAGNOSIS — I480 Paroxysmal atrial fibrillation: Secondary | ICD-10-CM | POA: Diagnosis not present

## 2020-09-26 DIAGNOSIS — F325 Major depressive disorder, single episode, in full remission: Secondary | ICD-10-CM | POA: Diagnosis not present

## 2020-09-26 DIAGNOSIS — I5032 Chronic diastolic (congestive) heart failure: Secondary | ICD-10-CM | POA: Diagnosis not present

## 2020-09-26 DIAGNOSIS — G4700 Insomnia, unspecified: Secondary | ICD-10-CM | POA: Diagnosis not present

## 2020-09-26 DIAGNOSIS — N183 Chronic kidney disease, stage 3 unspecified: Secondary | ICD-10-CM | POA: Diagnosis not present

## 2020-09-26 DIAGNOSIS — M545 Low back pain, unspecified: Secondary | ICD-10-CM | POA: Diagnosis not present

## 2020-09-26 DIAGNOSIS — K219 Gastro-esophageal reflux disease without esophagitis: Secondary | ICD-10-CM | POA: Diagnosis not present

## 2020-09-28 ENCOUNTER — Ambulatory Visit: Payer: Medicare HMO

## 2020-10-02 DIAGNOSIS — J449 Chronic obstructive pulmonary disease, unspecified: Secondary | ICD-10-CM | POA: Diagnosis not present

## 2020-10-02 DIAGNOSIS — I10 Essential (primary) hypertension: Secondary | ICD-10-CM | POA: Diagnosis not present

## 2020-10-02 DIAGNOSIS — I509 Heart failure, unspecified: Secondary | ICD-10-CM | POA: Diagnosis not present

## 2020-10-02 DIAGNOSIS — E119 Type 2 diabetes mellitus without complications: Secondary | ICD-10-CM | POA: Diagnosis not present

## 2020-10-03 ENCOUNTER — Ambulatory Visit: Payer: Medicare HMO

## 2020-10-05 ENCOUNTER — Ambulatory Visit: Payer: Medicare HMO

## 2020-10-16 DIAGNOSIS — M19011 Primary osteoarthritis, right shoulder: Secondary | ICD-10-CM | POA: Diagnosis not present

## 2020-10-16 DIAGNOSIS — Z981 Arthrodesis status: Secondary | ICD-10-CM | POA: Diagnosis not present

## 2020-10-16 DIAGNOSIS — M5412 Radiculopathy, cervical region: Secondary | ICD-10-CM | POA: Diagnosis not present

## 2020-10-16 DIAGNOSIS — E669 Obesity, unspecified: Secondary | ICD-10-CM | POA: Diagnosis not present

## 2020-10-16 DIAGNOSIS — E1169 Type 2 diabetes mellitus with other specified complication: Secondary | ICD-10-CM | POA: Diagnosis not present

## 2020-10-16 DIAGNOSIS — E785 Hyperlipidemia, unspecified: Secondary | ICD-10-CM | POA: Diagnosis not present

## 2020-10-20 DIAGNOSIS — I509 Heart failure, unspecified: Secondary | ICD-10-CM | POA: Diagnosis not present

## 2020-10-20 DIAGNOSIS — I10 Essential (primary) hypertension: Secondary | ICD-10-CM | POA: Diagnosis not present

## 2020-10-20 DIAGNOSIS — R2243 Localized swelling, mass and lump, lower limb, bilateral: Secondary | ICD-10-CM | POA: Diagnosis not present

## 2020-10-20 DIAGNOSIS — J449 Chronic obstructive pulmonary disease, unspecified: Secondary | ICD-10-CM | POA: Diagnosis not present

## 2020-10-25 DIAGNOSIS — I5032 Chronic diastolic (congestive) heart failure: Secondary | ICD-10-CM | POA: Diagnosis not present

## 2020-10-25 DIAGNOSIS — M5459 Other low back pain: Secondary | ICD-10-CM | POA: Diagnosis not present

## 2020-10-25 DIAGNOSIS — K219 Gastro-esophageal reflux disease without esophagitis: Secondary | ICD-10-CM | POA: Diagnosis not present

## 2020-10-25 DIAGNOSIS — R531 Weakness: Secondary | ICD-10-CM | POA: Diagnosis not present

## 2020-10-25 DIAGNOSIS — F325 Major depressive disorder, single episode, in full remission: Secondary | ICD-10-CM | POA: Diagnosis not present

## 2020-10-25 DIAGNOSIS — I480 Paroxysmal atrial fibrillation: Secondary | ICD-10-CM | POA: Diagnosis not present

## 2020-10-25 DIAGNOSIS — E1149 Type 2 diabetes mellitus with other diabetic neurological complication: Secondary | ICD-10-CM | POA: Diagnosis not present

## 2020-10-25 DIAGNOSIS — G250 Essential tremor: Secondary | ICD-10-CM | POA: Diagnosis not present

## 2020-10-25 DIAGNOSIS — N1832 Chronic kidney disease, stage 3b: Secondary | ICD-10-CM | POA: Diagnosis not present

## 2020-10-30 DIAGNOSIS — I509 Heart failure, unspecified: Secondary | ICD-10-CM | POA: Diagnosis not present

## 2020-10-30 DIAGNOSIS — J449 Chronic obstructive pulmonary disease, unspecified: Secondary | ICD-10-CM | POA: Diagnosis not present

## 2020-10-31 DIAGNOSIS — R42 Dizziness and giddiness: Secondary | ICD-10-CM | POA: Diagnosis not present

## 2020-10-31 DIAGNOSIS — R059 Cough, unspecified: Secondary | ICD-10-CM | POA: Diagnosis not present

## 2020-10-31 DIAGNOSIS — S51019A Laceration without foreign body of unspecified elbow, initial encounter: Secondary | ICD-10-CM | POA: Diagnosis not present

## 2020-10-31 DIAGNOSIS — R0781 Pleurodynia: Secondary | ICD-10-CM | POA: Diagnosis not present

## 2020-10-31 DIAGNOSIS — G250 Essential tremor: Secondary | ICD-10-CM | POA: Diagnosis not present

## 2020-11-01 DIAGNOSIS — K219 Gastro-esophageal reflux disease without esophagitis: Secondary | ICD-10-CM | POA: Diagnosis not present

## 2020-11-01 DIAGNOSIS — R262 Difficulty in walking, not elsewhere classified: Secondary | ICD-10-CM | POA: Diagnosis not present

## 2020-11-01 DIAGNOSIS — I4891 Unspecified atrial fibrillation: Secondary | ICD-10-CM | POA: Diagnosis not present

## 2020-11-01 DIAGNOSIS — R1013 Epigastric pain: Secondary | ICD-10-CM | POA: Diagnosis not present

## 2020-11-01 DIAGNOSIS — R279 Unspecified lack of coordination: Secondary | ICD-10-CM | POA: Diagnosis not present

## 2020-11-02 DIAGNOSIS — I4891 Unspecified atrial fibrillation: Secondary | ICD-10-CM | POA: Diagnosis not present

## 2020-11-02 DIAGNOSIS — R262 Difficulty in walking, not elsewhere classified: Secondary | ICD-10-CM | POA: Diagnosis not present

## 2020-11-02 DIAGNOSIS — R279 Unspecified lack of coordination: Secondary | ICD-10-CM | POA: Diagnosis not present

## 2020-11-03 DIAGNOSIS — R279 Unspecified lack of coordination: Secondary | ICD-10-CM | POA: Diagnosis not present

## 2020-11-03 DIAGNOSIS — R262 Difficulty in walking, not elsewhere classified: Secondary | ICD-10-CM | POA: Diagnosis not present

## 2020-11-03 DIAGNOSIS — I4891 Unspecified atrial fibrillation: Secondary | ICD-10-CM | POA: Diagnosis not present

## 2021-04-07 IMAGING — CT CT HEAD WITHOUT CONTRAST
3 series · 16 of 46 positions shown, 19 images · non-contrast
Comparison: 10/28/2014

CLINICAL DATA: Confusion

EXAM:
CT HEAD WITHOUT CONTRAST
TECHNIQUE: Contiguous axial images were obtained from the base of the skull
through the vertex without intravenous contrast.

[Series 3: head wo · axial · 0.39mm/px · z∈[+340,+460]mm · 10 of 29 slices shown, 13 images]
[im 3/29  brain]
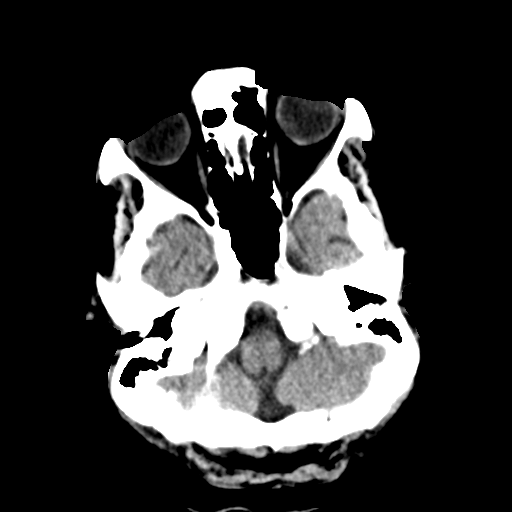
[im 3/29  bone]
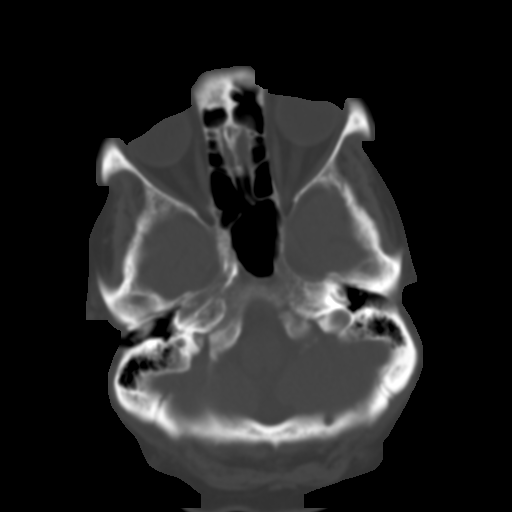
[im 6/29  brain]
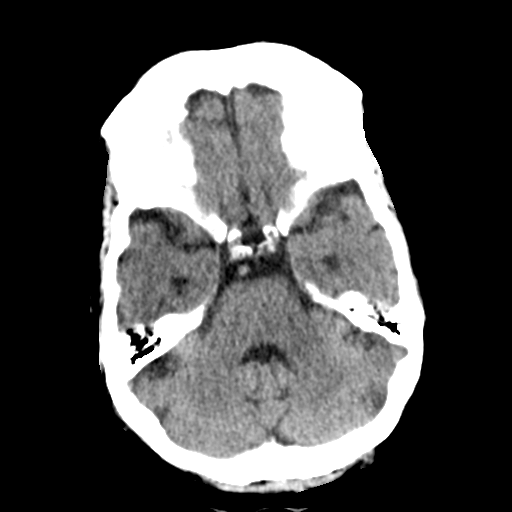
[im 8/29  brain]
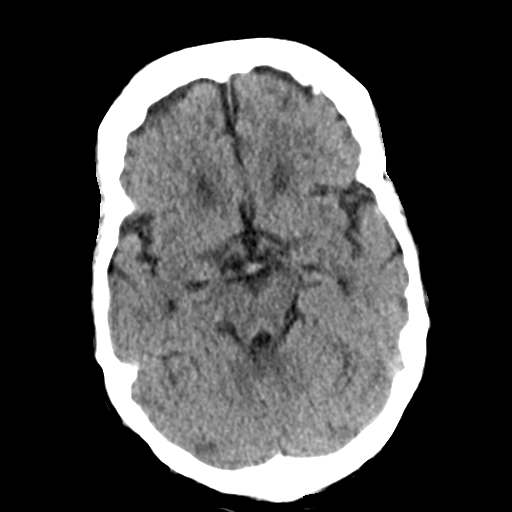
[im 11/29  brain]
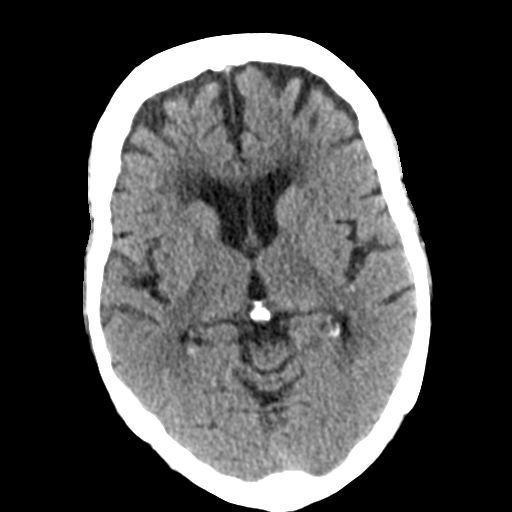
[im 14/29  brain]
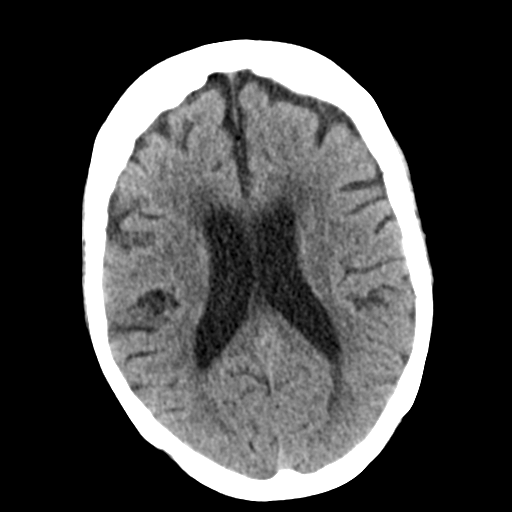
[im 14/29  bone]
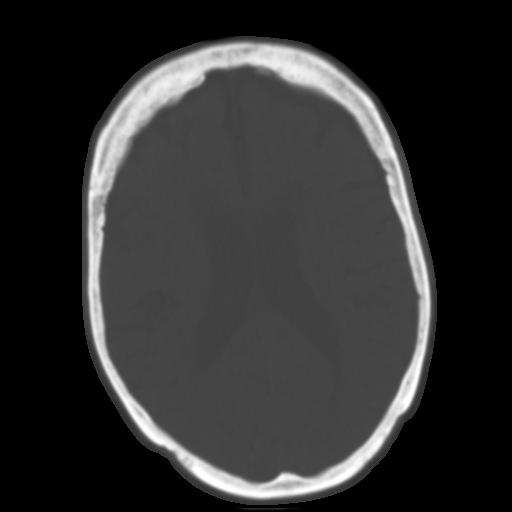
[im 16/29  brain]
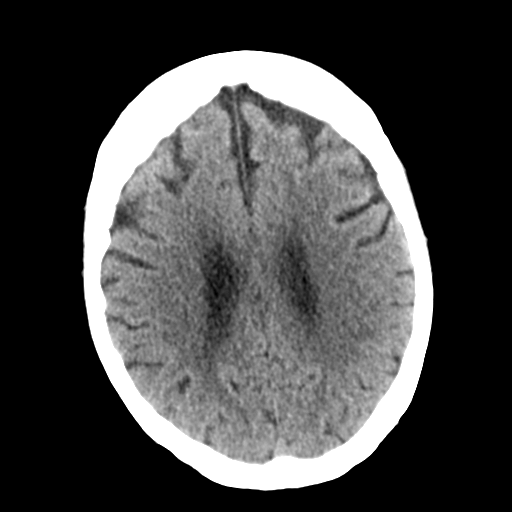
[im 19/29  brain]
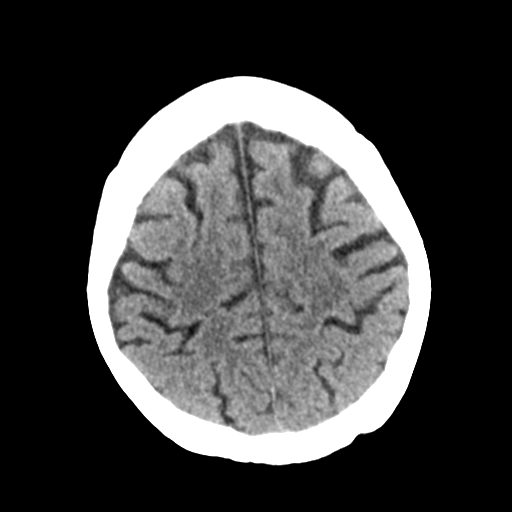
[im 22/29  brain]
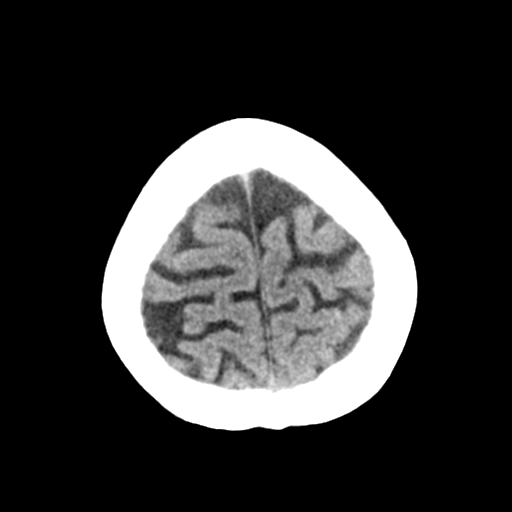
[im 24/29  brain]
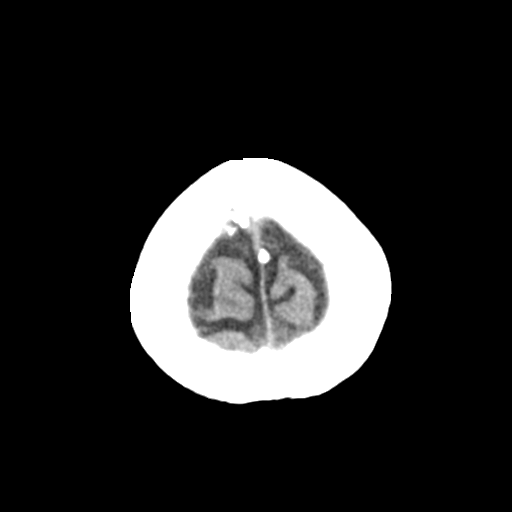
[im 24/29  bone]
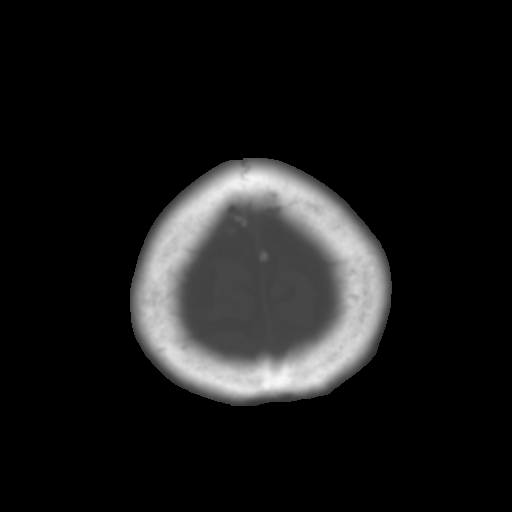
[im 27/29  brain]
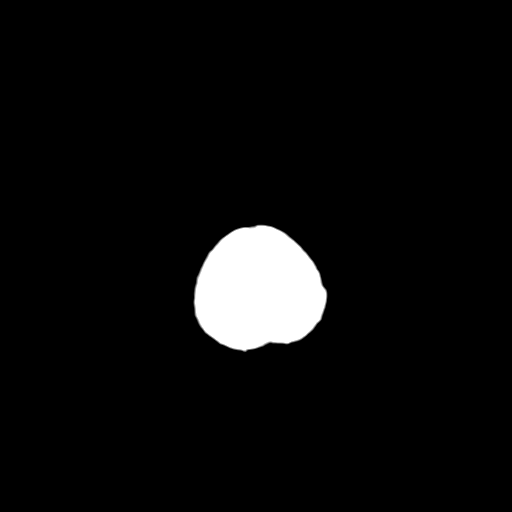

[Series 4: coronal soft tissue · coronal · 0.26mm/px · 3 of 63 slices shown]
[im 21/63  brain]
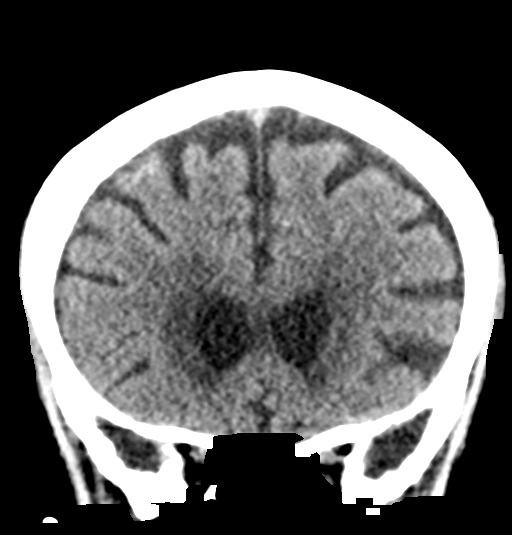
[im 28/63  brain]
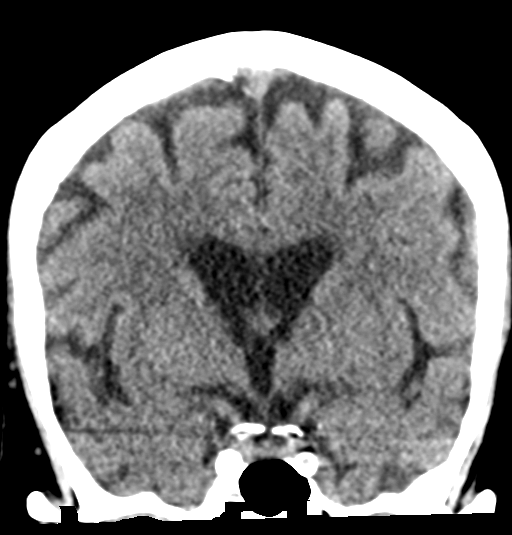
[im 35/63  brain]
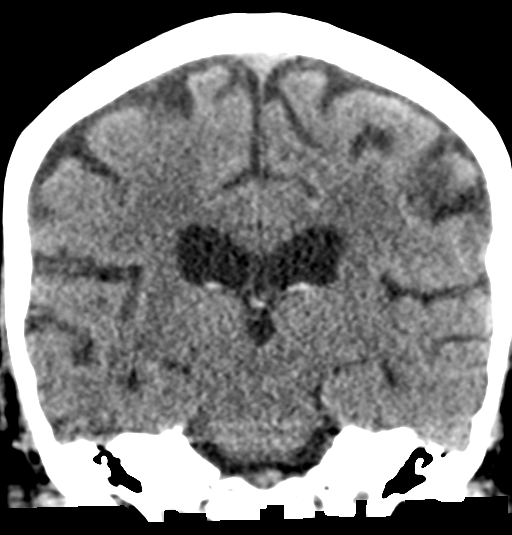

[Series 5: sagittal soft tissue · sagittal · 0.28mm/px · 3 of 46 slices shown]
[im 16/46  brain]
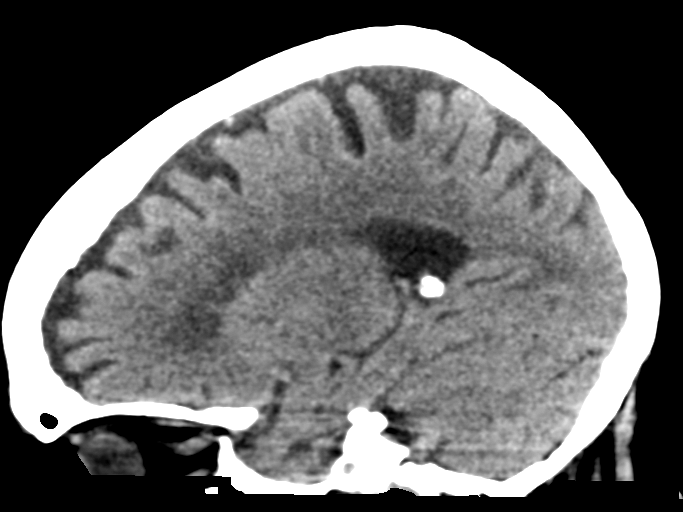
[im 23/46  brain]
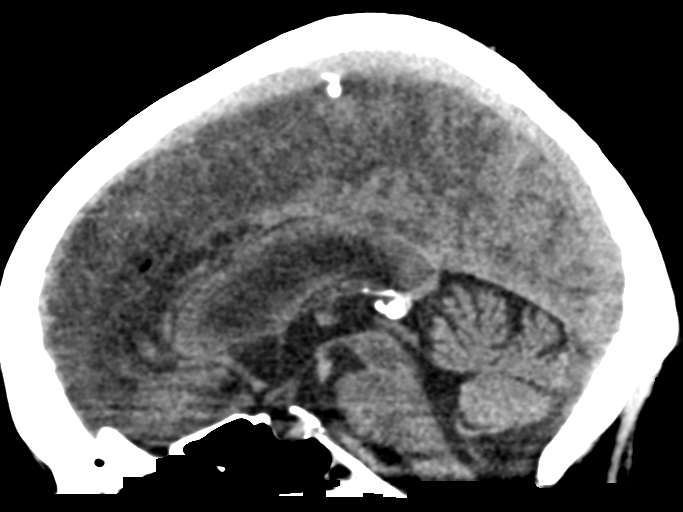
[im 31/46  brain]
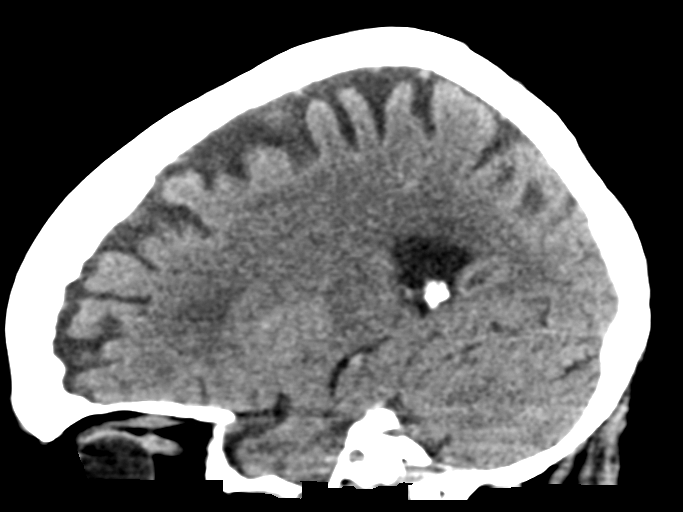

[16 of 46 positions shown; findings below may reference images not displayed]

FINDINGS: Brain: No evidence of acute infarction, hemorrhage, hydrocephalus,
extra-axial collection or mass lesion/mass effect. Periventricular
white matter hypodensity.

Vascular: No hyperdense vessel or unexpected calcification.

Skull: Normal. Negative for fracture or focal lesion.

Sinuses/Orbits: No acute finding.

Other: None.
IMPRESSION: No acute intracranial pathology.  Small-vessel white matter disease.

## 2023-11-07 DEATH — deceased
